# Patient Record
Sex: Male | Born: 1935 | Race: Black or African American | Hispanic: No | Marital: Married | Smoking: Current every day smoker
Health system: Southern US, Community
[De-identification: ages and names within clinical notes are randomized; demographics above are authoritative.]

## PROBLEM LIST (undated history)

## (undated) DIAGNOSIS — N183 Chronic kidney disease, stage 3 unspecified: Secondary | ICD-10-CM

## (undated) DIAGNOSIS — I714 Abdominal aortic aneurysm, without rupture, unspecified: Secondary | ICD-10-CM

## (undated) DIAGNOSIS — I10 Essential (primary) hypertension: Secondary | ICD-10-CM

## (undated) DIAGNOSIS — T148XXA Other injury of unspecified body region, initial encounter: Secondary | ICD-10-CM

## (undated) DIAGNOSIS — D649 Anemia, unspecified: Secondary | ICD-10-CM

## (undated) DIAGNOSIS — K219 Gastro-esophageal reflux disease without esophagitis: Secondary | ICD-10-CM

## (undated) DIAGNOSIS — R0602 Shortness of breath: Secondary | ICD-10-CM

## (undated) DIAGNOSIS — K639 Disease of intestine, unspecified: Secondary | ICD-10-CM

## (undated) DIAGNOSIS — D509 Iron deficiency anemia, unspecified: Principal | ICD-10-CM

## (undated) DIAGNOSIS — L0390 Cellulitis, unspecified: Secondary | ICD-10-CM

## (undated) DIAGNOSIS — K769 Liver disease, unspecified: Secondary | ICD-10-CM

## (undated) DIAGNOSIS — R55 Syncope and collapse: Secondary | ICD-10-CM

## (undated) DIAGNOSIS — Z9289 Personal history of other medical treatment: Secondary | ICD-10-CM

## (undated) DIAGNOSIS — J449 Chronic obstructive pulmonary disease, unspecified: Secondary | ICD-10-CM

## (undated) DIAGNOSIS — J189 Pneumonia, unspecified organism: Secondary | ICD-10-CM

## (undated) DIAGNOSIS — I739 Peripheral vascular disease, unspecified: Secondary | ICD-10-CM

## (undated) DIAGNOSIS — D696 Thrombocytopenia, unspecified: Secondary | ICD-10-CM

## (undated) DIAGNOSIS — E785 Hyperlipidemia, unspecified: Secondary | ICD-10-CM

## (undated) HISTORY — DX: Abdominal aortic aneurysm, without rupture, unspecified: I71.40

## (undated) HISTORY — DX: Hyperlipidemia, unspecified: E78.5

## (undated) HISTORY — DX: Thrombocytopenia, unspecified: D69.6

## (undated) HISTORY — DX: Abdominal aortic aneurysm, without rupture: I71.4

## (undated) HISTORY — DX: Anemia, unspecified: D64.9

## (undated) HISTORY — PX: PR VEIN BYPASS GRAFT,AORTO-FEM-POP: 35551

## (undated) HISTORY — DX: Iron deficiency anemia, unspecified: D50.9

---

## 1959-02-27 DIAGNOSIS — K769 Liver disease, unspecified: Secondary | ICD-10-CM

## 1959-02-27 HISTORY — DX: Liver disease, unspecified: K76.9

## 2011-03-08 ENCOUNTER — Inpatient Hospital Stay (HOSPITAL_COMMUNITY)
Admission: EM | Admit: 2011-03-08 | Discharge: 2011-03-18 | DRG: 560 | Disposition: A | Discharge status: Home / Self Care | Payer: BC Managed Care – PPO | Attending: Family Medicine | Admitting: Family Medicine

## 2011-03-08 ENCOUNTER — Emergency Department (HOSPITAL_COMMUNITY): Payer: BC Managed Care – PPO

## 2011-03-08 ENCOUNTER — Other Ambulatory Visit (HOSPITAL_COMMUNITY): Payer: Self-pay

## 2011-03-08 ENCOUNTER — Encounter (HOSPITAL_COMMUNITY): Payer: Self-pay | Admitting: Radiology

## 2011-03-08 DIAGNOSIS — I70219 Atherosclerosis of native arteries of extremities with intermittent claudication, unspecified extremity: Secondary | ICD-10-CM | POA: Diagnosis present

## 2011-03-08 DIAGNOSIS — I723 Aneurysm of iliac artery: Secondary | ICD-10-CM | POA: Diagnosis present

## 2011-03-08 DIAGNOSIS — D61818 Other pancytopenia: Secondary | ICD-10-CM | POA: Diagnosis present

## 2011-03-08 DIAGNOSIS — N179 Acute kidney failure, unspecified: Secondary | ICD-10-CM | POA: Diagnosis not present

## 2011-03-08 DIAGNOSIS — F17200 Nicotine dependence, unspecified, uncomplicated: Secondary | ICD-10-CM | POA: Diagnosis present

## 2011-03-08 DIAGNOSIS — N182 Chronic kidney disease, stage 2 (mild): Secondary | ICD-10-CM | POA: Diagnosis present

## 2011-03-08 DIAGNOSIS — I129 Hypertensive chronic kidney disease with stage 1 through stage 4 chronic kidney disease, or unspecified chronic kidney disease: Secondary | ICD-10-CM | POA: Diagnosis present

## 2011-03-08 DIAGNOSIS — M7981 Nontraumatic hematoma of soft tissue: Principal | ICD-10-CM | POA: Diagnosis present

## 2011-03-08 DIAGNOSIS — I714 Abdominal aortic aneurysm, without rupture, unspecified: Secondary | ICD-10-CM | POA: Diagnosis present

## 2011-03-08 DIAGNOSIS — IMO0002 Reserved for concepts with insufficient information to code with codable children: Secondary | ICD-10-CM | POA: Diagnosis present

## 2011-03-08 LAB — POCT I-STAT, CHEM 8
BUN: 20 mg/dL (ref 6–23)
Calcium, Ion: 1.18 mmol/L (ref 1.12–1.32)
Chloride: 108 mEq/L (ref 96–112)
Creatinine, Ser: 1.4 mg/dL — ABNORMAL HIGH (ref 0.50–1.35)
Glucose, Bld: 85 mg/dL (ref 70–99)
HCT: 37 % — ABNORMAL LOW (ref 39.0–52.0)
Hemoglobin: 12.6 g/dL — ABNORMAL LOW (ref 13.0–17.0)
Potassium: 3.8 meq/L (ref 3.5–5.1)
Sodium: 142 meq/L (ref 135–145)
TCO2: 22 mmol/L (ref 0–100)

## 2011-03-08 LAB — BASIC METABOLIC PANEL WITH GFR
CO2: 23 meq/L (ref 19–32)
Chloride: 105 meq/L (ref 96–112)
Glucose, Bld: 81 mg/dL (ref 70–99)
Potassium: 3.9 meq/L (ref 3.5–5.1)
Sodium: 141 meq/L (ref 135–145)

## 2011-03-08 LAB — BASIC METABOLIC PANEL
BUN: 18 mg/dL (ref 6–23)
Calcium: 9.7 mg/dL (ref 8.4–10.5)
Creatinine, Ser: 1.25 mg/dL (ref 0.50–1.35)
GFR calc Af Amer: >60 mL/min (ref >60)
GFR calc non Af Amer: 57 mL/min — ABNORMAL LOW (ref >60)

## 2011-03-08 LAB — CBC
HCT: 30.8 % — ABNORMAL LOW (ref 39.0–52.0)
Hemoglobin: 11 g/dL — ABNORMAL LOW (ref 13.0–17.0)
MCH: 28.7 pg (ref 26.0–34.0)
MCHC: 35.7 g/dL (ref 30.0–36.0)
MCV: 80.4 fL (ref 78.0–100.0)
Platelets: 100 10*3/uL — ABNORMAL LOW (ref 150–400)
RBC: 3.83 MIL/uL — ABNORMAL LOW (ref 4.22–5.81)
RDW: 13.2 % (ref 11.5–15.5)
WBC: 6.8 K/uL (ref 4.0–10.5)

## 2011-03-08 LAB — DIFFERENTIAL
Basophils Absolute: 0 K/uL (ref 0.0–0.1)
Basophils Relative: 0 % (ref 0–1)
Eosinophils Absolute: 0.1 10*3/uL (ref 0.0–0.7)
Eosinophils Relative: 1 % (ref 0–5)
Lymphocytes Relative: 31 % (ref 12–46)
Lymphs Abs: 2.1 10*3/uL (ref 0.7–4.0)
Monocytes Absolute: 0.6 K/uL (ref 0.1–1.0)
Monocytes Relative: 9 % (ref 3–12)
Neutro Abs: 3.9 K/uL (ref 1.7–7.7)
Neutrophils Relative %: 58 % (ref 43–77)

## 2011-03-08 LAB — SEDIMENTATION RATE: Sed Rate: 29 mm/hr — ABNORMAL HIGH (ref 0–16)

## 2011-03-08 MED ORDER — IOHEXOL 300 MG/ML  SOLN
100.0000 mL | Freq: Once | INTRAMUSCULAR | Status: AC | PRN
  Administered 2011-03-08: 100 mL via INTRAVENOUS

## 2011-03-09 ENCOUNTER — Inpatient Hospital Stay (HOSPITAL_COMMUNITY): Payer: BC Managed Care – PPO

## 2011-03-09 LAB — CBC
MCH: 28.2 pg (ref 26.0–34.0)
MCHC: 35 g/dL (ref 30.0–36.0)
Platelets: 88 10*3/uL — ABNORMAL LOW (ref 150–400)
RDW: 13.4 % (ref 11.5–15.5)

## 2011-03-09 LAB — BASIC METABOLIC PANEL
Calcium: 9 mg/dL (ref 8.4–10.5)
GFR calc Af Amer: 60 mL/min — ABNORMAL LOW (ref >60)
GFR calc non Af Amer: 49 mL/min — ABNORMAL LOW (ref >60)
Glucose, Bld: 82 mg/dL (ref 70–99)
Sodium: 142 mEq/L (ref 135–145)

## 2011-03-09 LAB — MRSA PCR SCREENING: MRSA by PCR: NEGATIVE

## 2011-03-09 MED ORDER — GADOBENATE DIMEGLUMINE 529 MG/ML IV SOLN
15.0000 mL | Freq: Once | INTRAVENOUS | Status: AC
  Administered 2011-03-09: 15 mL via INTRAVENOUS

## 2011-03-10 ENCOUNTER — Inpatient Hospital Stay (HOSPITAL_COMMUNITY): Payer: BC Managed Care – PPO

## 2011-03-10 DIAGNOSIS — D689 Coagulation defect, unspecified: Secondary | ICD-10-CM

## 2011-03-10 DIAGNOSIS — D509 Iron deficiency anemia, unspecified: Secondary | ICD-10-CM

## 2011-03-10 LAB — PROTIME-INR

## 2011-03-10 LAB — CBC
Hemoglobin: 9.8 g/dL — ABNORMAL LOW (ref 13.0–17.0)
MCH: 28.2 pg (ref 26.0–34.0)
MCHC: 34.8 g/dL (ref 30.0–36.0)
RDW: 13.3 % (ref 11.5–15.5)

## 2011-03-10 LAB — COMPREHENSIVE METABOLIC PANEL
Alkaline Phosphatase: 68 U/L (ref 39–117)
BUN: 15 mg/dL (ref 6–23)
Chloride: 107 mEq/L (ref 96–112)
GFR calc Af Amer: 54 mL/min — ABNORMAL LOW (ref >60)
Glucose, Bld: 94 mg/dL (ref 70–99)
Potassium: 4 mEq/L (ref 3.5–5.1)
Total Bilirubin: 0.5 mg/dL (ref 0.3–1.2)
Total Protein: 6.6 g/dL (ref 6.0–8.3)

## 2011-03-10 LAB — IRON AND TIBC
Saturation Ratios: 17 % — ABNORMAL LOW (ref 20–55)
TIBC: 172 ug/dL — ABNORMAL LOW (ref 215–435)

## 2011-03-10 LAB — CALCIUM, IONIZED: Calcium, Ion: 1.27 mmol/L (ref 1.12–1.32)

## 2011-03-10 LAB — SAVE SMEAR

## 2011-03-10 LAB — APTT: aPTT: 40 seconds — ABNORMAL HIGH (ref 24–37)

## 2011-03-10 LAB — TSH: TSH: 0.383 u[IU]/mL (ref 0.350–4.500)

## 2011-03-10 MED ORDER — IOHEXOL 300 MG/ML  SOLN
100.0000 mL | Freq: Once | INTRAMUSCULAR | Status: AC | PRN
  Administered 2011-03-10: 100 mL via INTRAVENOUS

## 2011-03-11 ENCOUNTER — Inpatient Hospital Stay (HOSPITAL_COMMUNITY): Payer: BC Managed Care – PPO

## 2011-03-11 LAB — CBC
Hemoglobin: 9.6 g/dL — ABNORMAL LOW (ref 13.0–17.0)
MCH: 28.2 pg (ref 26.0–34.0)
MCV: 80.6 fL (ref 78.0–100.0)
Platelets: 96 10*3/uL — ABNORMAL LOW (ref 150–400)
RBC: 3.4 MIL/uL — ABNORMAL LOW (ref 4.22–5.81)

## 2011-03-11 LAB — COMPREHENSIVE METABOLIC PANEL
BUN: 16 mg/dL (ref 6–23)
CO2: 26 mEq/L (ref 19–32)
Chloride: 104 mEq/L (ref 96–112)
Creatinine, Ser: 1.53 mg/dL — ABNORMAL HIGH (ref 0.50–1.35)
GFR calc Af Amer: 54 mL/min — ABNORMAL LOW (ref >60)
GFR calc non Af Amer: 45 mL/min — ABNORMAL LOW (ref >60)
Glucose, Bld: 89 mg/dL (ref 70–99)
Total Bilirubin: 0.6 mg/dL (ref 0.3–1.2)

## 2011-03-11 LAB — DIFFERENTIAL
Basophils Relative: 1 % (ref 0–1)
Eosinophils Absolute: 0.3 10*3/uL (ref 0.0–0.7)
Lymphocytes Relative: 21 % (ref 12–46)
Neutro Abs: 5 10*3/uL (ref 1.7–7.7)

## 2011-03-11 LAB — PATHOLOGIST SMEAR REVIEW: Tech Review: NORMAL

## 2011-03-12 ENCOUNTER — Inpatient Hospital Stay (HOSPITAL_COMMUNITY): Payer: BC Managed Care – PPO

## 2011-03-12 DIAGNOSIS — Z01810 Encounter for preprocedural cardiovascular examination: Secondary | ICD-10-CM

## 2011-03-12 DIAGNOSIS — I714 Abdominal aortic aneurysm, without rupture: Secondary | ICD-10-CM

## 2011-03-12 DIAGNOSIS — I739 Peripheral vascular disease, unspecified: Secondary | ICD-10-CM

## 2011-03-12 LAB — PROTEIN ELECTROPH W RFLX QUANT IMMUNOGLOBULINS
Alpha-1-Globulin: 6.3 % — ABNORMAL HIGH (ref 2.9–4.9)
Alpha-2-Globulin: 13.9 % — ABNORMAL HIGH (ref 7.1–11.8)
Beta Globulin: 4.2 % — ABNORMAL LOW (ref 4.7–7.2)
M-Spike, %: NOT DETECTED g/dL
Total Protein ELP: 6.2 g/dL (ref 6.0–8.3)

## 2011-03-12 LAB — BASIC METABOLIC PANEL
BUN: 14 mg/dL (ref 6–23)
Calcium: 9.1 mg/dL (ref 8.4–10.5)
Creatinine, Ser: 1.37 mg/dL — ABNORMAL HIGH (ref 0.50–1.35)
GFR calc Af Amer: >60 mL/min (ref >60)

## 2011-03-12 LAB — CBC
MCH: 28.1 pg (ref 26.0–34.0)
MCV: 81.3 fL (ref 78.0–100.0)
Platelets: 114 10*3/uL — ABNORMAL LOW (ref 150–400)
RDW: 13.5 % (ref 11.5–15.5)

## 2011-03-12 LAB — OCCULT BLOOD X 1 CARD TO LAB, STOOL: Fecal Occult Bld: NEGATIVE

## 2011-03-12 LAB — FACTOR 8 ASSAY: Coagulation Factor VIII: 179 % — ABNORMAL HIGH (ref 73–140)

## 2011-03-12 LAB — PROTIME-INR: Prothrombin Time: 14.1 seconds (ref 11.6–15.2)

## 2011-03-13 ENCOUNTER — Other Ambulatory Visit (HOSPITAL_COMMUNITY): Payer: BC Managed Care – PPO

## 2011-03-13 ENCOUNTER — Inpatient Hospital Stay (HOSPITAL_COMMUNITY): Payer: BC Managed Care – PPO

## 2011-03-13 DIAGNOSIS — I10 Essential (primary) hypertension: Secondary | ICD-10-CM

## 2011-03-13 DIAGNOSIS — R58 Hemorrhage, not elsewhere classified: Secondary | ICD-10-CM

## 2011-03-13 LAB — CBC
Hemoglobin: 10.1 g/dL — ABNORMAL LOW (ref 13.0–17.0)
MCH: 27.8 pg (ref 26.0–34.0)
MCHC: 34.1 g/dL (ref 30.0–36.0)
RDW: 13.3 % (ref 11.5–15.5)

## 2011-03-13 LAB — GLUCOSE, CAPILLARY: Glucose-Capillary: 201 mg/dL — ABNORMAL HIGH (ref 70–99)

## 2011-03-13 MED ORDER — TECHNETIUM TC 99M TETROFOSMIN IV KIT
30.0000 | PACK | Freq: Once | INTRAVENOUS | Status: AC | PRN
  Administered 2011-03-13: 30 via INTRAVENOUS

## 2011-03-13 MED ORDER — TECHNETIUM TC 99M TETROFOSMIN IV KIT
10.0000 | PACK | Freq: Once | INTRAVENOUS | Status: AC | PRN
  Administered 2011-03-13: 10 via INTRAVENOUS

## 2011-03-14 DIAGNOSIS — D649 Anemia, unspecified: Secondary | ICD-10-CM

## 2011-03-14 LAB — COMPREHENSIVE METABOLIC PANEL
ALT: 11 U/L (ref 0–53)
AST: 19 U/L (ref 0–37)
Albumin: 3.2 g/dL — ABNORMAL LOW (ref 3.5–5.2)
Calcium: 9.3 mg/dL (ref 8.4–10.5)
Chloride: 102 mEq/L (ref 96–112)
Creatinine, Ser: 1.34 mg/dL (ref 0.50–1.35)
Sodium: 139 mEq/L (ref 135–145)

## 2011-03-14 LAB — CBC
MCH: 28.6 pg (ref 26.0–34.0)
MCV: 82.1 fL (ref 78.0–100.0)
Platelets: 134 10*3/uL — ABNORMAL LOW (ref 150–400)
RBC: 3.74 MIL/uL — ABNORMAL LOW (ref 4.22–5.81)
RDW: 13.1 % (ref 11.5–15.5)
WBC: 6.9 10*3/uL (ref 4.0–10.5)

## 2011-03-14 LAB — PROTIME-INR: INR: 0.97 (ref 0.00–1.49)

## 2011-03-14 LAB — APTT: aPTT: 33 seconds (ref 24–37)

## 2011-03-14 NOTE — Progress Notes (Signed)
NAMECRISTOFER, YAFFE NO.:  [id number]  MEDICAL RECORD NO.:  [id number]  LOCATION:  3039                         FACILITY:  [facility]  PHYSICIAN:  Conley Canal, MD      DATE OF BIRTH:  September 25, 1934                                PROGRESS NOTE   PRIMARY CARE PHYSICIAN: None.  CONSULTING PHYSICIANS: 1. Reece Packer, MD 2. Madolyn Frieze Jens Som, MD, [facility] 3. Fransisco Hertz, MD 4. [name], PA  PROBLEM LIST: 1. Spontaneous bleed into right forearm. 2. Thrombocytopenia. 3. Anemia with question of bleeding tendency. 4. Infrarenal abdominal aortic aneurysm 7.5 cm largest diameter. 5. Malignant hypertension. 6. Iron-deficiency and vitamin B12 deficiency anemia.  PROCEDURES PERFORMED: 1. Myoview on March 13, 2011, result pending. 2. CT abdomen and pelvis on March 10, 2011, showing multiple     calcified gallstones in contracted gallbladder and large infrarenal     abdominal aortic aneurysm without evidence of acute bleeding,     marked small transverse diameter 7.5 cm and occlusion of the right     iliac artery. 3. MRI and CT of right forearm showing heterogeneous mass like signal     in the flexor carpi radialis muscle, proximally favoring hematoma     of muscle abscess, also possibility of cellulitis. 4. Chest x-ray on March 11, 2011, showed pulmonary hyperinflation     and probable chronic interstitial lung changes.  No acute     cardiopulmonary abnormality.  HOSPITAL COURSE: Mr. Vosler is a pleasant 76 year old male who has not seen doctors in more than 10 years who has remote history of alcoholism, who came in with complaints of right arm pain and swelling, not preceded by trauma. The patient mentions that he does bowling regularly.  He was found to have spontaneous hematoma of the same arm with suggestion of cellulitis. The patient was seen by ortho hand who felt that the spontaneous hematoma should eventually resolve on its on.  The patient  was also found to be thrombocytopenia and this raises concern for some bleeding tendency and Hematology has been diligently evaluating the patient. Because of concern for possible liver cirrhosis or portal hypertension, a CAT scan of the belly was performed and this incidentally showed an infrarenal abdominal aortic aneurysm of largest diameter 7.5 cm resulting in vascular surgery consult and Dr. [name], has been waking up. He recommends surgical intervention.  He also requested cardiology consult for risk stratification and the patient had a Myoview today, results pending.  He was also found to be hypertensive and he has been on beta blocker.  Otherwise, his right arm has made some progress less swollen, but still not completely resolved.  The platelet count has slowly improved from a lowest level of 84 on March 10, 2011, when the patient was taken off aspirin and Lovenox to 116 today.  He had an anemia panel, which suggested iron deficiency and vitamin B12 deficiency.  Stool Hemoccult has been negative.  He has some renal insufficiency with a BUN of 14, creatinine 1.37 on March 12, 2011. Today, he feels okay.  Denies any complaints.  PHYSICAL EXAMINATION: VITAL SIGNS:  Blood pressure 134/71,  heart rate is 55, temperature 98.3, respiration is 16, and oxygen saturation is 96% on room air. HEAD, EARS, NOSE, AND THROAT:  No jugular venous distention.  No carotid bruits. RESPIRATORY:  Lungs clear. CARDIOVASCULAR SYSTEM:  Second heart sounds heard.  No murmurs.  Pulse regular. ABDOMEN:  Soft and nontender.  No bruits.  Bowel sounds normal. CENTRAL NERVOUS SYSTEM:  Grossly intact. EXTREMITIES:  Right forearm still somewhat swollen and less inflamed.  LABORATORY DATA: White count 7.7, hemoglobin 10.1, hematocrit 29.6, and platelet count 116.  PLAN: 1. Right arm hematoma, stroke, and cellulitis seems to be resolving.     If concern for slow resolution, consider reconsulting  ortho hand.     The patient taken off anticoagulants because of the hematoma. 2. Infrarenal abdominal aortic aneurysm.  Appreciate vascular surgery     input.  The patient to have arthrogram on March 15, 2011.     Myoview results pending.  Appreciate Cardiology input. 3. Iron-deficiency anemia, vitamin B12 deficiency, thrombocytopenia     with question of bleeding tendency.  Appreciate Hematology/Oncology     input.  The patient on iron and vitamin B12 supplements. 4. Hypertension.  The patient on beta-blocker, especially in view of     abdominal aortic aneurysm. 5. Given peripheral vascular disease.  The patient should be ideally     on antiplatelet agents, which are on hold because of the right     upper arm hematoma. 6. Disposition will depend on Vascular Surgery. 7. The patient should be set up with primary care for healthcare     maintenance at discharge.     Conley Canal, MD     SR/MEDQ  D:  03/13/2011  T:  03/13/2011  Job:  [id number]  Electronically Signed by Conley Canal  on 03/14/2011 03:57:22 PM

## 2011-03-15 DIAGNOSIS — I714 Abdominal aortic aneurysm, without rupture: Secondary | ICD-10-CM

## 2011-03-15 LAB — UIFE/LIGHT CHAINS/TP QN, 24-HR UR
Albumin, U: DETECTED
Alpha 2, Urine: DETECTED — AB
Beta, Urine: DETECTED — AB
Free Lambda Lt Chains,Ur: 3.53 mg/dL — ABNORMAL HIGH (ref 0.02–0.67)
Gamma Globulin, Urine: DETECTED — AB
Time: 24 hours

## 2011-03-15 LAB — BASIC METABOLIC PANEL
BUN: 23 mg/dL (ref 6–23)
CO2: 29 mEq/L (ref 19–32)
Calcium: 9.6 mg/dL (ref 8.4–10.5)
Chloride: 104 mEq/L (ref 96–112)
Creatinine, Ser: 1.51 mg/dL — ABNORMAL HIGH (ref 0.50–1.35)
GFR calc Af Amer: 55 mL/min — ABNORMAL LOW (ref >60)
GFR calc non Af Amer: 46 mL/min — ABNORMAL LOW (ref >60)
Glucose, Bld: 97 mg/dL (ref 70–99)
Potassium: 4.5 mEq/L (ref 3.5–5.1)
Sodium: 140 mEq/L (ref 135–145)

## 2011-03-15 LAB — CBC
HCT: 29.9 % — ABNORMAL LOW (ref 39.0–52.0)
Hemoglobin: 10.4 g/dL — ABNORMAL LOW (ref 13.0–17.0)
RBC: 3.67 MIL/uL — ABNORMAL LOW (ref 4.22–5.81)

## 2011-03-15 LAB — KAPPA/LAMBDA LIGHT CHAINS: Kappa free light chain: 6.02 mg/dL — ABNORMAL HIGH (ref 0.33–1.94)

## 2011-03-15 NOTE — Consult Note (Signed)
[name], RETTER NO.:  [id number]  MEDICAL RECORD NO.:  [id number]  LOCATION:  3039                         FACILITY:  [facility]  PHYSICIAN:  Fransisco Hertz, MD       DATE OF BIRTH:  Oct 26, 1934  DATE OF CONSULTATION:  03/11/2011 DATE OF DISCHARGE:                                CONSULTATION   REASON FOR CONSULTATION:  Abdominal aortic aneurysm.  This is being requested by Dr. Venetia Constable.  HISTORY OF PRESENT ILLNESS:  This is a 76 year old gentleman who presented with chief complaint of right arm swelling and pain.  He apparently on his workup was found to have a right forearm hematoma, the etiology of which is unknown.  During his workup, he was found to have anemia and thrombocytopenia, which on the hematology workup then led to a question of prior history of alcoholism and question of possible cirrhosis.  This led to abdomen and pelvis CT, which incidentally found a large aneurysm on this scan.  The patient denies any history of back or abdominal pain.  There was no family history of aneurysmal disease or any type of connective tissue disorder. His risk factors were aneurysm include smoking history greater than 40- pack-year history, age greater than 21 and male sex.  Additionally, the patient notes no prior history of any type of claudication or rest pain symptoms in either leg.  PAST MEDICAL HISTORY:  The patient notes a possible history of gout previously, history of alcoholism.  PAST SURGICAL HISTORY:  History of liver biopsies.  He previously had lymph node or excisions in the right neck.  SOCIAL HISTORY:  Greater than 40-pack-year history of smoking, previous heavy alcohol use, but has not had a drink in 15 years.  Denies any illicit drug use.  FAMILY HISTORY:  Neither mother nor father had any aneurysmal disease. Mom and dad did not have any significant family history, one of his brothers did have gout.  REVIEW OF SYSTEMS:  Was as noted above,  otherwise was noted to be negative.  PHYSICAL EXAMINATION:  VITAL SIGNS:  He had a temperature of 98.8, a blood pressure 127/75, a heart rate of 61, respirations are 16, 93% on room air. GENERAL:  Well developed, well nourished.  No apparent distress, appears his stated age. HEENT:  Head:  Normocephalic, atraumatic.  ENT:  Hearing is grossly intact.  Oropharynx without any erythema or exudate.  The nares are without any drainage or any erythema.  Eye:  Pupils were equal, round, and reactive to light.  Extraocular movements were intact. NECK:  Supple neck without any nuchal rigidity.  No obvious lymphadenopathy. PULMONARY:  Symmetric expansion.  Good air movement.  There were no rales, rhonchi, or wheezing. CARDIAC:  Regular rate and rhythm.  Normal S1-S2.  No murmurs, rubs, thrills, or gallops auscultated. VASCULAR:  Easily palpable radial pulses.  Both brachials were palpable. Carotids were easily palpable without any bruits.  The abdominal aorta could be palpated in the midline more prominently distally.  His left femoral pulse had a strong pulse, right one faintly had a pulse neither extremities had palpable popliteal or pedal pulses. ABDOMEN:  He has a  good musculature in the anterior abdomen.  Soft abdomen, otherwise nontender, nondistended, no guarding, no rebound. There is a palpable pulsatile masses more prominent in the lower portion of the abdomen.  No obvious masses, no guarding, no rebound, no hepatosplenomegaly. MUSCULOSKELETAL:  He had 5/5 strength in all extremities.  He is got obvious clubbing in his fingertips.  In his right forearm, there is obvious dense hematoma here with ecchymoses draining into the dependent portions of the left upper arm; however, has full motion of this right upper arm and did not note any ischemic changes in either feet. NEUROLOGIC:  His cranial nerves II through XII are intact.  Motor exam was as above.  Sensation is grossly intact in all  extremities including the right upper arm. PSYCHIATRY:  Judgment was intact.  Mood and affect were appropriate for his clinical situation. SKIN:  No obvious rashes anywhere noted.  Extremities were as listed above. LYMPHATIC:  No cervical, axillary, inguinal lymphadenopathy were noted.  LABORATORY STUDIES:  His most recent CBC; white count 8.0 with H and H of 9.6 and 27.4, platelet count of 96.  He had a chemistries; sodium 134, potassium 4.2, chloride 104, bicarb of 26, glucose 89, BUN 16, creatinine is 1.53.  His total bilirubin was 0.6, alkaline phosphatase 63, AST was 14, ALT 5, total protein 6.5, albumin 2.7, calcium is 8.8. He had a number of other hematologic studies, which the final interpretation is pending.  Hem/Onc evaluation also reviewed independently.    RADIOLOGY: His CT of abdomen and pelvis with p.o. and IV contrast demonstrates no obvious neoplasms in it.  There is an abdominal aortic aneurysm with 5.6-6 cm estimated of size.  There are no 3D reconstruction, so true orthogonal measurement cannot be obtained. 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  The right external iliac artery appears to reconstitute distally.  There was flow signature in the right common femoral artery, appears to be a patent possible left internal iliac artery.  I do not see obvious one on the right side; however, this evaluation is complicated by the presence of p.o. contrast.  He also has an incarcerated hernia in his right scrotum based on the CT scan.  Based on the geometry, I see he has an acceptable neck for endostent; however, it may be at the higher end of the traditionally available endostent for abdominal aneurysm.  Then, use of a thoracic endostent may be necessary in this patient.  The two renal arteries are roughly about the same level with the low right one being slightly lower.  MEDICAL DECISION MAKING:  This is a  76 year old gentleman with anemia and thrombocytopenia of unknown etiology.  The anemia may be mainly due to iron deficiency.  The workup is incomplete at this point, and the thrombo- cytopenia workup is also in progress.  Hem/Onc has already been consulted  by the primary team for this evaluation.  This patient has a large abdominal  aortic aneurysm and meets criteria for repair, size greater than 5.5 cn,  Based on the size of his AAA, he has a 10-20% mortality rate per year due to the aneurysm and subsequently, justification for proceeding with repair via traditional open repair versus endovascular repair is being considered. Part of the complication, I cannot see some of the anatomy with the p.o. contrast is present.  Subsequently, I recommend prior to making final decision.  We should allow the contrast in his bowel to  completely clear and then perform an aortogram to fully image the vasculature including internal iliac artery.  This is an issue due to the patent left common iliac artery aneurysm that will need to be addressed as part of any repair whether open or endovascular.  Additionally, it appears to be a 90 degrees turn on the CT reconstruction in the right common iliac artery.  This may or may not make it impossible to the pass the endograft.  Overall, he has limited comorbidities, but traditionally these large aneurysms have been associated with substantial cardiac risk.  Subsequently, preoperative optimization and risk stratification should be obtained prior to proceeding with any type for repair on this gentleman.  His repair does not need to be done on an emergent basis and I recommended; however, the repair would be completed within the next 2-4 weeks.  The patient is aware of this suggestion. Additionally, I would be obtain screening ABIs for lower extremities to determine what is baseline bilateral lower extremity and peripheral vascular disease is.  Especially as the  right common iliac artery is occluded, though he remains asymptomatic from that.  We will continue to follow along with this patient and once his p.o. contrast in his bowels have past, I will arrange for the aortogram to fully evaluate his anatomy and then proceed with repair as indicated.     Fransisco Hertz, MD     BLC/MEDQ  D:  03/11/2011  T:  03/11/2011  Job:  [id number]  Electronically Signed by [name] MD on 03/15/2011 12:44:29 PM

## 2011-03-16 DIAGNOSIS — E538 Deficiency of other specified B group vitamins: Secondary | ICD-10-CM

## 2011-03-16 DIAGNOSIS — D689 Coagulation defect, unspecified: Secondary | ICD-10-CM

## 2011-03-16 DIAGNOSIS — I714 Abdominal aortic aneurysm, without rupture: Secondary | ICD-10-CM

## 2011-03-16 DIAGNOSIS — D509 Iron deficiency anemia, unspecified: Secondary | ICD-10-CM

## 2011-03-16 LAB — C-REACTIVE PROTEIN: CRP: 1.49 mg/dL — ABNORMAL HIGH (ref <0.60)

## 2011-03-16 LAB — BASIC METABOLIC PANEL
CO2: 27 mEq/L (ref 19–32)
Calcium: 8.9 mg/dL (ref 8.4–10.5)
Chloride: 107 mEq/L (ref 96–112)
Creatinine, Ser: 1.3 mg/dL (ref 0.50–1.35)
GFR calc Af Amer: >60 mL/min (ref >60)
Sodium: 141 mEq/L (ref 135–145)

## 2011-03-16 LAB — VON WILLEBRAND FACTOR MULTIMER
Factor-VIII Activity: 170 % (ref 50–180)
Ristocetin Co-Factor: 187 % (ref 42–200)

## 2011-03-16 LAB — IGG: IgG (Immunoglobin G), Serum: 1450 mg/dL (ref 650–1600)

## 2011-03-16 LAB — IMMUNOFIXATION ELECTROPHORESIS: IgM, Serum: 68 mg/dL (ref 41–251)

## 2011-03-16 LAB — CBC
Hemoglobin: 9.7 g/dL — ABNORMAL LOW (ref 13.0–17.0)
Platelets: DECREASED 10*3/uL (ref 150–400)
RBC: 3.39 MIL/uL — ABNORMAL LOW (ref 4.22–5.81)

## 2011-03-16 LAB — ALBUMIN: Albumin: 2.8 g/dL — ABNORMAL LOW (ref 3.5–5.2)

## 2011-03-16 LAB — PREALBUMIN: Prealbumin: 20.6 mg/dL (ref 17.0–34.0)

## 2011-03-17 LAB — CBC
HCT: 27.5 % — ABNORMAL LOW (ref 39.0–52.0)
Hemoglobin: 9.7 g/dL — ABNORMAL LOW (ref 13.0–17.0)
MCH: 28.8 pg (ref 26.0–34.0)
MCV: 81.6 fL (ref 78.0–100.0)
Platelets: 117 10*3/uL — ABNORMAL LOW (ref 150–400)
RBC: 3.37 MIL/uL — ABNORMAL LOW (ref 4.22–5.81)
WBC: 6.5 10*3/uL (ref 4.0–10.5)

## 2011-03-17 LAB — BASIC METABOLIC PANEL
BUN: 21 mg/dL (ref 6–23)
CO2: 26 mEq/L (ref 19–32)
Calcium: 9.1 mg/dL (ref 8.4–10.5)
Chloride: 104 mEq/L (ref 96–112)
Creatinine, Ser: 1.41 mg/dL — ABNORMAL HIGH (ref 0.50–1.35)
Glucose, Bld: 89 mg/dL (ref 70–99)

## 2011-03-17 LAB — DIFFERENTIAL
Basophils Relative: 1 % (ref 0–1)
Eosinophils Absolute: 0.3 10*3/uL (ref 0.0–0.7)
Eosinophils Relative: 4 % (ref 0–5)
Lymphocytes Relative: 37 % (ref 12–46)
Neutro Abs: 3 10*3/uL (ref 1.7–7.7)
Neutrophils Relative %: 47 % (ref 43–77)

## 2011-03-17 LAB — RETICULOCYTES: Retic Ct Pct: 1.8 % (ref 0.4–3.1)

## 2011-03-18 ENCOUNTER — Encounter: Payer: BC Managed Care – PPO | Admitting: Oncology

## 2011-03-18 LAB — CBC
HCT: 29 % — ABNORMAL LOW (ref 39.0–52.0)
Hemoglobin: 10 g/dL — ABNORMAL LOW (ref 13.0–17.0)
MCH: 28.1 pg (ref 26.0–34.0)
MCV: 81.5 fL (ref 78.0–100.0)
Platelets: 124 10*3/uL — ABNORMAL LOW (ref 150–400)
RBC: 3.56 MIL/uL — ABNORMAL LOW (ref 4.22–5.81)
WBC: 8.2 10*3/uL (ref 4.0–10.5)

## 2011-03-18 NOTE — Op Note (Signed)
[name], [name] NO.:  [id number]  MEDICAL RECORD NO.:  [id number]  LOCATION:  3039                         FACILITY:  [facility]  PHYSICIAN:  Fransisco Hertz, MD       DATE OF BIRTH:  07-08-1934  DATE OF PROCEDURE: DATE OF DISCHARGE:                              OPERATIVE REPORT   This patient underwent: 1. A left common femoral artery cannulation under ultrasound guidance. 2. Aortogram.  PREOPERATIVE DIAGNOSIS:  Abdominal aortic aneurysm.  POSTOPERATIVE DIAGNOSIS:  Abdominal aortic aneurysm.  SURGEON:  Fransisco Hertz, MD.  ANESTHESIA:  Local.  ESTIMATED BLOOD LOSS:  Minimal.  CONTRAST:  45 mL.  SPECIMENS:  None.  Findings included: 1. A left common iliac artery aneurysm. 2. Patent left internal iliac artery, external iliac artery.  Note,     the left external iliac artery is as small as 3.8 mm at its most     narrow point. 3. Patent aorta with sluggish flow consistent with abdominal aortic     aneurysm. 4. There is a patent celiac axis and superior mesenteric artery. 5. There is a celiac collateral which traversed his abdomen and     reconstitutes the distal right external iliac artery. 6. No inferior mesenteric arteries visualized.  INDICATIONS:  This is a 76 year old gentleman who was admitted to the hospital for right arm hematoma with a previous history of alcoholism. He had undergone a CT scan to evaluate his liver for possible cirrhosis to account for his current thrombocytopenia.  In this process, incidentally, an asymptomatic aortic aneurysm was visualized. Unfortunately, the CT scan was done with p.o. contrast and it obscured some of the anatomic details necessary to determine whether or not this  patient's abdominal aortic aneurysm could be safely repaired through an  endograft technique.  Subsequently, I felt that he would need an aortogram  to help visualize presence of the IMA and also to evaluate the left iliac system.  Also, there  was a 90-degree turn in the left iliac system which  I had concerns whether or not the endograft would be able to navigate easily.   Subsequently, we discussed proceeding with an aortogram to determine these  issues.  The patient is aware the risks of this procedure include bleeding, infection, possible access site complications, possible renal failure, and possible need for additional interventions.  He was aware of these risks and agreed to proceed forward.  DESCRIPTION OF OPERATIVE:  After full informed written consent was obtained from the patient, he was brought back to the operating room, placed supine upon the angio table, was connected to monitoring equipment, and then prepped and draped in standard fashion for aortogram.  I turned my attention to the patient's left groin.  Under ultrasound guidance, I identified the common femoral artery.  This was cannulated under ultrasound guidance with an 18-gauge needle, passed the wire up into the common iliac artery, but it would not make the turn into the aorta.  Also, the dilator would not load over this wire. Subsequently. I had to place an end-hole catheter over this wire and then exchanged the wire out for a Rosen wire which was stiffer.  I then was able to load a 5-French sheath into this artery over that wire and then using a KMP catheter with Bentson wire, was able to get up into the aorta with some difficulty.  Eventually, I was able to traverse the abdominal aortic aneurysm and lodged the wire more proximally.  Over this wire, then a pigtail catheter was loaded and advanced into the supraceliac aorta, then removed the wire.  The catheter was connected to power injector circuit after performing declotting and de-airing maneuver.  I then performed a power injector aortogram, the findings of which are listed above.  I then also did a retrograde injection via the left femoral sheath to image the iliac arterial system, the findings  of which are listed above.  I also decided to do a lateral aortogram to evaluate the presence of the mesenteric arteries, findings of which are listed above.  Based on these findings, I do not think this patient is a good candidate for an endovascular aortic repair as his left external iliac is only 3.7 cm and I do not think this is big enough for delivery of a thoracic endograft that would be necessary to repair this gentleman. Additionally, as he had significant disease here, he likely in the future would still need some type of bypass from the common iliac artery to the femoral artery.  Subsequently, he will need an aortobifemoral repair for his aneurysm somewhat complicated by the persistent flow in the left internal iliac system and the presence of a left common iliac artery aneurysm.  At this point, I replaced the wire and the pigtail catheter.  I removed the wire and pigtail catheter.  The left femoral sheath was aspirated.  There was no clot and it was instilled heparinized saline.  The plan is to pull it in the holding area.  COMPLICATIONS:  None.  CONDITION:  Stable.     Fransisco Hertz, MD     BLC/MEDQ  D:  03/15/2011  T:  03/15/2011  Job:  [id number]  Electronically Signed by [name] MD on 03/18/2011 10:26:30 AM

## 2011-03-22 NOTE — Consult Note (Signed)
NAMEARBER, WIEMERS NO.:  [id number]  MEDICAL RECORD NO.:  [id number]  LOCATION:  3039                         FACILITY:  [facility]  PHYSICIAN:  Madolyn Frieze. Jens Som, MD, FACCDATE OF BIRTH:  02/12/1935  DATE OF CONSULTATION:  03/12/2011 DATE OF DISCHARGE:                                CONSULTATION   The patient is a 76 year old male with past medical history of remote alcohol abuse but none since 1990 and hypertension who I am asked to evaluate preoperatively prior to repair of an abdominal aortic aneurysm. The patient typically has dyspnea with more extreme activities, but not with routine activities.  There is no orthopnea, PND, pedal edema, or exertional chest pain.  He does have claudication in the right lower extremity after walking extended amounts of time.  The patient was admitted on March 08, 2011, with what turns out to be a spontaneous hematoma in his right upper extremity.  He was seen by Hematology because of anemia and thrombocytopenia.  His platelet function assay apparently has been abnormal and he is being evaluated for von Willebrand's and factor VIII deficiency.  He had an abdominal CT as part of his evaluation.  This was performed on March 10, 2011.  There was found to be a large abdominal aortic aneurysm measuring 5.6 x 7.5 cm. It extended over 9 cm.  The iliac arteries also showed mild aneurysmal dilatation on the left with a maximal diameter of 2.1 cm.  The right iliac artery was occluded.  He is scheduled for an arteriogram and potential repair and Cardiology is asked to further evaluate.  His medications at present include: 1. Augmentin 875 mg p.o. b.i.d. 2. Folate 1 mg p.o. daily. 3. Thiamine 100 mg p.o. daily. 4. Lopressor 12.5 mg p.o. b.i.d. 5. Protonix 40 mg p.o. b.i.d. 6. Ferrous gluconate 324 mg p.o. daily. 7. Vitamin B12 1000 mcg p.o. daily. 8. P.r.n. medications.  FAMILY HISTORY:  Negative coronary artery  disease.  SOCIAL HISTORY:  He has a long history of tobacco use.  He has remote history of alcohol abuse, but has not had any since 1990.  PAST MEDICAL HISTORY:  Significant for hypertension, but he denies any diabetes or hyperlipidemia.  He has had previous lymph nodes removed from his neck.  REVIEW OF SYSTEMS:  He denies any headaches, fevers, or chills.  There is no productive cough or hemoptysis.  There is no dysphagia, odynophagia, melena, or hematochezia.  There is no dysuria or hematuria. No rashes or seizure activity.  There is no orthopnea, PND, or pedal edema.  He has had claudication of the right lower extremity.  The remaining systems are negative.  PHYSICAL EXAMINATION:  VITAL SIGNS:  Blood pressure of 100/58 and his temperature is 98.8.  His pulse is 54. GENERAL:  He is well developed, well nourished, in no acute distress. Ge does not appear to be depressed. SKIN:  Warm and dry.  There is peripheral clubbing. HEENT: :  normal with normal eyelids. NECK:  Supple with a normal upstroke bilaterally and no bruits heard. There is no jugular venous distention and I cannot appreciate thyromegaly. CHEST:  Clear to auscultation with normal expansion.  CARDIOVASCULAR:  Irregular rate and rhythm.  There are no murmurs, rubs, or gallops noted. ABDOMEN:  Nontender and nondistended.  Positive bowel sounds.  No hepatosplenomegaly.  There is a questionable pulsatile mass on exam and there is no abdominal bruit.  He has 2+ femoral pulse on the left and the right femoral pulses not palpable.  I cannot appreciate bruits. EXTREMITIES:  No edema and I can palpate no cords.  His distal pulses are diminished. NEUROLOGIC:  Grossly intact.  LABORATORY DATA:  Sodium of 139 with a potassium of 4.0.  BUN and creatinine are 14 and 1.37.  White blood cell count is 7.9 with a hemoglobin of 10.8 and a hematocrit of 31.3.  His MCV is 81.3.  His platelet count is 114.  TSH is normal at 0.383.  Chest  x-ray from March 11, 2011, shows chronic interstitial lung changes. Electrocardiogram is not available to review.  DIAGNOSES: 1. Preoperative evaluation prior to abdominal aortic aneurysm repair -     the patient does have risk factors including hypertension and a     long history of tobacco use.  He also has clear evidence of     vascular disease with his abdominal aortic aneurysm and occluded     right femoral artery.  He does need risk stratification prior to     his surgery.  We will plan [facility] tomorrow morning.  This     will be complicated if abnormal by his bleeding disorder.  He     presented with a spontaneous hematoma of his right upper extremity.     He also has thrombocytopenia and apparently an abnormal platelet     assay which is being evaluated by Hematology/Oncology.  This will     need to be better defined and his risk of bleeding will be outlined     by Hematology before any other intervention could be considered.     Hopefully, his Myoview will be normal and a cardiac catheterization     would not be required.  Note any PCI would require aspirin and     Plavix.  This would be difficult with his recent spontaneous     hematoma and thrombocytopenia.  We would recommend continuing     Lopressor 12.5 mg p.o. b.i.d. 2. Hypertension - his blood pressure appears to be controlled at     present. 3. Abdominal aortic aneurysm - he is being followed by Vascular     Surgery.  Arteriogram is planned for Monday. 4. Tobacco abuse - we discussed importance of discontinuing this.     Madolyn Frieze Jens Som, MD, [facility]     BSC/MEDQ  D:  03/12/2011  T:  03/12/2011  Job:  [id number]  Electronically Signed by Olga Millers MD [facility] on 03/22/2011 04:06:29 PM

## 2011-03-25 ENCOUNTER — Encounter (HOSPITAL_COMMUNITY)
Admission: RE | Admit: 2011-03-25 | Discharge: 2011-03-25 | Disposition: A | Discharge status: Home / Self Care | Payer: BC Managed Care – PPO | Source: Ambulatory Visit | Attending: Vascular Surgery | Admitting: Vascular Surgery

## 2011-03-25 LAB — BLOOD GAS, ARTERIAL
Acid-base deficit: 1.5 mmol/L (ref 0.0–2.0)
Bicarbonate: 22.9 mEq/L (ref 20.0–24.0)
Drawn by: 206361
O2 Saturation: 96.8 %
pCO2 arterial: 39.7 mmHg (ref 35.0–45.0)
pO2, Arterial: 84 mmHg (ref 80.0–100.0)

## 2011-03-25 LAB — URINALYSIS, ROUTINE W REFLEX MICROSCOPIC
Bilirubin Urine: NEGATIVE
Ketones, ur: NEGATIVE mg/dL
Leukocytes, UA: NEGATIVE
Nitrite: NEGATIVE
Protein, ur: 30 mg/dL — AB

## 2011-03-25 LAB — URINE MICROSCOPIC-ADD ON

## 2011-03-25 LAB — CBC
Platelets: 110 10*3/uL — ABNORMAL LOW (ref 150–400)
RBC: 3.96 MIL/uL — ABNORMAL LOW (ref 4.22–5.81)
RDW: 13.8 % (ref 11.5–15.5)
WBC: 6.7 10*3/uL (ref 4.0–10.5)

## 2011-03-25 LAB — COMPREHENSIVE METABOLIC PANEL
ALT: 10 U/L (ref 0–53)
AST: 17 U/L (ref 0–37)
CO2: 22 mEq/L (ref 19–32)
Calcium: 9.7 mg/dL (ref 8.4–10.5)
Chloride: 106 mEq/L (ref 96–112)
Creatinine, Ser: 1.61 mg/dL — ABNORMAL HIGH (ref 0.50–1.35)
GFR calc Af Amer: 51 mL/min — ABNORMAL LOW (ref >60)
GFR calc non Af Amer: 42 mL/min — ABNORMAL LOW (ref >60)
Glucose, Bld: 92 mg/dL (ref 70–99)
Sodium: 138 mEq/L (ref 135–145)
Total Bilirubin: 0.4 mg/dL (ref 0.3–1.2)

## 2011-03-25 LAB — ABO/RH: ABO/RH(D): A POS

## 2011-03-25 LAB — PROTIME-INR: Prothrombin Time: 14.5 seconds (ref 11.6–15.2)

## 2011-03-25 LAB — SURGICAL PCR SCREEN
MRSA, PCR: NEGATIVE
Staphylococcus aureus: NEGATIVE

## 2011-03-25 LAB — APTT: aPTT: 34 seconds (ref 24–37)

## 2011-03-29 ENCOUNTER — Other Ambulatory Visit: Payer: Self-pay | Admitting: Oncology

## 2011-03-29 ENCOUNTER — Encounter (HOSPITAL_BASED_OUTPATIENT_CLINIC_OR_DEPARTMENT_OTHER): Payer: BC Managed Care – PPO | Admitting: Oncology

## 2011-03-29 DIAGNOSIS — D696 Thrombocytopenia, unspecified: Secondary | ICD-10-CM

## 2011-03-29 LAB — CBC WITH DIFFERENTIAL/PLATELET
Eosinophils Absolute: 0.1 10*3/uL (ref 0.0–0.5)
HGB: 11.5 g/dL — ABNORMAL LOW (ref 13.0–17.1)
MCV: 82.9 fL (ref 79.3–98.0)
MONO%: 10.2 % (ref 0.0–14.0)
NEUT#: 3.8 10*3/uL (ref 1.5–6.5)
RBC: 3.97 10*6/uL — ABNORMAL LOW (ref 4.20–5.82)
RDW: 14.2 % (ref 11.0–14.6)
WBC: 6.3 10*3/uL (ref 4.0–10.3)
lymph#: 1.7 10*3/uL (ref 0.9–3.3)
nRBC: 0 % (ref 0–0)

## 2011-03-29 LAB — MORPHOLOGY: PLT EST: DECREASED

## 2011-03-29 NOTE — Discharge Summary (Signed)
[name], [name] NO.:  [id number]  MEDICAL RECORD NO.:  [id number]  LOCATION:  3039                         FACILITY:  [facility]  PHYSICIAN:  Pleas Koch, MD        DATE OF BIRTH:  Dec 09, 1934  DATE OF ADMISSION:  03/08/2011 DATE OF DISCHARGE:                              DISCHARGE SUMMARY   Date of discharge likely March 18, 2011.  DISCHARGE DIAGNOSES: 1. Spontaneous right forearm bleed - etiology unknown. 2. Anemia is multifactorial - currently on iron, thiamine, B12, and     folic acid supplementation 3. Hypertension, new diagnosis and currently well controlled on     metoprolol 12.5 b.i.d. 4. Coincidental infrarenal aortic aneurysm size 7.5 cm - tentatively     scheduled with Dr. [name] for surgery March 30, 2011.  Risks and     benefits have been explained by Dr. [name]. 5. Pancytopenia, steadily improving, last platelet count 117. 6. Acute kidney injury with probable chronic kidney disease stage I to    II, stable. 7. Prior history of tobacco, ethanol use.  The patient states he has     stopped. 8. Peripheral vascular disease with ABIs on the right 0.47 and ABIs on     the left 0.83.  This discharge summary encompasses the patient's entire stay.  PERTINENT PROCEDURES DONE: 1. ABIs as noted above. 2. CT forearm on March 08, 2011 showed:     a.     Increased density with proximal forearm musculature      laterally suggestive of acute hemorrhage or ill-defined mass,      could be atypical appearance of focal myositis given the increased      density     b.     Diffuse forearm subcutaneous edema which is nonspecific may      probably indicate cellulitis.     c.     No acute osseous findings.  Wrist greater than elbow      arthropathic changes suggestive of CPPD arthropathy. 3. MRI done March 09, 2011 showed:     a.     Heterogenous mass-like signal in flexor carpi radialis      muscle proximally favoring hematoma or mass.  Also prominent  edema      in flexor carpi radialis muscle     b.     Edema anterior forearm subcutaneous tissue.     c.     Edema and enhancement through hamate bone which cannot      exclude AVN involving hamate.     d.     Scapholunate advanced collapse (SLAC wrist). 4. CT abdomen, pelvis done March 10, 2011 showed:     a.     No gross evidence of cirrhosis, multiple calcified      gallstones, contracted gallbladder.  LARGE infrarenal abdominal      aortic aneurysm, acute bleeding, maximal diameter as large as 7.5      cm.  Occlusion of the right iliac artery.     b.     Right inguinal hernia containing small intestine, no      obstruction. 5. Chest x-ray 2-view March 11, 2011 showed  pulmonary     hyperinflation, probably chronic interstitial lung changes and     acute cardiopulmonary abnormalities. 6. Abdominal one-view showed no retained oral contrast. 7. Myocardial perfusion test done March 14, 2011 showed left     ventricular EF of 49%, negative for pharmacologic and stress-     induced ischemia. 8. Aortogram done March 15, 2011 showed:     a.     Left common iliac artery aneurysm     b.     Left internal iliac artery patent.     c.     Patent aorta with sluggish flow consistent with aortic      abdominal aneurysm.     d.     Patent celiac axis and superior mesenteric artery.     e.     Celiac collateral which traversed his abdomen constitutes      the distal right external iliac artery.     f.     No inferior mesenteric artery visualized.  PERTINENT CONSULTS ON THE CASE:  Dr. Darrold Span of Oncology, Dr. Jens Som of Cardiology, Dr. [name] of Vascular Surgery, Montez Morita of Orthopedics.  Briefly, this is a pleasant 76 year old male with sparse medical contact, remote history of alcoholism, who came to the emergency room with complaints right arm pain and swelling.  He was found to have spontaneous hematoma of the right arm with the question of  cellulitis and was seen by Ortho  Hand who felt the spontaneous hematoma would resolve on its own.  The patient was found to have thrombocytopenia which raised the concern of bleeding and Hematology evaluated the patient during the hospitalization.  Because of concerns of possible liver cirrhosis and portal hypertension, CT scan of the abdomen was performed and this showed large infrarenal aortic aneurysm size 7.5 cm   Vascular Surgery, Dr. [name] was consulted and he recommended Cardiology risk stratification which was done and the Myoview tests are as above which show he should be cleared for surgery.  Tentatively, his surgery has been scheduled for March 30, 2011. Platelet count has improved steadily and the patient was taken off of aspirin and Lovenox.  His platelet reached a low of 84 on September 12 and today's results are still pending.  However, yesterday's are 117.  Dr. Darrold Span of Hematology recommended various studies inclusive of anemia panel and various other studies inclusive of von Willebrand factor which were negative, immunofixation which were negative, IgG which was within normal range.  Bence Jones proteins that were done as well as SPEP that were done were not really suggestive of multiple myeloma, however, the elevated free light chains raises the possibility of light chain disease and this should be followed up closely in the outpatient setting with Dr. Darrold Span.  The patient has had occult blood testing which is negative.  The patient had an LDH also which was not suggestive of hemolysis.  Technology reviewed smear, just showed large platelets which appeared to be decreased and the pathology smear review showed platelets decreasing, no clumping, satellite, and no schistocystes.  Per Dr. Precious Reel last note, it was noted that the patient has no plans chemo, RT, and has no cancer diagnosis currently and there is no evidence for myeloma, no evidence for acquired von Willebrand.  The patient  definitively will need a colonoscopy at another point in time, but per Dr. Precious Reel last note, she is going to see the patient 1 day prior to surgery, March 29, 2011, and will repeat lab work at  that time.  The patient was seen on day of discharge, was doing well.  As noted above, his labs are still pending at present time.  However, reticulocyte count done from March 17, 2011 indicates he is producing his myeloid activity is good and he is producing blood well with retic count of 1.8.  Vital Signs on day of discharge, temperature 98.3, pulse 76, respirations 17, blood pressures 123/74, O2 sat 97-100%.  The patient is alert, oriented, has no pain.  He has already been up out of bed.  He has been walking around.  He had no chest pain, no shortness of breath, no abdominal pain, no bleeding from his stool, no nausea/vomiting.  Pending the CBC test, the patient will be discharged.  We will alert Dr. [name] to the possibility of the same.  It was a pleasure taking care of this patient.          ______________________________ Pleas Koch, MD     JS/MEDQ  D:  03/18/2011  T:  03/18/2011  Job:  [id number]  Electronically Signed by Pleas Koch MD on 03/29/2011 03:36:45 PM

## 2011-03-30 ENCOUNTER — Inpatient Hospital Stay (HOSPITAL_COMMUNITY): Payer: BC Managed Care – PPO

## 2011-03-30 ENCOUNTER — Inpatient Hospital Stay (HOSPITAL_COMMUNITY)
Admission: RE | Admit: 2011-03-30 | Discharge: 2011-04-15 | DRG: 549 | Disposition: A | Discharge status: Home Health | Payer: BC Managed Care – PPO | Source: Ambulatory Visit | Attending: Vascular Surgery | Admitting: Vascular Surgery

## 2011-03-30 ENCOUNTER — Other Ambulatory Visit: Payer: Self-pay | Admitting: Vascular Surgery

## 2011-03-30 DIAGNOSIS — I723 Aneurysm of iliac artery: Secondary | ICD-10-CM

## 2011-03-30 DIAGNOSIS — R64 Cachexia: Secondary | ICD-10-CM | POA: Diagnosis present

## 2011-03-30 DIAGNOSIS — I739 Peripheral vascular disease, unspecified: Secondary | ICD-10-CM | POA: Diagnosis present

## 2011-03-30 DIAGNOSIS — I4891 Unspecified atrial fibrillation: Secondary | ICD-10-CM | POA: Diagnosis present

## 2011-03-30 DIAGNOSIS — F17200 Nicotine dependence, unspecified, uncomplicated: Secondary | ICD-10-CM | POA: Diagnosis present

## 2011-03-30 DIAGNOSIS — I714 Abdominal aortic aneurysm, without rupture, unspecified: Principal | ICD-10-CM | POA: Diagnosis present

## 2011-03-30 DIAGNOSIS — I10 Essential (primary) hypertension: Secondary | ICD-10-CM | POA: Diagnosis present

## 2011-03-30 DIAGNOSIS — D509 Iron deficiency anemia, unspecified: Secondary | ICD-10-CM | POA: Diagnosis present

## 2011-03-30 DIAGNOSIS — Z681 Body mass index (BMI) 19 or less, adult: Secondary | ICD-10-CM

## 2011-03-30 DIAGNOSIS — E876 Hypokalemia: Secondary | ICD-10-CM | POA: Diagnosis present

## 2011-03-30 DIAGNOSIS — J9819 Other pulmonary collapse: Secondary | ICD-10-CM | POA: Diagnosis present

## 2011-03-30 DIAGNOSIS — J449 Chronic obstructive pulmonary disease, unspecified: Secondary | ICD-10-CM | POA: Diagnosis present

## 2011-03-30 DIAGNOSIS — S5010XA Contusion of unspecified forearm, initial encounter: Secondary | ICD-10-CM | POA: Diagnosis present

## 2011-03-30 DIAGNOSIS — I70219 Atherosclerosis of native arteries of extremities with intermittent claudication, unspecified extremity: Secondary | ICD-10-CM

## 2011-03-30 DIAGNOSIS — J189 Pneumonia, unspecified organism: Secondary | ICD-10-CM | POA: Diagnosis present

## 2011-03-30 DIAGNOSIS — N179 Acute kidney failure, unspecified: Secondary | ICD-10-CM | POA: Diagnosis present

## 2011-03-30 DIAGNOSIS — D696 Thrombocytopenia, unspecified: Secondary | ICD-10-CM | POA: Diagnosis present

## 2011-03-30 DIAGNOSIS — B370 Candidal stomatitis: Secondary | ICD-10-CM | POA: Diagnosis present

## 2011-03-30 DIAGNOSIS — E46 Unspecified protein-calorie malnutrition: Secondary | ICD-10-CM | POA: Diagnosis present

## 2011-03-30 DIAGNOSIS — J4489 Other specified chronic obstructive pulmonary disease: Secondary | ICD-10-CM | POA: Diagnosis present

## 2011-03-30 LAB — APTT: aPTT: 35 seconds (ref 24–37)

## 2011-03-30 LAB — CBC
HCT: 24.3 % — ABNORMAL LOW (ref 39.0–52.0)
Hemoglobin: 8.5 g/dL — ABNORMAL LOW (ref 13.0–17.0)
MCH: 28.9 pg (ref 26.0–34.0)
MCHC: 35 g/dL (ref 30.0–36.0)
RDW: 14 % (ref 11.5–15.5)

## 2011-03-30 LAB — BASIC METABOLIC PANEL
BUN: 22 mg/dL (ref 6–23)
CO2: 21 mEq/L (ref 19–32)
Chloride: 108 mEq/L (ref 96–112)
Creatinine, Ser: 1.27 mg/dL (ref 0.50–1.35)

## 2011-03-30 LAB — PROTIME-INR: Prothrombin Time: 16.6 seconds — ABNORMAL HIGH (ref 11.6–15.2)

## 2011-03-30 NOTE — Consult Note (Signed)
NAME:  [name], [name] NO.:  [id number]  MEDICAL RECORD NO.:  [id number]  LOCATION:  3039                         FACILITY:  [facility]  PHYSICIAN:  Reece Packer, M.D. DATE OF BIRTH:  06/24/1935  DATE OF CONSULTATION:  03/10/2011 DATE OF DISCHARGE:                                CONSULTATION   The patient is a 76 year old gentleman without significant known medical problems, though he has not seen a physician in over 10 years, seen in consultation at the request of the Hospitalist Service due to anemia and thrombocytopenia in the setting of possible spontaneous bleed/cellulitis of the right upper extremity.  Workup including CT abdomen and pelvis and additional labs is still in process.  The patient had presented with a 3-4-day history of progressive soreness and swelling in the right upper extremity, which began without known trauma.  He had awakened on the morning of March 08, 2011, with swelling and discomfort in the mid forearm which subsequently extended up above his elbow.  The patient feels that the swelling and discomfort has improved somewhat with interventions in the hospital, and to him is clearly not any worse.  He has no prior history of unusual bleeding, was not on anticoagulants, had used only occasional aspirin and otherwise had been feeling entirely well prior to admission including going bowling with his left arm only on March 08, 2011, in the evening after the problem had already begun.  He works as a Arboriculturist at [employer], but denies any heavy exertion or trauma with his work.  He had had no fever prior to admission, good energy and good appetite, no noted change in his weight, no epistaxis or gum bleeding, no change in his bowels, no bruising, no hematuria.  REVIEW OF SYSTEMS:  Otherwise, no headaches, no dental problems, no sinus symptoms, no shortness of breath or other respiratory symptoms. No cardiac symptoms.  No  epigastric symptoms.  He did have some arthritis symptoms, right wrist and shoulders.  No known drug allergies.  He had "several" liver biopsies between 1961, and 1964, when he was in the [employer], but does not recall if anyone found a diagnosis or felt that he had hepatitis.  He has had no known low blood counts, though not clear when or if he has had CBC checked in greater than 10 years, with evaluations prior to 10 years ago having been done by a physician at his place of employment.  FAMILY HISTORY:  Sister and brother with gout.  SOCIAL HISTORY:  Married, originally from [place], then lived in [place], then was in service in the 1960s, and now lives in [place].  Tobacco for 40 years, presently a half pack daily, though he did stop smoking for 5 years previously.  Works as a custody as above.  Alcohol 30-40 years ago, none now.  PHYSICAL EXAMINATION:  GENERAL:  Very pleasant African American gentleman, looks his stated age, comfortable in bed with his arm propped on pillows. VITAL SIGNS:  Temperature 98.7, heart rate 57 and regular, respirations 20, blood pressure 140/74, room air saturation 97%. HEENT:  Pupils are equal and reactive.  No adenopathy, cervical, supraclavicular, bilateral  axillary.  No JVD or thyroid masses. LUNGS:  No wheezes or rales. HEART:  Regular rate and rhythm. EXTREMITIES:  Left upper extremity is not remarkable.  The right upper extremity has swelling in the midforearm which is firm, not too tender, slightly warm.  Radial pulse is strong and he has extensive hematoma extending from the forearm up the inner arm to near the axilla.  Lower extremities, no edema or cords.  He moves easily except he cannot straighten his right upper extremity fully at the elbow. ABDOMEN:  Soft and nontender.  No appreciable hepatosplenomegaly.  Some bowel sounds present, nontender.  Labs from March 08, 2011; white count 6.8, ANC 3.9, hemoglobin 11, MCV  80.4, platelets 100,000.  Differential not remarkable.  Sed rate 29. From March 10, 2011, white count 7.5, hemoglobin down to 9.8, MCV of 84, INR of 1.1, PTT 40.  Fecal occult blood negative x1.  LDH 188. Sodium 142, potassium 4, chloride 107, CO2 of 27, glucose 94, BUN 15, creatinine 1.5.  GFR 54.  Total bili 0.5, alk phos 68, GOT 15, GPT 5, total protein 6.6, albumin 2.9 and calcium 9.  Smear reviewed pending, platelet function assay pending, anemia panel pending.  CT of right forearm showed advanced arthropathic changes, radiocarpal joint and mild degenerative changes at the elbow, heterogeneously increased density muscles of the proximal forearm.  No evidence of tendon rupture.  No evidence of active hemorrhage.  Diffuse forearms subcutaneous edema. MRI of the right forearm, 9.4 x 3.3 x 3.5-cm heterogeneous mass involving flexor carpi radialis muscles favoring intramuscular hemorrhage, diffuse subcutaneous edema, edema and enhancement of the __________ and scapholunate advanced collapse.  IMPRESSION AND RECOMMENDATION: 1. Hemorrhage into the right upper extremity, question spontaneous     bleed, symptoms seem to be improving per the patient. 2. Anemia present on admission and hemoglobin down further since     admission with workup in process.  No prior CBCs for comparison,     not clearly symptomatic now.  We will follow up labs pending. 3. Thrombocytopenia which was mild on admission, has decreased since     then.  No priors again for comparison.  A CT is pending to look     particularly for liver disease and splenomegaly.  I would transfuse     platelets if he appears to be having active bleeding, otherwise     need to follow up the evaluation in process and SPEP and UPEP with     a low albumin, suggest checking DIC panel which I have not ordered     at his chart has now left the floor to go for the CT scan. 4. Long and ongoing tobacco.  No pulmonary symptoms, has not had a      chest x-ray. 5. Remote alcohol.  I will follow up.  Thank you for the consult.     Reece Packer, M.D.     LPL/MEDQ  D:  03/10/2011  T:  03/10/2011  Job:  [id number]  Electronically Signed by Jama Flavors M.D. on 03/30/2011 08:51:30 AM

## 2011-03-31 ENCOUNTER — Inpatient Hospital Stay (HOSPITAL_COMMUNITY): Payer: BC Managed Care – PPO

## 2011-03-31 LAB — POCT I-STAT 7, (LYTES, BLD GAS, ICA,H+H)
Acid-base deficit: 3 mmol/L — ABNORMAL HIGH (ref 0.0–2.0)
Acid-base deficit: 2 mmol/L (ref 0.0–2.0)
Bicarbonate: 23.6 mEq/L (ref 20.0–24.0)
Bicarbonate: 24.2 mEq/L — ABNORMAL HIGH (ref 20.0–24.0)
Calcium, Ion: 1.27 mmol/L (ref 1.12–1.32)
HCT: 30 % — ABNORMAL LOW (ref 39.0–52.0)
Hemoglobin: 10.2 g/dL — ABNORMAL LOW (ref 13.0–17.0)
Potassium: 4.3 mEq/L (ref 3.5–5.1)
Sodium: 139 mEq/L (ref 135–145)
Sodium: 138 mEq/L (ref 135–145)
TCO2: 26 mmol/L (ref 0–100)
pH, Arterial: 7.347 — ABNORMAL LOW (ref 7.350–7.450)
pH, Arterial: 7.343 — ABNORMAL LOW (ref 7.350–7.450)
pO2, Arterial: 293 mmHg — ABNORMAL HIGH (ref 80.0–100.0)

## 2011-03-31 LAB — PREPARE PLATELET PHERESIS

## 2011-03-31 LAB — GLUCOSE, CAPILLARY
Glucose-Capillary: 121 mg/dL — ABNORMAL HIGH (ref 70–99)
Glucose-Capillary: 135 mg/dL — ABNORMAL HIGH (ref 70–99)
Glucose-Capillary: 151 mg/dL — ABNORMAL HIGH (ref 70–99)

## 2011-03-31 LAB — CBC
MCH: 29.1 pg (ref 26.0–34.0)
MCHC: 35.1 g/dL (ref 30.0–36.0)
RBC: 3.51 MIL/uL — ABNORMAL LOW (ref 4.22–5.81)
RDW: 14.4 % (ref 11.5–15.5)
WBC: 12.4 10*3/uL — ABNORMAL HIGH (ref 4.0–10.5)

## 2011-03-31 LAB — COMPREHENSIVE METABOLIC PANEL
Albumin: 2.9 g/dL — ABNORMAL LOW (ref 3.5–5.2)
Alkaline Phosphatase: 53 U/L (ref 39–117)
BUN: 26 mg/dL — ABNORMAL HIGH (ref 6–23)
Creatinine, Ser: 1.3 mg/dL (ref 0.50–1.35)
GFR calc Af Amer: 61 mL/min — ABNORMAL LOW (ref >90)
Glucose, Bld: 134 mg/dL — ABNORMAL HIGH (ref 70–99)
Total Protein: 6.2 g/dL (ref 6.0–8.3)

## 2011-03-31 LAB — MAGNESIUM: Magnesium: 1.5 mg/dL (ref 1.5–2.5)

## 2011-03-31 NOTE — Op Note (Signed)
NAMEMAGUIRE, SIME NO.:  [id number]  MEDICAL RECORD NO.:  [id number]  LOCATION:  2304                         FACILITY:  [facility]  PHYSICIAN:  Fransisco Hertz, MD       DATE OF BIRTH:  1934/11/01  DATE OF PROCEDURE:  03/30/2011 DATE OF DISCHARGE:                              OPERATIVE REPORT   PROCEDURES: 1. Aortobifemoral bypass graft placement. 2. Ligation of left common iliac artery aneurysm.  PREOPERATIVE DIAGNOSES: 1. Abdominal aortic aneurysm. 2. Left common iliac artery aneurysm.  POSTOPERATIVE DIAGNOSES: 1. Abdominal aortic aneurysm. 2. Left common iliac artery aneurysm.  SURGEON:  Fransisco Hertz, MD.  ASSISTANTS:  Di Kindle. Edilia Bo, MD and Pecola Leisure, [place]  FINDINGS:  Included dopplerable pedal pulse signals at the end of the case with the right posterior tibial as the strongest signal and the left anterior tibial as the strongest on that side  SPECIMENS:  left femoral and paraaortic lymph nodes which were sent to Pathology.  ESTIMATED BLOOD LOSS:  800 mL.  URINE OUTPUT:  950 today.    FLUIDS: 5000 mL of crystalloid and 1 unit of Cell Saver.  INDICATIONS:  This is a 76 year old patient with a large aortic aneurysm.  Based on his previous evaluation including CT scan and also aortogram, I did not think he was a candidate for endovascular aortic repair.  Given the risk of possible rupture, this patient wanted to proceed forward with repair of his aortic aneurysm.  Also, he had a left common iliac artery aneurysm that required intervention.  Subsequently, I offered this patient an aortobifemoral bypass with ligation of the left common iliac artery aneurysm.  He is aware of the risks of this procedure include death, bleeding, infection, stroke, myocardial infarction, possible aortoenteric fistula, possible ventral hernia development at the laparotomy site, possible embolization to his leg, possible development of pseudoaneurysm in the  future, and possible need for additional procedures.  Also, we discussed the risks of renal failure, paraplegia, and sexual dysfunction.  He is aware of these risks and agreed to proceed forward.  DESCRIPTION OF THE OPERATION:  After full informed written consent was obtained from this patient, he was brought back to the operating room, and placed supine upon the operating table.  Note, prior to coming back to  the operating room, he already had a Cordis placed along with placement of a Swan-Ganz catheter.  He also had placement of an A-line.  Prior to induction, he received IV antibiotics.  After obtaining adequate anesthesia, he had a Foley placed and an OG tube was placed. He was then prepped and draped in the standard fashion for an abdominal aortic aneurysm repair.  We turned our attention first to both groins. Dr. Edilia Bo dissected out the left common femoral artery from the level of the external iliac artery all the way down to the femoral bifurcation.  The femoral side branches were controlled with vessel loops and the profunda and SFA were controlled with vessel loops.  In a similar fashion, on the right side, I dissected out the common femoral artery and  SFA and profunda from the external iliac artery all the way down  to the femoral bifurcation.  Once again, all branches and side branches were controlled with vessel loops.  At this point, we put wet Ray-Tec in each groin to keep the vessels moist well.  Turning our attention to the patient's midline, a midline incision was made and then dissecting through the subcutaneous tissue with electrocautery, I developed a plane down to the peritoneum.  Then, I opened up the fascia in the midline.  Under direct visualization, I opened into the peritoneal cavity, making certain no bowel was in proximity.  I then opened up the rest of this patient's fascia under direct visualization with electrocautery from his xiphoid down to his pubic  area.  At this point, I placed a Balfour retractor to exposure the abdominal cavity. Then, I ran the bowels from the level of the ligament of Treitz all the  way down to the sigmoid colon.  There were no obvious masses.  There appeared to be some adhesions in the mid abdomen and consistent with some degree of previous adhesions.  At this point, we placed the Omni- Tract retractor and then elevated the transverse colon out of the abdomen and then I packed the small bowels out of the abdomen laterally and then turned my attention to the duodenum, this was dissected out until I had easy visualization of the aorta and its neck.  Note that due to the large size of this aneurysm, the intraabdominal anatomy was quite distorted and in fact the aneurysm was actually tilted in a oblique position and the aortic bifurcation was splayed out in almost 180 degrees with each of the iliacs making nearly 90-degree turns.  We started toward the neck and dissected with electrocautery through the retroperitoneum.  I was able to dissect down to the left renal vein which was easily identified.  We were then able to easily identify the right renal artery which was on the scan at roughly the same level of the renal vein.  We then carried this dissection all the way down to the bifurcation, dissecting the retroperitoneum off this large aortic aneurysm.  I then carried the dissection down slightly to the right of the midline and carried this dissection down to where I could easily see the common iliac artery on the left side.  Note, this artery was made a 90- degree turn with the main aorta and was quite aneurysmal, I dissected this out down to the level of where I felt the bifurcation was and then was able to safely pass a right angle around this artery at this level. I then placed 2 0-Tevdek ties around this artery and then stayed these 2 ties with the plan to ligate this.  At this point, then I went back to the  neck, dissect this abdominal aorta down to the spine on the left and right side of this aneurysm and was able to eventually get around the aneurysm in the infrarenal position and was able to place an umbilical tape around the aorta.  Note that based on the location, I felt that this infrarenal portion of the aorta was about 25 mm to 28 mm in diameter in the infrarenal neck and then in fact the immediate infrarenal neck was not substantially smaller than about 2-3 cm distally. Subsequently, I felt that we had extra long neck to which could sew to. At this point, I turned my attention to the groin.  We ligated the crossing vein on both sides above the external iliac artery and developed bluntly  a tunnel from above.  Also, we developed these tunnels on top of the common iliac artery, taking care to avoid dissecting on top of the ureter on either side.  I also dissected from the groin incision on both sides and eventually was to complete the tunneling on both sides.   Taking care, I passed a clamp underneath the level of the ureter on both sides and umbilical tapes on both sides to help direct the eventual placement of  the limbs of this aortobifemoral graft.  At this point, we gave the patient 5000  units of heparin which is a full anticoagulation bolus for this patient.  I also gave 25 g of Mannitol at this time.  After waiting 5 minutes,  then I turned my attention to the left common iliac artery distally.   I tied down the 2 Tevdek ties to ligate the common iliac artery aneurysm on  this side.  At this point, I turned my attention to the aortic neck.   Using a Debakey clamp, I clamped the infrarenal abdominal aorta stabilized  the clamp in place with an umbilical tape.  There was no further abdominal  pulsation within the distal aorta.  I then moved about 2 cm distal to the  aortic clamp and then teed off the aorta at this level in the midline with  electrocautery and then using a Mayo  scissor, I transected all the way down  to the aortic bifurcation and then there was 1 actively bleeding lumbar artery  which I oversewed with a figure-of-eight stitch of 3-0 silk.  There was no more  active bleeding at this point.  I then evacuated the rest of this aorta of  the laminar thrombus.  There was no obvious lumbar bleeding anywhere at this point. At this point, I turned my attention back to the aortic neck.  I then transversely incised the aorta to finish teeing off this aorta and then using the abdominal sizers, I decided on a 20-mm graft with 10-mm limbs, this graft was brought to the field.  I transected it to shorten it and then the aortobifemoral graft was sewn to the aorta with a running stitch of 3-0 Prolene.  Prior to completing this anastomosis, I pulled up on the entirety of the suture line with a nerve hook.  We then  completed the anastomosis in the usual fashion.  I clamped off the two limbs  and then I released the clamp.  There was no significant drop in systemic pressure.   The anastomosis appeared to be intact.  I had then at this point released the clamp on the 2 limbs and allowed the graft to blow out any clot within the aortic graft, and then I replaced the Debakey aortic clamp on the aorta and then flushed out this graft by injecting heparinized saline through the right limb, so I flushed out the entire graft.  There was no more blood or clot in this graft at this point.  At this point, then we tied each limb to the umbilical tape on each side and delivered each through its respective tunnel,  I took care to maintain the orientation of this graft.  Note the right common iliac artery did not need to be addressed as it was already thrombosed.  At this point, we pulled the graft to appropriate tension bilaterally.  Due to the lay of the graft with this aneurysm slightly oblique, I allowed the right limb to be slightly longer.  On both sides, we proceeded with  construction of the distal anastomosis.  Dr. Edilia Bo on the left side clamped the external iliac artery and then placed the profunda artery and SFA under tension with vessel loops.  He then made arteriotomy with an 11 blade, extended it with Potts scissor.  He then rechecked the tension on the left aortobifemoral limb and then spatulated this graft, cutting it to appropriate length of the process and then this limb was sewn to the artery with a running stitch of 5-0 Prolene.  Prior to completing this anastomosis, the profunda artery was allowed to back bleed.  There was  excellent backbleeding.  The SFA was allowed to back bleed.  There was no bleeding.  Briefly, a four Fogarty was passed down the SFA to verify that there was in fact no thrombus there and there was no thrombus that was retrieved.  This SFA likely is chronically occluded based on exam. At this point, both the femoral arteries were placed under tension again.  We placed 2 Fogarty graft clamps on the two limbs of the aortobifemoral limb and then the clamp on the aorta was removed.  This pressurized the aortobifemoral graft.  We then removed briefly the clamp on the left limb and allowed it to flush out in the groin.  There was no clot noted.  We then reclamped the graft, irrigated it out and the anastomosis was completed in the usual fashion by Dr. Edilia Bo.  In exactly the same fashion on the right side, this process was repeated by myself on the right side. In a similar fashion, there was no SFA backbleeding but excellent profunda bleeding.  We allowed the graft to bleed in an antegrade fashion in similar fashion on this side.  The suture line on both sides were examined.  There was a little bit of bleeding and then some loose suture was pulled with a nerve hook and then retied with additional 5-0 Prolene.  At this point, we placed thrombin and Gelfoam on both groins and then turned our attention back to the abdomen.  There  appeared to be no more active bleeding from the proximal aortic anastomosis.  There was a small side branch that was found that was bleeding, was controlled with a titanium clip.  We then irrigated the rest of this aorta.  There was no residual bleeding lumbar arteries.  It was felt that this point that  the patient was ready for closure.  We gave the patient 50 mg of protamine to reverse anticoagulation.  After waiting a few minutes, we irrigated out the abdominal cavity with clean normal saline and no more active bleeding was noted.  I then briefly dissected out the portion of the distal aneurysm sac to use it to help cover portions of the graft.  I then reapproximated the abdominal aorta sac around the graft, running from the proximal extent all the way down to the bifurcation.  I had to teed off part of the abdominal aortic sac to cover up the left portion of limb and a portion of the limb could not be covered by the aortic sac, but it was covered by the mesentery of the sigmoid colon.  The majority of the right aortobifemoral limb was successfully covered, however, with the aortic sac.  At this point, then I reapproximated the retroperitoneum with a running stitch of 3-0 Vicryl, taking care to reposition all the bowels back to their proper anatomic position.  This was completed for most  of this length of this patient's retroperitoneum. There is a small area toward the bladder that could not be closed due to the fact that previously this area had in fact been closed by adhesion which I did not feel, it made sense to reapproximate the adhesions.   At this point, I irrigated out the abdominal cavity again to remove as much of the blood as possible.  There was no more active bleeding noted.  I then repositioned the bowels back in anatomic possition, removed all the retractors, and checked carefully the abdomen for any residual lap pad, there was none noted.  No foreign bodies were  noted. I then removed the remaining the retractor system.  At this point, we then reapproximated the midline fascia with a running stitch of double stranded 1 PDS which I ran from the superior portion of the incision down  to past theumbilicus and then ran a second double stranded PDS from inferior  portion until I met the other suture line.  I then I tied the 2 sets of double-stranded PDS together.  We then transected the suture and then I tacked down this suture with 3-0 Vicryl to the fascia.  The skin was then cleaned, dried. Some bleeding points in the skin were control with electrocautery.  The skin edges were reapproximated with staples along the entirety of this incision.  We then turned our attention to each groin.  Both groins were irrigated out with normal saline.  The tissue immediately above the aortobifemoral graft on both sides was covered with a layer of subcutaneous tissue reapproximated with 2-0 Vicryl.   The subcutaneous tissue was then reapproximated with a double layer of  3-0 Vicryl.  The skin was then cleaned, dried, and reapproximated with  a running subcuticular 4-0 Monocryl stitch. The skin was then cleaned,  dried in both groins and Dermabond was applied.  The midline incision  was also covered with a Cover-all sterile dressing.  The patient tolerated  this procedure well.  Please note that at the end the case, I broke scrub and checked the perfusion in both feet.  There were dopplerable signals in both feet, the strongest on  the right side is posterior tibial and the left is the anterior tibial.  COMPLICATIONS:  No known obvious.  CONDITION: stable     Fransisco Hertz, MD     BLC/MEDQ  D:  03/30/2011  T:  03/31/2011  Job:  [id number]  Electronically Signed by [name] MD on 03/31/2011 12:19:03 PM

## 2011-04-01 ENCOUNTER — Inpatient Hospital Stay (HOSPITAL_COMMUNITY): Payer: BC Managed Care – PPO

## 2011-04-01 DIAGNOSIS — D696 Thrombocytopenia, unspecified: Secondary | ICD-10-CM

## 2011-04-01 LAB — GLUCOSE, CAPILLARY
Glucose-Capillary: 113 mg/dL — ABNORMAL HIGH (ref 70–99)
Glucose-Capillary: 111 mg/dL — ABNORMAL HIGH (ref 70–99)

## 2011-04-01 LAB — CBC
Platelets: 62 10*3/uL — ABNORMAL LOW (ref 150–400)
RDW: 14.8 % (ref 11.5–15.5)
WBC: 14.7 10*3/uL — ABNORMAL HIGH (ref 4.0–10.5)

## 2011-04-01 LAB — BASIC METABOLIC PANEL
Chloride: 110 mEq/L (ref 96–112)
GFR calc Af Amer: 56 mL/min — ABNORMAL LOW (ref >90)
GFR calc non Af Amer: 49 mL/min — ABNORMAL LOW (ref >90)
Potassium: 4.2 mEq/L (ref 3.5–5.1)

## 2011-04-01 LAB — HEPATIC FUNCTION PANEL
ALT: 7 U/L (ref 0–53)
Alkaline Phosphatase: 52 U/L (ref 39–117)
Bilirubin, Direct: 0.2 mg/dL (ref 0.0–0.3)
Indirect Bilirubin: 0.4 mg/dL (ref 0.3–0.9)
Total Protein: 5.9 g/dL — ABNORMAL LOW (ref 6.0–8.3)

## 2011-04-01 LAB — PROTIME-INR: Prothrombin Time: 16.4 seconds — ABNORMAL HIGH (ref 11.6–15.2)

## 2011-04-01 LAB — MAGNESIUM: Magnesium: 2 mg/dL (ref 1.5–2.5)

## 2011-04-02 ENCOUNTER — Inpatient Hospital Stay (HOSPITAL_COMMUNITY): Payer: BC Managed Care – PPO

## 2011-04-02 DIAGNOSIS — J9819 Other pulmonary collapse: Secondary | ICD-10-CM

## 2011-04-02 LAB — GLUCOSE, CAPILLARY
Glucose-Capillary: 108 mg/dL — ABNORMAL HIGH (ref 70–99)
Glucose-Capillary: 126 mg/dL — ABNORMAL HIGH (ref 70–99)
Glucose-Capillary: 135 mg/dL — ABNORMAL HIGH (ref 70–99)

## 2011-04-02 LAB — CBC
HCT: 25.2 % — ABNORMAL LOW (ref 39.0–52.0)
Hemoglobin: 8.6 g/dL — ABNORMAL LOW (ref 13.0–17.0)
MCH: 28.9 pg (ref 26.0–34.0)
MCV: 84.6 fL (ref 78.0–100.0)
RBC: 2.98 MIL/uL — ABNORMAL LOW (ref 4.22–5.81)

## 2011-04-02 LAB — DIFFERENTIAL
Basophils Relative: 0 % (ref 0–1)
Eosinophils Relative: 0 % (ref 0–5)
Lymphs Abs: 0.8 10*3/uL (ref 0.7–4.0)
Monocytes Absolute: 1 10*3/uL (ref 0.1–1.0)
Monocytes Relative: 8 % (ref 3–12)
Neutro Abs: 10.7 10*3/uL — ABNORMAL HIGH (ref 1.7–7.7)

## 2011-04-02 LAB — BASIC METABOLIC PANEL
BUN: 28 mg/dL — ABNORMAL HIGH (ref 6–23)
CO2: 29 mEq/L (ref 19–32)
Calcium: 9 mg/dL (ref 8.4–10.5)
Glucose, Bld: 117 mg/dL — ABNORMAL HIGH (ref 70–99)
Sodium: 144 mEq/L (ref 135–145)

## 2011-04-02 LAB — PREPARE PLATELET PHERESIS

## 2011-04-03 ENCOUNTER — Inpatient Hospital Stay (HOSPITAL_COMMUNITY): Payer: BC Managed Care – PPO

## 2011-04-03 ENCOUNTER — Other Ambulatory Visit: Payer: Self-pay | Admitting: Oncology

## 2011-04-03 DIAGNOSIS — J9819 Other pulmonary collapse: Secondary | ICD-10-CM

## 2011-04-03 DIAGNOSIS — R0902 Hypoxemia: Secondary | ICD-10-CM

## 2011-04-03 LAB — BASIC METABOLIC PANEL
BUN: 27 mg/dL — ABNORMAL HIGH (ref 6–23)
CO2: 30 mEq/L (ref 19–32)
Chloride: 109 mEq/L (ref 96–112)
GFR calc Af Amer: 54 mL/min — ABNORMAL LOW (ref >90)
GFR calc non Af Amer: 44 mL/min — ABNORMAL LOW (ref >90)
Glucose, Bld: 131 mg/dL — ABNORMAL HIGH (ref 70–99)
Potassium: 4.9 mEq/L (ref 3.5–5.1)
Potassium: 3.2 mEq/L — ABNORMAL LOW (ref 3.5–5.1)
Sodium: 146 mEq/L — ABNORMAL HIGH (ref 135–145)
Sodium: 144 mEq/L (ref 135–145)

## 2011-04-03 LAB — DIFFERENTIAL
Eosinophils Absolute: 0 10*3/uL (ref 0.0–0.7)
Eosinophils Relative: 0 % (ref 0–5)
Lymphs Abs: 1 10*3/uL (ref 0.7–4.0)
Monocytes Relative: 7 % (ref 3–12)

## 2011-04-03 LAB — TYPE AND SCREEN
Unit division: 0
Unit division: 0

## 2011-04-03 LAB — POCT I-STAT 3, ART BLOOD GAS (G3+)
Bicarbonate: 19.3 mEq/L — ABNORMAL LOW (ref 20.0–24.0)
Patient temperature: 99.9
pH, Arterial: 7.502 — ABNORMAL HIGH (ref 7.350–7.450)

## 2011-04-03 LAB — CBC
MCH: 29.9 pg (ref 26.0–34.0)
MCV: 84.7 fL (ref 78.0–100.0)
Platelets: 74 10*3/uL — ABNORMAL LOW (ref 150–400)
RDW: 14.9 % (ref 11.5–15.5)

## 2011-04-03 LAB — GLUCOSE, CAPILLARY
Glucose-Capillary: 119 mg/dL — ABNORMAL HIGH (ref 70–99)
Glucose-Capillary: 119 mg/dL — ABNORMAL HIGH (ref 70–99)
Glucose-Capillary: 157 mg/dL — ABNORMAL HIGH (ref 70–99)

## 2011-04-03 LAB — POTASSIUM: Potassium: 3.4 mEq/L — ABNORMAL LOW (ref 3.5–5.1)

## 2011-04-04 ENCOUNTER — Inpatient Hospital Stay (HOSPITAL_COMMUNITY): Payer: BC Managed Care – PPO

## 2011-04-04 DIAGNOSIS — R0902 Hypoxemia: Secondary | ICD-10-CM

## 2011-04-04 DIAGNOSIS — J9819 Other pulmonary collapse: Secondary | ICD-10-CM

## 2011-04-04 LAB — CBC
HCT: 24.5 % — ABNORMAL LOW (ref 39.0–52.0)
Hemoglobin: 8.5 g/dL — ABNORMAL LOW (ref 13.0–17.0)
MCH: 29.5 pg (ref 26.0–34.0)
MCHC: 34.7 g/dL (ref 30.0–36.0)
MCV: 85.1 fL (ref 78.0–100.0)
RDW: 15 % (ref 11.5–15.5)

## 2011-04-04 LAB — BASIC METABOLIC PANEL
BUN: 25 mg/dL — ABNORMAL HIGH (ref 6–23)
Creatinine, Ser: 1.35 mg/dL (ref 0.50–1.35)
GFR calc Af Amer: 59 mL/min — ABNORMAL LOW (ref >90)
GFR calc non Af Amer: 50 mL/min — ABNORMAL LOW (ref >90)
Glucose, Bld: 138 mg/dL — ABNORMAL HIGH (ref 70–99)

## 2011-04-04 LAB — GLUCOSE, CAPILLARY

## 2011-04-05 ENCOUNTER — Inpatient Hospital Stay (HOSPITAL_COMMUNITY): Payer: BC Managed Care – PPO

## 2011-04-05 DIAGNOSIS — D696 Thrombocytopenia, unspecified: Secondary | ICD-10-CM

## 2011-04-05 DIAGNOSIS — D649 Anemia, unspecified: Secondary | ICD-10-CM

## 2011-04-05 LAB — GLUCOSE, CAPILLARY
Glucose-Capillary: 150 mg/dL — ABNORMAL HIGH (ref 70–99)
Glucose-Capillary: 112 mg/dL — ABNORMAL HIGH (ref 70–99)
Glucose-Capillary: 104 mg/dL — ABNORMAL HIGH (ref 70–99)

## 2011-04-05 LAB — BASIC METABOLIC PANEL
BUN: 31 mg/dL — ABNORMAL HIGH (ref 6–23)
Chloride: 107 mEq/L (ref 96–112)
GFR calc Af Amer: 53 mL/min — ABNORMAL LOW (ref >90)
GFR calc non Af Amer: 46 mL/min — ABNORMAL LOW (ref >90)
Glucose, Bld: 137 mg/dL — ABNORMAL HIGH (ref 70–99)
Potassium: 3.9 mEq/L (ref 3.5–5.1)
Sodium: 144 mEq/L (ref 135–145)

## 2011-04-05 LAB — CBC
HCT: 24.6 % — ABNORMAL LOW (ref 39.0–52.0)
Hemoglobin: 8.5 g/dL — ABNORMAL LOW (ref 13.0–17.0)
MCHC: 34.6 g/dL (ref 30.0–36.0)
RDW: 14.9 % (ref 11.5–15.5)
WBC: 7.5 10*3/uL (ref 4.0–10.5)

## 2011-04-06 ENCOUNTER — Inpatient Hospital Stay (HOSPITAL_COMMUNITY): Payer: BC Managed Care – PPO

## 2011-04-06 DIAGNOSIS — J9819 Other pulmonary collapse: Secondary | ICD-10-CM

## 2011-04-06 DIAGNOSIS — R0902 Hypoxemia: Secondary | ICD-10-CM

## 2011-04-06 LAB — BASIC METABOLIC PANEL
CO2: 29 mEq/L (ref 19–32)
Calcium: 9.1 mg/dL (ref 8.4–10.5)
Chloride: 107 mEq/L (ref 96–112)
GFR calc Af Amer: 61 mL/min — ABNORMAL LOW (ref >90)
Sodium: 145 mEq/L (ref 135–145)

## 2011-04-06 LAB — MAGNESIUM: Magnesium: 2.2 mg/dL (ref 1.5–2.5)

## 2011-04-06 LAB — CBC
HCT: 25.4 % — ABNORMAL LOW (ref 39.0–52.0)
HCT: 30.9 % — ABNORMAL LOW (ref 39.0–52.0)
Hemoglobin: 8.8 g/dL — ABNORMAL LOW (ref 13.0–17.0)
Hemoglobin: 10.5 g/dL — ABNORMAL LOW (ref 13.0–17.0)
MCH: 29.3 pg (ref 26.0–34.0)
MCH: 28.8 pg (ref 26.0–34.0)
MCHC: 34.6 g/dL (ref 30.0–36.0)
MCHC: 34 g/dL (ref 30.0–36.0)
MCV: 84.7 fL (ref 78.0–100.0)
MCV: 84.7 fL (ref 78.0–100.0)
Platelets: 108 10*3/uL — ABNORMAL LOW (ref 150–400)
RBC: 3 MIL/uL — ABNORMAL LOW (ref 4.22–5.81)
RDW: 15.1 % (ref 11.5–15.5)
WBC: 8.4 10*3/uL (ref 4.0–10.5)

## 2011-04-06 LAB — GLUCOSE, CAPILLARY
Glucose-Capillary: 107 mg/dL — ABNORMAL HIGH (ref 70–99)
Glucose-Capillary: 123 mg/dL — ABNORMAL HIGH (ref 70–99)
Glucose-Capillary: 153 mg/dL — ABNORMAL HIGH (ref 70–99)
Glucose-Capillary: 155 mg/dL — ABNORMAL HIGH (ref 70–99)

## 2011-04-06 LAB — PHOSPHORUS: Phosphorus: 4 mg/dL (ref 2.3–4.6)

## 2011-04-06 NOTE — Consult Note (Signed)
[name], [name] NO.:  [id number]  MEDICAL RECORD NO.:  [id number]  LOCATION:  3039                         FACILITY:  [facility]  PHYSICIAN:  [name], M.D. DATE OF BIRTH:  [birth year]  DATE OF CONSULTATION:  03/09/2011 DATE OF DISCHARGE:                                CONSULTATION   REASON FOR CONSULTATION:  Right forearm swelling.  REQUESTING SERVICE:  [facility], Grant Blood, MD  PRIMARY CARE PHYSICIAN:  None.  BRIEF HISTORY OF PRESENT ILLNESS:  [name] is a very pleasant 76 year old left-hand dominant African American male who presents with several day swelling of his right forearm which he states started on Friday.  He denies any acute trauma, no falls.  He did not hit his arm on any objects.  He denies any type of bite that he can recall.  However, he began to notice more recently that his arm is swelling and is having some pain in his right arm as well.  The patient denies any recent illnesses.  He has not really followed up with any doctor in about a decade but per his report, he has been relatively healthy.  He denies any medications at home as well.  He denies any fevers or chills.  No unintended weight loss.  No cough, no headaches.  No nausea, vomiting, or diarrhea.  The patient states that his pain is maximum at 8/10 with movement and dropped down to 2-3/10 with rest.  He has not tried any ice, no compression to the extremity as well.  The patient really denies any additional issues.  PAST MEDICAL HISTORY:  The patient denies.  ALLERGIES:  No known drug allergies.  MEDICATIONS:  Occasional aspirin or Advil for pain, but nothing on a regular basis.  SOCIAL HISTORY:  The patient lives in [place].  He does not smoke anymore, however, he used to.  He does maintain a fairly active lifestyle.  FAMILY HISTORY:  He denies any coagulopathies in his family, does have gout history in his brother.  SURGICAL HISTORY:   Denies.  REVIEW OF SYSTEMS:  As noted above in the HPI.  PHYSICAL EXAMINATION:  VITAL SIGNS:  Temperature 98.4, heart rate 56, respirations 16, 97% on room air, BP is 148/48. GENERAL:  The patient is a very pleasant 76 year old African American male who is in no acute distress.  Appropriate for stated age. EXTREMITIES:  Focused examination of his right upper extremity. Shoulder and elbow are without any acute findings  His volar aspect of his forearm as well as the medial aspect of his elbow extending into the lower portion of his upper arm is notable for some ecchymosis.  There is some diffuse swelling of the volar aspect of the forearm noted as well. Mild redness is appreciated, but mostly ecchymosis is noted.  The patient does have some tenderness with direct palpation along the FCR as well.  The patient demonstrates excellent strength with manual muscle testing with respect to digit extension and flexion as well as wrist flexion and extension as well.  No significant strength deficits noted. Radial, ulnar, median, and axillary nerve sensory function are intact as well.  Extremities are warm.  Palpable dorsalis pedis pulse appreciated. No obvious breaks in the integument are noted, appears to be stable.  No open wounds or draining wounds are noted.  The patient does not have any pain with passive stretching of his digits or his wrist as well.  Today's labs; sodium 142, potassium 4.0, chloride 109, bicarb 24, BUN 19, creatinine 1.41, glucose 82, calcium is 9.0.  White Holmes cells 6.9, hemoglobin 10.0, hematocrit 28.6, platelets 88.  Sed rate was slightly elevated at 29.  IMAGING:  CT and MRI of forearm did not demonstrate any osseous findings, they do appear to demonstrate findings of hematoma collection within the proximal forearm, musculature notably along the FCR.  ASSESSMENT AND PLAN:  A 76 year old [place] American male with apparent negative past medical history with  spontaneous hematoma in the right forearm.  1. Spontaneous hematoma, right forearm.  There does not appear to be     any acute surgical role at this time.  Would recommend ice and     compression to help with any residual swelling, does not appear     that this is infectious in nature.  The patient's vitals have been     excellent as well as his labs have not demonstrated any elevation     in white count.  Therefore, we believe that this is strictly a     spontaneous hematoma within the forearm.  This will likely resolve     on its own over the next several days.  We do not feel that     aspiration is indicated at this current time as well.  We would     strongly recommend coagulopathy workup as well given the fact that     his platelets appeared to be low and possible evaluation of anemia     as well. 2. Thrombocytopenia.  Please see above.  Again, recommended workup for     coagulopathy which may suggest a reason for spontaneous hematoma     within the forearm. 3. Disposition.  I greatly appreciate the ability over the opportunity     to consult on this patient.  We feel that his condition is     spontaneous hematoma within the right forearm.  Please contact us     with any additional questions or concerns if we can be of further     assistance.  I would also recommend discontinuing antibiotics as     well again as it does not appear to be infectious in nature, will     leave this up to the Primary Team.     [name], PA   ______________________________ [name], M.D.    KWP/MEDQ  D:  03/09/2011  T:  03/09/2011  Job:  [id number]  Electronically Signed by Grant Morita PA on 03/31/2011 12:07:28 PM Electronically Signed by [name] M.D. on 04/06/2011 06:47:13 PM

## 2011-04-07 LAB — GLUCOSE, CAPILLARY
Glucose-Capillary: 126 mg/dL — ABNORMAL HIGH (ref 70–99)
Glucose-Capillary: 120 mg/dL — ABNORMAL HIGH (ref 70–99)
Glucose-Capillary: 126 mg/dL — ABNORMAL HIGH (ref 70–99)
Glucose-Capillary: 128 mg/dL — ABNORMAL HIGH (ref 70–99)

## 2011-04-07 LAB — CROSSMATCH
ABO/RH(D): A POS
Antibody Screen: NEGATIVE
Unit division: 0

## 2011-04-07 LAB — BASIC METABOLIC PANEL
CO2: 30 mEq/L (ref 19–32)
Chloride: 105 mEq/L (ref 96–112)
Glucose, Bld: 128 mg/dL — ABNORMAL HIGH (ref 70–99)
Potassium: 4 mEq/L (ref 3.5–5.1)
Sodium: 146 mEq/L — ABNORMAL HIGH (ref 135–145)

## 2011-04-07 LAB — PHOSPHORUS: Phosphorus: 3.2 mg/dL (ref 2.3–4.6)

## 2011-04-07 LAB — MAGNESIUM: Magnesium: 2.2 mg/dL (ref 1.5–2.5)

## 2011-04-08 ENCOUNTER — Inpatient Hospital Stay (HOSPITAL_COMMUNITY): Payer: BC Managed Care – PPO

## 2011-04-08 LAB — BASIC METABOLIC PANEL
CO2: 30 mEq/L (ref 19–32)
Calcium: 9.1 mg/dL (ref 8.4–10.5)
GFR calc non Af Amer: 42 mL/min — ABNORMAL LOW (ref >90)
Glucose, Bld: 112 mg/dL — ABNORMAL HIGH (ref 70–99)
Potassium: 3.8 mEq/L (ref 3.5–5.1)
Sodium: 145 mEq/L (ref 135–145)

## 2011-04-08 LAB — GLUCOSE, CAPILLARY
Glucose-Capillary: 165 mg/dL — ABNORMAL HIGH (ref 70–99)
Glucose-Capillary: 137 mg/dL — ABNORMAL HIGH (ref 70–99)
Glucose-Capillary: 119 mg/dL — ABNORMAL HIGH (ref 70–99)

## 2011-04-08 LAB — CBC
Hemoglobin: 9.9 g/dL — ABNORMAL LOW (ref 13.0–17.0)
Platelets: 145 10*3/uL — ABNORMAL LOW (ref 150–400)
RBC: 3.37 MIL/uL — ABNORMAL LOW (ref 4.22–5.81)

## 2011-04-09 LAB — GLUCOSE, CAPILLARY
Glucose-Capillary: 114 mg/dL — ABNORMAL HIGH (ref 70–99)
Glucose-Capillary: 124 mg/dL — ABNORMAL HIGH (ref 70–99)
Glucose-Capillary: 143 mg/dL — ABNORMAL HIGH (ref 70–99)

## 2011-04-09 LAB — BASIC METABOLIC PANEL
CO2: 26 mEq/L (ref 19–32)
Calcium: 8.9 mg/dL (ref 8.4–10.5)
GFR calc non Af Amer: 47 mL/min — ABNORMAL LOW (ref >90)
Glucose, Bld: 108 mg/dL — ABNORMAL HIGH (ref 70–99)
Potassium: 4 mEq/L (ref 3.5–5.1)
Sodium: 141 mEq/L (ref 135–145)

## 2011-04-09 LAB — CBC
Hemoglobin: 9.9 g/dL — ABNORMAL LOW (ref 13.0–17.0)
MCH: 29.4 pg (ref 26.0–34.0)
Platelets: 151 10*3/uL (ref 150–400)
RBC: 3.37 MIL/uL — ABNORMAL LOW (ref 4.22–5.81)

## 2011-04-10 ENCOUNTER — Inpatient Hospital Stay (HOSPITAL_COMMUNITY): Payer: BC Managed Care – PPO

## 2011-04-10 DIAGNOSIS — I714 Abdominal aortic aneurysm, without rupture: Secondary | ICD-10-CM

## 2011-04-10 DIAGNOSIS — I739 Peripheral vascular disease, unspecified: Secondary | ICD-10-CM

## 2011-04-10 LAB — CBC
MCH: 29.3 pg (ref 26.0–34.0)
MCV: 84.6 fL (ref 78.0–100.0)
Platelets: 198 10*3/uL (ref 150–400)
RBC: 3.69 MIL/uL — ABNORMAL LOW (ref 4.22–5.81)
RDW: 15.9 % — ABNORMAL HIGH (ref 11.5–15.5)
WBC: 15.9 10*3/uL — ABNORMAL HIGH (ref 4.0–10.5)

## 2011-04-10 LAB — GLUCOSE, CAPILLARY
Glucose-Capillary: 118 mg/dL — ABNORMAL HIGH (ref 70–99)
Glucose-Capillary: 159 mg/dL — ABNORMAL HIGH (ref 70–99)

## 2011-04-10 NOTE — H&P (Signed)
[name], [name] NO.:  [id number]  MEDICAL RECORD NO.:  [id number]  LOCATION:  3039                         FACILITY:  [facility]  PHYSICIAN:  Lonia Blood, M.D.      DATE OF BIRTH:  31-May-1935  DATE OF ADMISSION:  03/08/2011 DATE OF DISCHARGE:                             HISTORY & PHYSICAL   PRIMARY CARE PHYSICIAN:  The patient is unassigned  PRESENTING COMPLAINT:  Right arm pain and swelling.  HISTORY OF PRESENT ILLNESS:  The patient is a 76 year old gentleman who practically has no major medical problems.  He has not seen a doctor in years in at least 10 years, has been pretty much healthy.  He started experiencing right elbow pain and swelling about 3 days ago which has persisted.  This continued to be painful.  The patient therefore decided to come to the emergency room.  Denied any fever or chills.  Denied any nausea, vomiting or diarrhea but there is limitation of movement from his elbow.  The pain is up to 10/10 at some point but currently down to 3/10.  Mainly worse with motion movement and relieved with rest.  He has not had any major problems before.  Denied any history of gout or any arthritis.  PAST MEDICAL HISTORY:  None.  ALLERGIES:  No known drug allergies.  MEDICATIONS:  The patient is not taking any medicine at the moment.  SOCIAL HISTORY:  He lives in [place].  He used to smoke about half a pack per day but quit about 5 years ago although he has picked it up on and off recently.  No tobacco, no alcohol use.  He is pretty much active and lives alone with good family support.  FAMILY HISTORY:  Denied any significant family history except for gout in his brother.  REVIEW OF SYSTEMS:  All systems reviewed are negative except per HPI.  PHYSICAL EXAMINATION:  VITAL SIGNS:  His temperature is 97.5, blood pressure initially 158/99, currently 160/104, his pulse is 91, respiratory rate 16, sats 99% on room air. GENERAL:  He is awake,  alert, oriented very pleasant man who is in no acute distress. HEENT:  PERRL.  EOMI.  No significant pallor, no jaundice.  No rhinorrhea. NECK:  Supple.  No JVD, no lymphadenopathy. RESPIRATORY:  He has good air entry bilaterally.  No wheezes, no rales. CARDIOVASCULAR:  He has S1-S2.  No murmur. ABDOMEN:  Soft, nontender with positive bowel sounds. EXTREMITIES:  No edema, cyanosis or clubbing. SKIN:  No significant rashes or ulcers. MUSCULOSKELETAL:  His right elbow is swollen, tender.  No fluctuance. There is some limitation of flexion and extension of the elbow. Swelling has progressed about 8 cm above the elbow and about 10 cm below the elbow.  No significant color change observed.  LABORATORY DATA:  Sodium 141, potassium 3.9, chloride 105, CO2 23, glucose 81, BUN 18, creatinine 1.25, with calcium 9.7.  Sed rate is 29. White count is 6.8, hemoglobin 11.0, with platelet of 100, normal differentials.  He has an MCV of 80.  CT forearm showed increased density within the proximal forearm musculature laterally suggestive of some acute hemorrhage or an ill-defined  mass.  It is typical appearance For focal myositis.  There is diffuse forearm subcutaneous edema nonspecific but possibly cellulitis.  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  ASSESSMENT:  This is a 76 year old gentleman with right elbow swollen, tenderness, and pain.  More than likely there is underlying myositis or hemorrhage but also cellulitis.  The patient denied any trauma, denied any known insect bite.  He has no white count and no fever but based on the findings, we will keep the differentials to infectious and noninfectious causes.  PLAN:  Our plan therefore will be: 1. Right elbow cellulitis.  Admit the patient, start him empirically     on vancomycin and Zosyn until we figure what is going on, get blood     cultures.  I will get an MRI of the  elbow for better     characterization of this lesion.  Based on the results, we will see     if he needs to see Orthopedic Surgery or not.  More than likely, he     will require their  inputs.  We may need to get a biopsy.     Fortunately, there is no fluid observed from the CT, so he does not     require any I and D at this point. 2. Possible gout.  Based on the films, the patient denied ever having     wrist pain or any gouty attacks.  Again, we will wait for the arch     of the MRI.  If needed,  we may need to do a arthrocentesis, get     some fluid and check it. 3. Anemia, cause is not entirely clear but probably chronic disease. 4. Thrombocytopenia.  Again, the cause is not entirely clear either     acute disease or chronic disease.  He denied any alcohol intake.     No history of liver disease, no history of bone tumor but again the     patient has not seen a doctor in 10 years, so if this continues to     be an issue, we will do further workup. 5. Increased blood pressure.  No history of hypertension as far as we     know, but blood pressure so far is consistently elevated here.  If     it continues like this, we may have to treat him and probably     diagnose him with hypertension.     Lonia Blood, M.D.     [name]  D:  03/08/2011  T:  03/08/2011  Job:  [id number]  Electronically Signed by Lonia Blood M.D. on 04/10/2011 03:17:17 PM

## 2011-04-11 LAB — GLUCOSE, CAPILLARY
Glucose-Capillary: 155 mg/dL — ABNORMAL HIGH (ref 70–99)
Glucose-Capillary: 99 mg/dL (ref 70–99)

## 2011-04-12 ENCOUNTER — Inpatient Hospital Stay (HOSPITAL_COMMUNITY): Payer: BC Managed Care – PPO

## 2011-04-12 LAB — GLUCOSE, CAPILLARY: Glucose-Capillary: 114 mg/dL — ABNORMAL HIGH (ref 70–99)

## 2011-04-13 ENCOUNTER — Inpatient Hospital Stay (HOSPITAL_COMMUNITY): Payer: BC Managed Care – PPO

## 2011-04-13 LAB — GLUCOSE, CAPILLARY
Glucose-Capillary: 114 mg/dL — ABNORMAL HIGH (ref 70–99)
Glucose-Capillary: 120 mg/dL — ABNORMAL HIGH (ref 70–99)

## 2011-04-13 LAB — CBC
HCT: 30.2 % — ABNORMAL LOW (ref 39.0–52.0)
Hemoglobin: 10.5 g/dL — ABNORMAL LOW (ref 13.0–17.0)
MCV: 83.4 fL (ref 78.0–100.0)
RBC: 3.62 MIL/uL — ABNORMAL LOW (ref 4.22–5.81)
RDW: 16 % — ABNORMAL HIGH (ref 11.5–15.5)
WBC: 10.6 10*3/uL — ABNORMAL HIGH (ref 4.0–10.5)

## 2011-04-13 LAB — DIFFERENTIAL
Basophils Absolute: 0 10*3/uL (ref 0.0–0.1)
Eosinophils Relative: 1 % (ref 0–5)
Lymphocytes Relative: 19 % (ref 12–46)
Lymphs Abs: 2.1 10*3/uL (ref 0.7–4.0)
Neutro Abs: 7.5 10*3/uL (ref 1.7–7.7)
Neutrophils Relative %: 70 % (ref 43–77)

## 2011-04-14 ENCOUNTER — Inpatient Hospital Stay (HOSPITAL_COMMUNITY): Payer: BC Managed Care – PPO

## 2011-04-14 DIAGNOSIS — R0902 Hypoxemia: Secondary | ICD-10-CM

## 2011-04-14 DIAGNOSIS — J449 Chronic obstructive pulmonary disease, unspecified: Secondary | ICD-10-CM

## 2011-04-14 DIAGNOSIS — R05 Cough: Secondary | ICD-10-CM

## 2011-04-14 LAB — GLUCOSE, CAPILLARY
Glucose-Capillary: 109 mg/dL — ABNORMAL HIGH (ref 70–99)
Glucose-Capillary: 94 mg/dL (ref 70–99)
Glucose-Capillary: 144 mg/dL — ABNORMAL HIGH (ref 70–99)

## 2011-04-14 NOTE — Discharge Summary (Addendum)
[name], [name] NO.:  [id number]  MEDICAL RECORD NO.:  [id number]  LOCATION:  2029                         FACILITY:  [facility]  PHYSICIAN:  Fransisco Hertz, MD       DATE OF BIRTH:  1935/03/25  DATE OF ADMISSION:  03/30/2011 DATE OF DISCHARGE:  04/13/2011                              DISCHARGE SUMMARY   CHIEF COMPLAINT:  Abdominal aortic aneurysm.  HISTORY OF PRESENT ILLNESS:  [name] is a 76 year old gentleman who presented with a chief complaint of arm swelling and pain.  Apparently on his workup was found of right forearm hematoma, etiology of which was unknown.  During his workup, he had anemia and thrombocytopenia and was seen by Hematology.  They felt that this may have been secondary to history of alcoholism and possible cirrhosis.  A CT scan of the abdomen and pelvis was done to evaluate his liver.  On that scan, he was found to have a large abdominal aortic aneurysm as well as an iliac artery aneurysm.  He was asymptomatic from this and denied any history of back or abdominal pain.  There is no family history of aneurysm disease or any type of connective tissue disorder.  He has no prior history of any type of claudication or rest pain in either lower extremity.  He had a workup by Hematology and was cleared for surgery.  He was brought in for an aortobifemoral bypass.  PAST MEDICAL HISTORY: 1. Forty pack-year smoking history.  History of alcohol abuse. 2. History of gout 3. Anemia and thrombocytopenia.  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 [name].  Postoperatively, the patient was successfully extubated.  He was kept in the Intensive Care Unit for several days.  His platelets were 82 which was stable after platelet transfusion.  His BUN and creatinine were 26 and 0.3.  H and H remained stable at 10 and 29.  His stools  were guaiaced.  His NG tube had some dark coffee-ground material and he had a stool which was dark in nature and GI was consulted.  He had no occult blood in his stool.  His LFTs were normal.  Otherwise, he was not bleeding.  His H and H, however, drifted down to 8.6 and 25.2 with a platelet count of 70.  He was seen by both GI and Hem/Onc.  He also had increased congestion and requiring some of good pulmonary toiletry with inhalers and nebulizers and Mucomyst helping clear some mucus plugs.  He was begun on ambulation. GI did an endoscopy on April 07, 2011 which was normal without any active bleeding.  He continued to improve over the next several days. His platelet count came up to 128 and his H and H was stable at 10 and 29.  He continued to have some respiratory issues with elevation of his hemidiaphragm with suspected by bibasilar atelectasis and pneumonia could not be ruled out.  However, he was afebrile and he is anywhere between than 93-95% 2 L of nasal cannula.  He continued  on Levaquin and has 4 more days of antibiotic dosage.  He will be discharged with Pulmonary follow up in 2 weeks.  Also follow up with Dr. [name], in next Friday.  FINAL DIAGNOSES: 1. Large abdominal aortic aneurysm, status post repair. 2. Chronic anemia and thrombocytopenia which was stable without     evidence of GI bleed or other bleeding while inhouse. 3. Atelectasis with a chest x-ray showing areas of right hemidiaphragm     and atelectasis in the right lower lobe, improved with inhalers     respiratory treatments and pulmonary toiletry and antibiotics.  DISPOSITION:  The patient will be discharged to home.  He will follow up with Pulmonary Service in 2 weeks and also Dr. Darrold Span in Heme/Onc in 2 weeks and Dr. [name] on October 26.  DISCHARGE MEDICATIONS: 1. Albuterol inhaler 1-2 puffs every 4-6 hours as needed. 2. Digoxin 0.125 mg daily. 3. Ensure 3 times a day. 4. Levaquin 750 mg daily for 4  days. 5. Megace 800 mg daily. 6. Metoprolol 25 mg twice daily. 7. Multivitamins daily. 8. Protonix 40 mg twice daily. 9. Vitamin D daily. 10.Ferrous gluconate daily. 11.Folic acid 1 mg daily. 12.Thiamin 1 tablet daily.     Della Goo, PA-C   ______________________________ Fransisco Hertz, MD    RR/MEDQ  D:  04/13/2011  T:  04/13/2011  Job:  [id number]  Electronically Signed by [name] MD on 04/14/2011 05:14:10 PM Electronically Signed by Della Goo PA on 04/20/2011 10:53:49 AM

## 2011-04-14 NOTE — Consult Note (Signed)
NAME:  [name], [name] NO.:  [id number]  MEDICAL RECORD NO.:  [id number]  LOCATION:  2304                         FACILITY:  [facility]  PHYSICIAN:  Reece Packer, M.D. DATE OF BIRTH:  1934/11/24  DATE OF CONSULTATION:  04/01/2011 DATE OF DISCHARGE:                                CONSULTATION   Thank you for asking me to see this very nice gentleman because of continued low blood counts, as I had met him during a recent hospitalization in September 2012 when he presented with apparent spontaneous bleed into his right upper extremity.  He had not had any medical care in over 10 years when he presented with the acute right upper extremity problem.  CBC on March 08, 2011 without priors for comparison.  White count 6.8, hemoglobin 11, platelets 100,000.  His platelets dropped to 84,000 during that hospitalization then improved to 124,000 by discharge.  The evaluation included CT abdomen and pelvis March 10, 2011 with normal size spleen.  No obvious cirrhotic changes of the liver and no significant adenopathy.  HIT screen was negative.  This was not obviously ITP, no evidence of DIC, no Von Willebrand, and no myeloma that is apparent.  He was iron deficient with fecal occult blood negative x1 and he was begun on oral iron during that admission.  He also shows moderate blood in preoperative urinalysis on March 25, 2011.  The right upper extremity has progressively improved.  He had an CBC at [facility] on March 30, 2011 with white count 6.3, hemoglobin 11.5, platelets 98,000.  He has no history of other bleeding problems and has not had further bleeding during the period between the September admission and this admission. He denies any alcohol in over 30 years.  He has been a long smoker, perhaps as much as 40 pack years, this discontinued during his last admission.  Two-view chest x-ray done March 11, 2011 showed hyperinflation and  chronic interstitial changes.  The CT abdomen and pelvis also showed simple cyst in his kidneys bilaterally with the urinary tract otherwise not remarkable.  That same CT identified the abdominal aortic aneurysm and iliac aneurysm, which have now been addressed surgically by Dr. [name].  REVIEW OF SYSTEMS:  The patient denies pain except when he coughs, cough now productive per the nurse.  His arm is much improved and he is able to extend the elbow almost fully.  He has had some smears of stool.  He had been voiding without difficulty prior to admission.  PAST HISTORY:  No known drug allergies.  He had liver and cervical lymph node biopsies in the 1960s when he was in the [employer] which he understands showed no problems.  No prior diagnosis of anemia or low platelet known though again he did not have any regular care in over 10 years and previously was seen only by a physician at his place of employment.  He has had no other surgery.  He does not presently have a primary MD and his wife sees Dr. Andi Devon.  We will ask that office if they will take a new patient.  FAMILY HISTORY:  Positive  for gout.  SOCIAL HISTORY:  He is married, originally from [place] and lives in [place], and was in the service.  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  He has smoked for 40 years, did stopped for about 5 years before recently restarting.  Alcohol 30-40 years ago and none since.  PHYSICAL EXAMINATION:  GENERAL:  He is awake and alert, fully oriented, appropriate, looks generally, comfortable up in chair with non- rebreather oxygen on. VITAL SIGNS:  Respirations not labored, temperature 97.8, blood pressure 124/70, respirations 22, and 100% saturated, heart rate 76 sinus on the monitor.  I greater than O at least 1.6 liters since October 2 with present IV fluid at a 100.  He has no  bleeding at the right IJ.  Has a blocked right peripheral IV.  The surgical sites have staples in place. No bleeding.  No erythema. LUNGS:  Have diminished breath sounds in the lower fields and some scattered coarse crackles. HEART:  Has a regular rate and rhythm. ABDOMEN:  Otherwise is soft to gentle palpation, quiet. LOWER EXTREMITIES:  No edema, cords and feet are warm.  Foley is in place.  Nurse tells me that they are using SCD when he is in bed.  The right upper extremity ecchymoses have almost completely resolved.  There is still an area of firm swelling in the upper medial right forearm, though this is diminished from when I had seen him previously.  LABORATORY DATA:  Labs preop on September 27, white count 6.7, hemoglobin 11.4, platelets 110,000.  October 2nd, white count 11,000, hemoglobin 8.5, platelets 79,000 which was postop and he was given 2 units of packed red cells and one pack of platelets.  Then on October 3rd white count 12.4, hemoglobin up to 10.2, platelets 82,000.  On October 4, white count 14.7, hemoglobin 8.5, platelets 62,000.  Sodium 142, potassium 4.2, chloride 110, CO2 27, glucose 160, BUN 27, creatinine 1.39, INR 1.3, PTT 37.  Medications have been reviewed.  No heparin use.  He has been started on Protonix rather than the present Pepcid and has Lasix ordered.  Pathology from 3 lymph nodes was admitted with his surgery on March 30, 2011, negative for malignancy ( [facility] 2012).  IMPRESSION/RECOMMENDATIONS: 1. Status post abdominal aortic aneurysm repair and repair of iliac     aneurysm, these problems having been found incidentally during the     hospitalization in September.  I have discussed with Dr. [name] on     the unit now. 2. Thrombocytopenia which was present on admission in September     duration not clear.  No overt bleeding now.  Would be fine to     transfuse platelets if vascular surgeon prefers count higher than     present 62,000 for now, or  certainly if bleeding.  Etiology of the     mild thrombocytopenia has not been identified from work up to this     point and we may consider bone marrow exam in the future, however,     I would think supporting with transfusions as above most     appropriate for now. 3. Iron deficiency anemia source also not identified, plus likely also     dilutional with I greater than O.  Agree with follow-up fecal     occult blood.  We may also need to look further at his urinary     tract  if fecal occult bloods are negative. 4. Long tobacco with underlying chronic obstructive pulmonary disease.  I will follow up again tomorrow.  Please call if needed between my rounds.     Reece Packer, M.D.     LPL/MEDQ  D:  04/01/2011  T:  04/01/2011  Job:  [id number]  cc:   Fransisco Hertz, MD  Electronically Signed by Jama Flavors M.D. on 04/14/2011 01:28:51 PM

## 2011-04-15 ENCOUNTER — Encounter: Payer: Self-pay | Admitting: Vascular Surgery

## 2011-04-15 DIAGNOSIS — R0902 Hypoxemia: Secondary | ICD-10-CM

## 2011-04-15 DIAGNOSIS — R05 Cough: Secondary | ICD-10-CM

## 2011-04-15 DIAGNOSIS — J449 Chronic obstructive pulmonary disease, unspecified: Secondary | ICD-10-CM

## 2011-04-15 NOTE — Consult Note (Signed)
NAMEWILBER, FINI NO.:  [id number]  MEDICAL RECORD NO.:  [id number]  LOCATION:  3312                         FACILITY:  [facility]  PHYSICIAN:  Jaryn L. Malon Kindle., M.D.DATE OF BIRTH:  03/27/1935  DATE OF CONSULTATION:  04/06/2011 DATE OF DISCHARGE:                                CONSULTATION   REQUESTED BY:  Dr. [name].  PRIMARY CARE DOCTOR:  None.  HISTORY:  The patient is a 76 year old African American male, who was admitted with an aortic aneurysm and on October 2 underwent aortobifemoral bypass graft.  He has done reasonably well postoperatively, but has continued to drop his hemoglobin and has had some bright-red blood when his NG tube was removed.  He has not bleeding heavily, but is requiring transfusion.  The patient denies any preoperative nonsteroidal use.  He has had a history of anemia and was initially hospitalized in September and was seen at that time and evaluated by Hematology after having a spontaneous bleed into his right upper extremity.  He had an evaluation there and there were some concerns that he could have liver disease, but CT scan of the abdomen showed a normal spleen and no signs whatsoever of chronic liver disease. There were no varices etc.  He was worked up for DIC, ITP, von Willebrand's, etc and all these were negative.  His fecal occult blood was negative.  He denies any history of ulcer disease or any previous colonoscopy or endoscopy.  He has had no alcohol now for 30 years.  He does have a long history of smoking cigarettes.  FAMILY HISTORY:  Negative for colon cancer or stomach cancer.  PAST MEDICAL HISTORY:  Recent aortobifemoral bypass graft.  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  SOCIAL HISTORY:  He is married and works with the [employer].  PHYSICAL  EXAMINATION:  VITAL SIGNS:  Unremarkable. GENERAL:  This is a very pleasant, alert and oriented African American male. EYES:  Sclerae nonicteric. LUNGS:  Clear anteriorly with rales at the bases. HEART:  Regular rate and rhythm without murmurs, rubs or gallops. ABDOMEN:  Mild expected postsurgical tenderness.  No masses palpated.  ASSESSMENT: 1. Drop in hemoglobin of unclear cause.  He did have some bright-red     blood coming out of the NG tube when this was removed and it is     possibly that he could have an upper gastrointestinal bleed.  I     think it is certainly reasonable at this point to go ahead and     evaluate for an upper gastrointestinal source.  1. Status post aortobifemoral bypass graft and we will proceed with an     upper endoscopy in the morning.  If he continues to remain anemic     at some point, he may need a colonoscopy after he has sufficiently     recovered from his surgery.          ______________________________ Llana Aliment Malon Kindle., M.D.     Waldron Session  D:  04/06/2011  T:  04/07/2011  Job:  [id number]  cc:   Fransisco Hertz, MD Reece Packer, M.D.  Electronically Signed by Carman Ching M.D. on 04/15/2011 12:30:14 PM

## 2011-04-22 ENCOUNTER — Encounter: Payer: Self-pay | Admitting: Vascular Surgery

## 2011-04-23 ENCOUNTER — Encounter: Payer: Self-pay | Admitting: Vascular Surgery

## 2011-04-23 ENCOUNTER — Ambulatory Visit (INDEPENDENT_AMBULATORY_CARE_PROVIDER_SITE_OTHER): Payer: BC Managed Care – PPO | Admitting: Vascular Surgery

## 2011-04-23 VITALS — BP 134/84 | HR 100 | Temp 97.6°F | Ht 71.0 in | Wt 119.3 lb

## 2011-04-23 DIAGNOSIS — I714 Abdominal aortic aneurysm, without rupture, unspecified: Secondary | ICD-10-CM

## 2011-04-23 DIAGNOSIS — I739 Peripheral vascular disease, unspecified: Secondary | ICD-10-CM

## 2011-04-23 HISTORY — DX: Abdominal aortic aneurysm, without rupture: I71.4

## 2011-04-23 HISTORY — DX: Abdominal aortic aneurysm, without rupture, unspecified: I71.40

## 2011-04-23 HISTORY — DX: Peripheral vascular disease, unspecified: I73.9

## 2011-04-23 NOTE — Progress Notes (Signed)
[facility]   [facility]  History of Present Illness  [name] is a 76 y.o. male who presents post-op Aobifemoral bypass and ligation of L CIA aneurysm (Date: 03/30/11).   The patient notes no abdominal or back pain.  He does note some constipation, but has been able to have routine bowel movement.  He no longer is taking narcotics.  He does note his appetite is somewhat limited..  Past Medical History, Past Surgical History, Social History, Family History, Medications, Allergies, and Review of Systems are unchanged from previous evaluation on 13 SEP 12.  Physical Examination  Filed Vitals:   04/23/11 0934  BP: 134/84  Pulse: 100  Temp: 97.6 F (36.4 C)    Vascular: Vessel Right Left  Radial Palpable Palpable  Ulnary Palpable Palpable  Brachial Palpable Palpable  Carotid Palpable, without bruit Palpable, without bruit  Aorta Non-palpable N/A  Femoral Palpable Palpable  Popliteal Non-palpable Non-palpable  PT Non-Palpable Non-Palpable  DP Non-Palpable Non-Palpable   Gastrointestinal: soft, NTND, -G/R, - HSM, - masses, - CVAT B, inc c/d/i, staples in place  Vascular: R groin inc c/d/i, small hematoma ; L groin inc c/d/i, bilateral femoral pulses easily palpable  Medical Decision Making  [name] is a 76 y.o. male who presents s/p successful Aobifemoral bypass and ligation of L CIA aneurysm.  I emphasized to the patient the importance of increasing his dietary intake to help him heal from the his operation  I discussed with the patient the importance of surveillance of the aortic graft.  I will order BLE ABI and aortic duplex for 3 months  The patient will follow up with [place] in 3 months with these studies.  Thank you for allowing [place] to participate in this patient's care.  [name], MD [facility] [place] Office: [phone] Pager: [phone]

## 2011-04-26 NOTE — Discharge Summary (Signed)
  NAMEMarland Holmes  TAISHAUN, LEVELS NO.:  [id number]  MEDICAL RECORD NO.:  [id number]  LOCATION:  2029                         FACILITY:  [facility]  PHYSICIAN:  Fransisco Hertz, MD       DATE OF BIRTH:  10/11/1934  DATE OF ADMISSION:  03/30/2011 DATE OF DISCHARGE:  04/15/2011                              DISCHARGE SUMMARY   ADDENDUM:  Chest x-ray on April 13, 2011, revealed persistent increase of right hemidiaphragm and findings were most likely related to volume loss and atelectasis.  There was decreased atelectasis at the left lung. Given this information, it was decided to send the patient for a  chest CT scan which revealed right lower lobe airspace disease and right endobronchial free fluid/debris.  Consider infection or aspiration. There is also left-sided patchy reticulonodular opacities likely also infection or aspiration.  Atypical etiologies should be considered. There was pulmonary artery enlargement suggestive of pulmonary arterial hypertension.  There was probable scarring in the superior segment of the left lower lobe.  As the patient is at low risk for bronchogenic carcinoma, follow up chest CT in 12 months is recommended.  This recommendation consist statement for guidelines for management of small pulmonary nodules detected on CT scan.  There is also mild right peritracheal adenopathy likely reactive.  They recommended attention on follow up.  Indeterminate right-sided thyroid nodule.  Nonemergent thyroid ultrasound may be informative.  Due to this information, Pulmonary was reconsulted.  They recommend followup chest CT in 6-12 months, and will add Ultram for cough.  They will follow up with the patient outpatient in 2 weeks.  The patient is discharged home on April 15, 2011.  New medication added to the patient's regimen is Ultram 50 mg p.o. t.i.d.     Newton Pigg, PA   ______________________________ Fransisco Hertz, MD    SE/MEDQ  D:   04/15/2011  T:  04/15/2011  Job:  [id number]  Electronically Signed by Newton Pigg PA on 04/21/2011 08:45:51 AM Electronically Signed by [name] MD on 04/26/2011 09:58:41 AM

## 2011-04-27 NOTE — Consult Note (Signed)
NAMEROB, [name] NO.:  [id number]  MEDICAL RECORD NO.:  [id number]  LOCATION:  2029                         FACILITY:  [facility]  PHYSICIAN:  Grant Evener, MD  DATE OF BIRTH:  1935/06/07  DATE OF CONSULTATION:  04/14/2011 DATE OF DISCHARGE:                                CONSULTATION   CHIEF COMPLAINT:  Cough, question aspiration.  HISTORY OF PRESENT ILLNESS:  [name] is a 76 year old male, current smoker with extensive smoking history who was admitted on March 30, 2011, for AAA repair and fem-pop bypass.  Course was complicated by anemia and thrombocytopenia.  On April 14, 2011, at which time the patient was anticipated discharge home, the patient brought up a complaint of approximately 3-6 months of cough.  CT chest was done to evaluate and there was question right lower lobe consolidation and aspiration as well as cough and Pulmonary Critical Care was called to evaluate prior to discharge.  ALLERGIES:  No known drug allergies. Title.  PAST SURGERY HISTORY:  Significant only for AAA and now status post repair.  CURRENT MEDICATIONS: 1. Vitamin B12. 2. Digoxin. 3. Ensure. 4. Ferrous sulfate. 5. Folic acid. 6. Heparin for DVT prophylaxis. 7. NovoLog sliding scale insulin. 8. Levaquin. 9. Megace. 10.Lopressor. 11.Multivitamin. 12.Protonix. 13.Vitamin B1. 14.P.r.n.  Tylenol. 15.P.r.n. Chloraseptic. 16.P.r.n. hydralazine.  SOCIAL HISTORY:  The patient lives in [place] with his wife.  He does have a history of significant ETOH; however, he quit many years ago, but does have some questionable cirrhosis.  The patient has an extensive smoking history.  Smoked approximately 45-50 years with his heaviest being 2 packs per day and currently smokes one-half pack per day, approximately 75 pack-year history.  FAMILY HISTORY:  Noncontributory.  REVIEW OF SYSTEMS:  The patient does complain of cough, which is worse at night.  He has been  experiencing this on off for 3-6 months.  Cough is productive of small amount of white sputum.  The patient denies shortness of breath, dyspnea on exertion, chest pain, wheezing, or edema.  The patient denies significant GERD symptoms.  Denies abdominal pain.  Denies dysphagia or difficulty eating or drinking.  Denies fevers chills, nausea, or vomiting.  All other systems reviewed and were negative.  PHYSICAL EXAMINATION:  VITAL SIGNS:  Temperature 97.4, pulse 75, respirations 18, blood pressure 114/68, sats 95% on 2 L on nasal cannula. GENERAL:  This is a thin elderly male in no acute distress in bed, very pleasant. NEURO:  He is alert and oriented.  Cranial nerves are intact.  Strength 5/5 in all extremities. HEENT:  Mucous membranes are moist.  Dentition is good.  Sclerae are clear. NECK:  Supple without lymphadenopathy.  No thyromegaly.  No bruit, no JVD. CARDIAC:  S1 and S2.  Regular rate and rhythm.  No murmurs, rubs, or gallops. PULMONARY:  Respirations are even and nonlabored on nasal cannula.  Does have some scattered rhonchi, left greater than right bases posteriorly. ABDOMEN:  Soft, nontender, nondistended.  Positive bowel sounds. EXTREMITIES:  Warm and dry.  No edema.  RADIOLOGY DATA:  CT of the chest on April 14, 2011, shows right lower lobe airspace disease with  right endobronchial tree consistent with infection or aspiration, also left-sided patchy reticulonodular opacities, pulmonary artery enlargement suggesting pulmonary arterial hypertension.  Probable scarring in the superior segment of the left lower lobe.  If the patient is at high risk for bronchogenic carcinoma, followup CT in 6-12 months.  Mild right paratracheal adenopathy likely reactive and indeterminate right-sided thyroid nodule.  Chest x-ray on April 13, 2011, shows persistent elevation of the right hemidiaphragm and right greater than left atelectasis.  LABORATORY DATA:  Most recent laboratory  data April 13, 2011, CBC; white blood cells 10.6, hemoglobin 10.5, hematocrit 30.2, and platelets 230.  IMPRESSION AND PLAN:  Cough.  The patient has had this for approximately 3-6 months, worse at night, although it does happen intermittently throughout the day.  The patient has no overt signs of dysphagia or aspiration; however, this was queried on CT scan.  We would recommend max GERD Rx, which he currently has already.  We can try Ultram for cough, consider swallow evaluation.  We would follow up CT scan in 6-12 months.  We would be happy to see the patient as an outpatient for further workup of chronic cough.  I do not feel that this is necessarily keeping him here in the hospital would be okay with discharge and further outpatient work up.    Grant Dress, NP   ______________________________ Grant Evener, MD   KW/MEDQ  D:  04/14/2011  T:  04/15/2011  Job:  [id number]  cc:   Grant Hertz, MD  Electronically Signed by Danford Bad N.P. on 04/21/2011 04:25:08 PM Electronically Signed by Koren Bound MD on 04/27/2011 12:33:50 PM

## 2011-05-05 ENCOUNTER — Encounter: Payer: Self-pay | Admitting: Internal Medicine

## 2011-05-06 ENCOUNTER — Encounter: Payer: Self-pay | Admitting: Internal Medicine

## 2011-05-06 ENCOUNTER — Institutional Professional Consult (permissible substitution): Payer: BC Managed Care – PPO | Admitting: Pulmonary Disease

## 2011-05-06 ENCOUNTER — Ambulatory Visit (INDEPENDENT_AMBULATORY_CARE_PROVIDER_SITE_OTHER): Payer: BC Managed Care – PPO | Admitting: Internal Medicine

## 2011-05-06 ENCOUNTER — Ambulatory Visit (INDEPENDENT_AMBULATORY_CARE_PROVIDER_SITE_OTHER)
Admission: RE | Admit: 2011-05-06 | Discharge: 2011-05-06 | Disposition: A | Discharge status: Home / Self Care | Payer: BC Managed Care – PPO | Source: Ambulatory Visit | Attending: Internal Medicine | Admitting: Internal Medicine

## 2011-05-06 VITALS — BP 118/80 | HR 93 | Temp 97.4°F | Ht 71.0 in | Wt 122.2 lb

## 2011-05-06 DIAGNOSIS — J9819 Other pulmonary collapse: Secondary | ICD-10-CM

## 2011-05-06 DIAGNOSIS — J9811 Atelectasis: Secondary | ICD-10-CM

## 2011-05-06 DIAGNOSIS — J449 Chronic obstructive pulmonary disease, unspecified: Secondary | ICD-10-CM

## 2011-05-06 HISTORY — DX: Chronic obstructive pulmonary disease, unspecified: J44.9

## 2011-05-06 NOTE — Assessment & Plan Note (Signed)
I had an extended discussion with the patient today lasting 15 to 20 minutes of a 25 minute visit on the following issues:  I took an extended  opportunity with this patient to outline the consequences of continued cigarette use  in airway disorders based on all the data we have from the multiple national lung health studies (perfomed over decades at millions of dollars in cost)  indicating that smoking cessation, not choice of inhalers or physicians, is the most important aspect of care.    Should continue to improve with maint off cigs but def needs f/u with pft's   See instructions for specific recommendations which were reviewed directly with the patient who was given a copy with highlighter outlining the key components.

## 2011-05-06 NOTE — Patient Instructions (Addendum)
Please remember to go to the  x-ray department downstairs for your tests - we will call you with the results when then are available.   The most important aspect of your care is to eat better and avoid smoking at all costs   Please schedule a follow up office visit in 4 weeks, sooner if needed with pft's on return  Late add needs f/u cxr also placed in tickle

## 2011-05-06 NOTE — Progress Notes (Signed)
Quick Note:  Spoke with pt and notified of results per Dr. Wert. Pt verbalized understanding and denied any questions.  ______ 

## 2011-05-06 NOTE — Assessment & Plan Note (Signed)
cxr better but has somewhat localized rhonchi in R base and definitely needs f/u

## 2011-05-06 NOTE — Progress Notes (Signed)
  Subjective:    Patient ID: [name], male    DOB: Jan 15, 1935, 76 y.o.   MRN: [id number]  HPI  Vascular surgery [name] Family uses [name] for primary care Pancytopenia f/u [name]  75 yobm quit smoking at AAA surgery 03/30/11 with no problems prior to surgery and atx R base post op eval by [name] and referred to pulmonary clinic for f/u   05/06/2011 Initial pulmonary office eval in EMR era cc minimal cough, no limits with breathing though not fully back to nl activities,  Rarely using inhalers.  Sleeping ok without nocturnal  or early am exacerbation  of respiratory  c/o's or need for noct saba. Also denies any obvious fluctuation of symptoms with weather or environmental changes or other aggravating or alleviating factors except as outlined above.  Review of Systems  Constitutional: Positive for appetite change and unexpected weight change. Negative for fever, chills and activity change.  HENT: Negative for congestion, sore throat, rhinorrhea, sneezing, trouble swallowing, dental problem, voice change and postnasal drip.   Eyes: Negative for visual disturbance.  Respiratory: Positive for cough. Negative for choking and shortness of breath.   Cardiovascular: Negative for chest pain and leg swelling.  Gastrointestinal: Negative for nausea, vomiting and abdominal pain.  Genitourinary: Negative for difficulty urinating.  Musculoskeletal: Negative for arthralgias.  Skin: Negative for rash.  Psychiatric/Behavioral: Negative for behavioral problems and confusion.       Objective:   Physical Exam Thin amb bm nad with mod congested sounding cough Wt 122 05/06/2011 HEENT mild turbinate edema.  Oropharynx no thrush or excess pnd or cobblestoning.  No JVD or cervical adenopathy. Mild accessory muscle hypertrophy. Trachea midline, nl thryroid. Chest was hyperinflated by percussion with diminished breath sounds and moderate increased exp time with localized insp/exp rhonchi RLL and  Hoover  sign positive at mid inspiration. Regular rate and rhythm without murmur gallop or rub or increase P2 or edema.  Abd: no hsm, nl excursion. Ext warm without cyanosis ? Mild clubbing?   CXR  05/06/2011 :  Improvement in aeration right base with minimal residual atelectasis or scarring. No new infiltrate noted. No pulmonary edema.      Assessment & Plan:

## 2011-05-26 ENCOUNTER — Other Ambulatory Visit: Payer: Self-pay | Admitting: Oncology

## 2011-05-26 ENCOUNTER — Ambulatory Visit (HOSPITAL_BASED_OUTPATIENT_CLINIC_OR_DEPARTMENT_OTHER): Payer: BC Managed Care – PPO | Admitting: Oncology

## 2011-05-26 ENCOUNTER — Other Ambulatory Visit (HOSPITAL_BASED_OUTPATIENT_CLINIC_OR_DEPARTMENT_OTHER): Payer: BC Managed Care – PPO | Admitting: Lab

## 2011-05-26 ENCOUNTER — Encounter: Payer: Self-pay | Admitting: Oncology

## 2011-05-26 VITALS — BP 95/59 | HR 60 | Temp 97.5°F | Ht 71.0 in | Wt 120.9 lb

## 2011-05-26 DIAGNOSIS — J4489 Other specified chronic obstructive pulmonary disease: Secondary | ICD-10-CM

## 2011-05-26 DIAGNOSIS — D509 Iron deficiency anemia, unspecified: Secondary | ICD-10-CM

## 2011-05-26 DIAGNOSIS — D696 Thrombocytopenia, unspecified: Secondary | ICD-10-CM

## 2011-05-26 DIAGNOSIS — J449 Chronic obstructive pulmonary disease, unspecified: Secondary | ICD-10-CM

## 2011-05-26 DIAGNOSIS — B370 Candidal stomatitis: Secondary | ICD-10-CM

## 2011-05-26 HISTORY — DX: Iron deficiency anemia, unspecified: D50.9

## 2011-05-26 LAB — CBC WITH DIFFERENTIAL/PLATELET
Basophils Absolute: 0.1 10*3/uL (ref 0.0–0.1)
EOS%: 1.3 % (ref 0.0–7.0)
Eosinophils Absolute: 0.1 10*3/uL (ref 0.0–0.5)
HCT: 30.7 % — ABNORMAL LOW (ref 38.4–49.9)
HGB: 10.3 g/dL — ABNORMAL LOW (ref 13.0–17.1)
MCH: 27.5 pg (ref 27.2–33.4)
MCV: 81.9 fL (ref 79.3–98.0)
MONO%: 10.5 % (ref 0.0–14.0)
NEUT#: 4.9 10*3/uL (ref 1.5–6.5)
NEUT%: 51.2 % (ref 39.0–75.0)
lymph#: 3.5 10*3/uL — ABNORMAL HIGH (ref 0.9–3.3)

## 2011-05-26 MED ORDER — LEVOFLOXACIN 500 MG PO TABS: 500.0000 mg | ORAL_TABLET | Freq: Every day | ORAL | Status: AC

## 2011-05-26 MED ORDER — FLUCONAZOLE 100 MG PO TABS: 100.0000 mg | ORAL_TABLET | Freq: Every day | ORAL | Status: AC

## 2011-05-27 NOTE — Progress Notes (Signed)
OFFICE PROGRESS NOTE INTERVAL HISTORY Date of Visit 05-26-11        Physicians: M.Wert, B.Chen, J.Edwards, B.Crenshaw  Patient is seen, alone for visit today, in follow up of his iron deficiency anemia and previous thrombocytopenia, these diagnoses made Sept 2012 when he presented with an apparent spontaneous bleed into RUE; during all of that evaluation he was also found to have a significant  AAA and iliac aneurysm. He had no splenomegaly or cirrhotic liver findings, no adenopathy, fecal occult blood was negative, no evidence of HIT, ITP, coagulopathy, no myeloma, did have moderate blood in a urinalysis. He has not had colonoscopy or urology evaluation. He stopped cigarettes that admission, 40 year smoking history. Platelets ranged from ~ 85K to 120K and Hgb ~ 10 - 10.5. The LUE bleed resolved. 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 He saw Dr.Chen last late Oct, and Dr.Wert on Nov 8; he has not been able to establish with a primary care MD (had requested Dr.K.Renae Gloss, who sees his wife, however her next available new patient appointment is ~ 8 months out per my phone conversation with that office now). He is very agreeable to referral to primary care with [facility] and hopefully Dr.Wert can assist with this. Mr.Scarbro has been slowly improving since surgery, however in past 24 hours has begun with chest congestion and productive cough. Prior to respiratory symptoms, he has still had poor appetite altho he tries to drink ~ 3 Ensure daily. Usual weight was 160 lbs prior to last 6-12 months. He denies overt bleeding, fever, increased SOB, surgical pain, residual discomfort RUE. He has some taste changes and mild throat discomfort. Bowels have been moving and no bladder symptoms.He denies pain.  He has had flu shot. Note that prior to  hospitalization in Sept, he had no medical care for at least 10 years. He did have preop cardiology evaluation. Last CBC in EMR 04-13-11 WBC 10.6, Hgb 10.5, plt 230k.  He has not continued oral iron as previously started.  Review of systems otherwise, full 10 point negative. Objective:  Vital signs in last 24 hours:  BP 95/59  Pulse 60  Temp(Src) 97.5 F (36.4 C) (Oral)  Ht 5\' 11"  (1.803 m)  Wt 120 lb 14.4 oz (54.84 kg)  BMI 16.86 kg/m2 Alert, very pleasant, looks mildly ill with intermittent cough, no labored respirations, ambulatory without assistance   HEENT:mucous membranes moist, pharynx normal without lesions and thrush noted LymphaticsCervical, supraclavicular, and axillary nodes normal. Resp: normal percussion bilaterally, no use accessory muscles, coarse crackles lower posterior felds bilaterally and left lower anterior, no wheeze. Cardio: regular rate and rhythm GI: soft, non-tender; bowel sounds normal; no masses,  no organomegaly. Midline surgical incision well-healed and nontender. Extremities: no edema, cords or tenderness. Muscle wasting lower legs, diminished hair growth. RUE still some discoloration but no residual swelling/hematoma  Skin without rash, ecchymoses, petechiae.}    Lab Results:   Basename 05/26/11 0851  WBC 9.7  HGB 10.3*  HCT 30.7*  PLT 140  MCV 81.9, differential not remarkable  BMET No results found for this basename: NA:2,K:2,CL:2,CO2:2,GLUCOSE:2,BUN:2,CREATININE:2,CALCIUM:2 in the last 72 hours  Studies/Results:  No results found.  Medications: I have reviewed the patient's current medications.  I am concerned with new pulmonary symptoms in this gentleman with significant underlying COPD. I will begin levaquin 500 mg today and daily with  mucinex bid; I have spoken with Dr.Wert's office, his PA to see Mr.Henigan in follow up on Dec.3 @ 10am. The oral thrush may be causing some of the difficulty eating. I have also prescribed 10 days of  diflucan 100 mg (in order to continue the diflucan beyond present antibiotic course). Assessment/Plan:  1. Iron deficiency anemia: source not clear, tho complete work up delayed for the urgent vascular surgery. Samples of Integra given and his pharmacist will give additional OTC equivalent per my phone call there now. I will see him back at least in Jan. With CBC, iron studies and repeat urinalysis. He may need to be seen again by GI and/or by urology. 2. Thrombocytopenia: etiology not clear, tho this may have been related to the AAA. Follow counts as above. 3. New lower respiratory symptoms with underlying significant COPD/ long past tobacco: levaquin and f/u pulmonary as above 4. No primary MD:  Would like referral to [place] primary care if possible. 5.Oral thrush: diflucan as above  Patient was given oral and written instructions for all of above. He was comfortable with plan.        Orla Jolliff P, MD   05/27/2011, 1:38 PM

## 2011-05-28 ENCOUNTER — Telehealth: Payer: Self-pay

## 2011-05-28 NOTE — Telephone Encounter (Signed)
SPOKE WITH [name] AND ASKED HIM IF HE NEEDED A NOTE FOR WORK FROM DR. Darrold Span STATING THAT HE COULD RETURN LATER THAN 05-31-11. HE SAID THAT HE DOES NOT NEED THE NOTE.  HE HAS DECIDED TO RETIRE.  HE APPRECIATED THE CALL.

## 2011-05-31 ENCOUNTER — Ambulatory Visit (INDEPENDENT_AMBULATORY_CARE_PROVIDER_SITE_OTHER): Payer: BC Managed Care – PPO | Admitting: Adult Health

## 2011-05-31 ENCOUNTER — Encounter: Payer: Self-pay | Admitting: Adult Health

## 2011-05-31 VITALS — BP 108/68 | HR 106 | Temp 98.8°F | Ht 71.0 in | Wt 123.2 lb

## 2011-05-31 DIAGNOSIS — J449 Chronic obstructive pulmonary disease, unspecified: Secondary | ICD-10-CM

## 2011-05-31 NOTE — Progress Notes (Signed)
  Subjective:    Patient ID: [name], male    DOB: Mar 11, 1935, 76 y.o.   MRN: [id number]  HPI  Vascular surgery [name] Family uses [name] for primary care Pancytopenia f/u [name]  75 yobm quit smoking at AAA surgery 03/30/11 with no problems prior to surgery and atx R base post op eval by Molli Knock and referred to pulmonary clinic for f/u   05/06/2011 Initial pulmonary office eval in EMR era cc minimal cough, no limits with breathing though not fully back to nl activities,  Rarely using inhalers.  Sleeping ok without nocturnal  or early am exacerbation  of respiratory  c/o's or need for noct saba. Also denies any obvious fluctuation of symptoms with weather or environmental changes or other aggravating or alleviating factors except as outlined above.  05/31/2011 Follow up  Pt presents for follow up of bronchitis flare. Had prod cough with white mucus x5days. Seen by Dr. Darrold Span on  11.28.12 and was given levaquin 500mg   mucinex, diflucan for thrush He is feeling much better. Still has some cough but only clear to white mucus Has ov next week with Dr. Sherene Sires  With PFTs  No hemoptysis or fever.    Review of Systems    Constitutional:   No  weight loss, night sweats,  Fevers, chills,  +fatigue, or  lassitude.  HEENT:   No headaches,  Difficulty swallowing,  Tooth/dental problems, or  Sore throat,                No sneezing, itching, ear ache, nasal congestion, post nasal drip,   CV:  No chest pain,  Orthopnea, PND, swelling in lower extremities, anasarca, dizziness, palpitations, syncope.   GI  No heartburn, indigestion, abdominal pain, nausea, vomiting, diarrhea, change in bowel habits, loss of appetite, bloody stools.   Resp:    No coughing up of blood.   No chest wall deformity  Skin: no rash or lesions.  GU: no dysuria, change in color of urine, no urgency or frequency.  No flank pain, no hematuria   MS:  No joint pain or swelling.  No decreased range of motion.  No back  pain.  Psych:  No change in mood or affect. No depression or anxiety.  No memory loss.      Objective:   Physical Exam Thin amb bm nad with mod congested sounding cough Wt 122 05/06/2011 >>123 05/31/2011  HEENT mild turbinate edema.  Oropharynx no thrush or excess pnd or cobblestoning.  No JVD or cervical adenopathy. Mild accessory muscle hypertrophy. Trachea midline, nl thryroid. Chest was hyperinflated by percussion with diminished breath sounds  . Regular rate and rhythm without murmur gallop or rub or increase P2 or edema.  Abd: no hsm, nl excursion. Ext warm without cyanosis     CXR  05/06/2011 :  Improvement in aeration right base with minimal residual atelectasis or scarring. No new infiltrate noted. No pulmonary edema.      Assessment & Plan:

## 2011-05-31 NOTE — Assessment & Plan Note (Addendum)
Resolving flare   Plan Finish Levaquin  Mucinex DM Twice daily  As needed  Cough/congestion  follow up Dr. Sherene Sires  As planned next week for lung tests.  Please contact office for sooner follow up if symptoms do not improve or worsen or seek emergency care

## 2011-05-31 NOTE — Patient Instructions (Signed)
Finish Levaquin  Mucinex DM Twice daily  As needed  Cough/congestion  follow up Dr. Sherene Sires  As planned next week for lung tests.  Please contact office for sooner follow up if symptoms do not improve or worsen or seek emergency care

## 2011-06-01 ENCOUNTER — Telehealth: Payer: Self-pay | Admitting: *Deleted

## 2011-06-03 NOTE — Telephone Encounter (Signed)
Pt returned call, advised to keep PFT and appt with MW and will have cxr at visit.  Pt okay with this and verbalized his understanding.  Nothing further needed.

## 2011-06-03 NOTE — Telephone Encounter (Signed)
Per MW- the pt will need to have cxr done at next ov- he is scheduled for PFT and rov with MW 06/10/11, so called the pt to remind him to be sure and keep appts and that a cxr will be obtained same day. LMTCB

## 2011-06-04 ENCOUNTER — Ambulatory Visit: Payer: BC Managed Care – PPO

## 2011-06-08 ENCOUNTER — Encounter (HOSPITAL_COMMUNITY): Payer: Self-pay | Admitting: Emergency Medicine

## 2011-06-08 ENCOUNTER — Inpatient Hospital Stay (HOSPITAL_COMMUNITY)
Admission: EM | Admit: 2011-06-08 | Discharge: 2011-06-15 | DRG: 575 | Disposition: A | Discharge status: Home / Self Care | Payer: BC Managed Care – PPO | Attending: Internal Medicine | Admitting: Internal Medicine

## 2011-06-08 ENCOUNTER — Emergency Department (HOSPITAL_COMMUNITY): Payer: BC Managed Care – PPO

## 2011-06-08 ENCOUNTER — Other Ambulatory Visit: Payer: Self-pay | Admitting: Oncology

## 2011-06-08 ENCOUNTER — Other Ambulatory Visit: Payer: Self-pay

## 2011-06-08 DIAGNOSIS — D72829 Elevated white blood cell count, unspecified: Secondary | ICD-10-CM | POA: Diagnosis not present

## 2011-06-08 DIAGNOSIS — R079 Chest pain, unspecified: Secondary | ICD-10-CM | POA: Insufficient documentation

## 2011-06-08 DIAGNOSIS — E872 Acidosis, unspecified: Secondary | ICD-10-CM | POA: Diagnosis not present

## 2011-06-08 DIAGNOSIS — J449 Chronic obstructive pulmonary disease, unspecified: Secondary | ICD-10-CM

## 2011-06-08 DIAGNOSIS — I2699 Other pulmonary embolism without acute cor pulmonale: Secondary | ICD-10-CM

## 2011-06-08 DIAGNOSIS — D591 Autoimmune hemolytic anemia, unspecified: Principal | ICD-10-CM | POA: Diagnosis present

## 2011-06-08 DIAGNOSIS — R531 Weakness: Secondary | ICD-10-CM

## 2011-06-08 DIAGNOSIS — D649 Anemia, unspecified: Secondary | ICD-10-CM

## 2011-06-08 DIAGNOSIS — D589 Hereditary hemolytic anemia, unspecified: Secondary | ICD-10-CM

## 2011-06-08 DIAGNOSIS — D509 Iron deficiency anemia, unspecified: Secondary | ICD-10-CM | POA: Diagnosis present

## 2011-06-08 DIAGNOSIS — R001 Bradycardia, unspecified: Secondary | ICD-10-CM

## 2011-06-08 DIAGNOSIS — I714 Abdominal aortic aneurysm, without rupture: Secondary | ICD-10-CM

## 2011-06-08 DIAGNOSIS — J4489 Other specified chronic obstructive pulmonary disease: Secondary | ICD-10-CM | POA: Diagnosis present

## 2011-06-08 DIAGNOSIS — D696 Thrombocytopenia, unspecified: Secondary | ICD-10-CM | POA: Diagnosis not present

## 2011-06-08 DIAGNOSIS — I82409 Acute embolism and thrombosis of unspecified deep veins of unspecified lower extremity: Secondary | ICD-10-CM | POA: Diagnosis not present

## 2011-06-08 DIAGNOSIS — I498 Other specified cardiac arrhythmias: Secondary | ICD-10-CM | POA: Diagnosis not present

## 2011-06-08 DIAGNOSIS — E876 Hypokalemia: Secondary | ICD-10-CM | POA: Diagnosis not present

## 2011-06-08 DIAGNOSIS — I739 Peripheral vascular disease, unspecified: Secondary | ICD-10-CM | POA: Diagnosis present

## 2011-06-08 HISTORY — DX: Shortness of breath: R06.02

## 2011-06-08 HISTORY — DX: Peripheral vascular disease, unspecified: I73.9

## 2011-06-08 HISTORY — DX: Chronic obstructive pulmonary disease, unspecified: J44.9

## 2011-06-08 HISTORY — DX: Abdominal aortic aneurysm, without rupture: I71.4

## 2011-06-08 LAB — DIFFERENTIAL
Blasts: 0 %
Eosinophils Absolute: 0 10*3/uL (ref 0.0–0.7)
Eosinophils Relative: 0 % (ref 0–5)
Lymphocytes Relative: 18 % (ref 12–46)
Lymphs Abs: 1.9 10*3/uL (ref 0.7–4.0)
Monocytes Absolute: 0.8 10*3/uL (ref 0.1–1.0)
Monocytes Relative: 7 % (ref 3–12)
Neutro Abs: 8 10*3/uL — ABNORMAL HIGH (ref 1.7–7.7)
Neutrophils Relative %: 75 % (ref 43–77)
nRBC: 0 /100 WBC

## 2011-06-08 LAB — CBC
MCV: 108.9 fL — ABNORMAL HIGH (ref 78.0–100.0)
Platelets: 188 10*3/uL (ref 150–400)
RBC: 1.58 MIL/uL — ABNORMAL LOW (ref 4.22–5.81)
RDW: 32.2 % — ABNORMAL HIGH (ref 11.5–15.5)
WBC: 10.7 10*3/uL — ABNORMAL HIGH (ref 4.0–10.5)

## 2011-06-08 LAB — COMPREHENSIVE METABOLIC PANEL
ALT: 12 U/L (ref 0–53)
BUN: 21 mg/dL (ref 6–23)
CO2: 19 mEq/L (ref 19–32)
Calcium: 9.5 mg/dL (ref 8.4–10.5)
Creatinine, Ser: 1.38 mg/dL — ABNORMAL HIGH (ref 0.50–1.35)
GFR calc Af Amer: 57 mL/min — ABNORMAL LOW (ref >90)
GFR calc non Af Amer: 49 mL/min — ABNORMAL LOW (ref >90)
Glucose, Bld: 147 mg/dL — ABNORMAL HIGH (ref 70–99)
Sodium: 137 mEq/L (ref 135–145)
Total Protein: 7.9 g/dL (ref 6.0–8.3)

## 2011-06-08 LAB — FOLATE: Folate: >20 ng/mL

## 2011-06-08 LAB — PROTIME-INR
INR: 1.2 (ref 0.00–1.49)
Prothrombin Time: 15.8 seconds — ABNORMAL HIGH (ref 11.6–15.2)

## 2011-06-08 LAB — URINALYSIS, ROUTINE W REFLEX MICROSCOPIC
Bilirubin Urine: NEGATIVE
Leukocytes, UA: NEGATIVE
Nitrite: NEGATIVE
Specific Gravity, Urine: 1.01 (ref 1.005–1.030)
Urobilinogen, UA: 2 mg/dL — ABNORMAL HIGH (ref 0.0–1.0)

## 2011-06-08 LAB — IRON AND TIBC
Iron: 30 ug/dL — ABNORMAL LOW (ref 42–135)
UIBC: 133 ug/dL (ref 125–400)

## 2011-06-08 LAB — HEMOGLOBIN AND HEMATOCRIT, BLOOD
HCT: 14.5 % — ABNORMAL LOW (ref 39.0–52.0)
Hemoglobin: 4.6 g/dL — CL (ref 13.0–17.0)
Hemoglobin: 4.6 g/dL — CL (ref 13.0–17.0)

## 2011-06-08 LAB — SEDIMENTATION RATE: Sed Rate: >140 mm/hr — ABNORMAL HIGH (ref 0–16)

## 2011-06-08 LAB — PREPARE RBC (CROSSMATCH)

## 2011-06-08 LAB — TECHNOLOGIST SMEAR REVIEW

## 2011-06-08 LAB — PRO B NATRIURETIC PEPTIDE: Pro B Natriuretic peptide (BNP): 2717 pg/mL — ABNORMAL HIGH (ref 0–125)

## 2011-06-08 LAB — SAVE SMEAR

## 2011-06-08 LAB — RETICULOCYTES: Retic Count, Absolute: 566.6 10*3/uL — ABNORMAL HIGH (ref 19.0–186.0)

## 2011-06-08 MED ORDER — DIGOXIN 125 MCG PO TABS
125.0000 ug | ORAL_TABLET | Freq: Every day | ORAL | Status: DC
  Administered 2011-06-08 – 2011-06-12 (×4): 125 ug via ORAL
  Filled 2011-06-08 (×6): qty 1

## 2011-06-08 MED ORDER — POTASSIUM CHLORIDE CRYS ER 20 MEQ PO TBCR
40.0000 meq | EXTENDED_RELEASE_TABLET | Freq: Once | ORAL | Status: AC
  Administered 2011-06-08: 40 meq via ORAL
  Filled 2011-06-08: qty 2

## 2011-06-08 MED ORDER — ONDANSETRON HCL 4 MG PO TABS: 4.0000 mg | ORAL_TABLET | Freq: Four times a day (QID) | ORAL | Status: DC | PRN

## 2011-06-08 MED ORDER — DIPHENHYDRAMINE HCL 50 MG/ML IJ SOLN: 25.0000 mg | Freq: Four times a day (QID) | INTRAMUSCULAR | Status: DC | PRN

## 2011-06-08 MED ORDER — FUROSEMIDE 10 MG/ML IJ SOLN: 10.0000 mg | Freq: Once | INTRAMUSCULAR | Status: DC

## 2011-06-08 MED ORDER — SODIUM CHLORIDE 0.9 % IV SOLN
8.0000 mg/h | INTRAVENOUS | Status: DC
  Administered 2011-06-08 – 2011-06-12 (×10): 8 mg/h via INTRAVENOUS
  Filled 2011-06-08 (×24): qty 80

## 2011-06-08 MED ORDER — SODIUM CHLORIDE 0.9 % IV SOLN
INTRAVENOUS | Status: AC
  Administered 2011-06-08: via INTRAVENOUS

## 2011-06-08 MED ORDER — ALBUTEROL SULFATE (5 MG/ML) 0.5% IN NEBU: 2.5000 mg | INHALATION_SOLUTION | RESPIRATORY_TRACT | Status: DC | PRN

## 2011-06-08 MED ORDER — HYDROCODONE-ACETAMINOPHEN 5-325 MG PO TABS: 1.0000 | ORAL_TABLET | ORAL | Status: DC | PRN

## 2011-06-08 MED ORDER — GUAIFENESIN-DM 100-10 MG/5ML PO SYRP: 5.0000 mL | ORAL_SOLUTION | ORAL | Status: DC | PRN

## 2011-06-08 MED ORDER — PANTOPRAZOLE SODIUM 40 MG IV SOLR
80.0000 mg | Freq: Once | INTRAVENOUS | Status: AC
  Administered 2011-06-08: 80 mg via INTRAVENOUS
  Filled 2011-06-08: qty 80

## 2011-06-08 MED ORDER — IOHEXOL 300 MG/ML  SOLN
150.0000 mL | Freq: Once | INTRAMUSCULAR | Status: AC | PRN
  Administered 2011-06-08: 40 mL via INTRAVENOUS

## 2011-06-08 MED ORDER — IOHEXOL 350 MG/ML SOLN
100.0000 mL | Freq: Once | INTRAVENOUS | Status: AC | PRN
  Administered 2011-06-08: 100 mL via INTRAVENOUS

## 2011-06-08 MED ORDER — FERROUS SULFATE 325 (65 FE) MG PO TABS
325.0000 mg | ORAL_TABLET | Freq: Every day | ORAL | Status: DC
  Administered 2011-06-09: 325 mg via ORAL
  Filled 2011-06-08 (×2): qty 1

## 2011-06-08 MED ORDER — SODIUM CHLORIDE 0.9 % IV BOLUS (SEPSIS)
500.0000 mL | Freq: Once | INTRAVENOUS | Status: AC
  Administered 2011-06-08: 500 mL via INTRAVENOUS

## 2011-06-08 MED ORDER — DM-GUAIFENESIN ER 30-600 MG PO TB12
1.0000 | ORAL_TABLET | Freq: Two times a day (BID) | ORAL | Status: DC
  Administered 2011-06-08 – 2011-06-13 (×10): 1 via ORAL
  Administered 2011-06-14: 2 via ORAL
  Administered 2011-06-14: 1 via ORAL
  Administered 2011-06-15: 2 via ORAL
  Filled 2011-06-08 (×18): qty 2

## 2011-06-08 MED ORDER — ONDANSETRON HCL 4 MG/2ML IJ SOLN: 4.0000 mg | Freq: Four times a day (QID) | INTRAMUSCULAR | Status: DC | PRN

## 2011-06-08 NOTE — ED Notes (Signed)
Patient transported to CT 

## 2011-06-08 NOTE — ED Notes (Signed)
Spoke to blood bank.  Blood is not ready due to the fact that blood bank is attempting to contact MD first.

## 2011-06-08 NOTE — ED Notes (Signed)
Signed blood consent obtained and placed in patient chart

## 2011-06-08 NOTE — Consult Note (Signed)
Reason for Consult: Anemia Referring Physician: Critical care  [name] is an 76 y.o. male.  HPI: Patient is seen and examined and discussed with the patient and his wife as well as the critical care team . He has a history of anemia and has seen a hematologist but has never had a GI workup although CT scans have been okay and other than decreased appetite he has no GI complaints and has not specifically seen blood but does not see doctors very often but is not aware of having outpatient guaiacs checked. His family history is negative for any GI problems Past Medical History  Diagnosis Date  . AAA (abdominal aortic aneurysm)   . Anemia   . Thrombocytopenia   . Iron deficiency anemia 05/26/2011  . Abdominal aneurysm without mention of rupture 04/23/2011  . Peripheral arterial disease 04/23/2011  . COPD (chronic obstructive pulmonary disease) 05/06/2011    Past Surgical History  Procedure Date  . Abdominal aortic aneurysm repair   . Pr vein bypass graft,[id number]-pop     Family History  Problem Relation Age of Onset  . Cancer Mother     breast  . Hypertension Father   . Diabetes Sister   . COPD Brother     Social History:  reports that he quit smoking about 2 months ago. His smoking use included Cigarettes. He has a 25 pack-year smoking history. He has never used smokeless tobacco. He reports that he does not drink alcohol or use illicit drugs.  Allergies: No Known Allergies  Medications: I have reviewed the patient's current medications.  Results for orders placed during the hospital encounter of 06/08/11 (from the past 48 hour(s))  CBC     Status: Abnormal   Collection Time   06/08/11  3:21 AM      Component Value Range Comment   WBC 10.7 (*) 4.0 - 10.5 (K/uL)    RBC 1.58 (*) 4.22 - 5.81 (MIL/uL)    Hemoglobin 5.6 (*) 13.0 - 17.0 (g/dL)    HCT 63.8 (*) 75.6 - 52.0 (%)    MCV 108.9 (*) 78.0 - 100.0 (fL)    MCH 35.4 (*) 26.0 - 34.0 (pg)    MCHC 32.6  30.0 - 36.0 (g/dL)     RDW 43.3 (*) 29.5 - 15.5 (%)    Platelets 188  150 - 400 (K/uL)   DIFFERENTIAL     Status: Abnormal   Collection Time   06/08/11  3:21 AM      Component Value Range Comment   Neutrophils Relative 75  43 - 77 (%)    Lymphocytes Relative 18  12 - 46 (%)    Monocytes Relative 7  3 - 12 (%)    Eosinophils Relative 0  0 - 5 (%)    Basophils Relative 0  0 - 1 (%)    Band Neutrophils 0  0 - 10 (%)    Metamyelocytes Relative 0      Myelocytes 0      Promyelocytes Absolute 0      Blasts 0      nRBC 0  0 (/100 WBC)    Neutro Abs 8.0 (*) 1.7 - 7.7 (K/uL)    Lymphs Abs 1.9  0.7 - 4.0 (K/uL)    Monocytes Absolute 0.8  0.1 - 1.0 (K/uL)    Eosinophils Absolute 0.0  0.0 - 0.7 (K/uL)    Basophils Absolute 0.0  0.0 - 0.1 (K/uL)    RBC Morphology MARKED POLYCHROMASIA  HOWELL/JOLLY BODIES  COMPREHENSIVE METABOLIC PANEL     Status: Abnormal   Collection Time   06/08/11  3:21 AM      Component Value Range Comment   Sodium 137  135 - 145 (mEq/L)    Potassium 3.2 (*) 3.5 - 5.1 (mEq/L)    Chloride 100  96 - 112 (mEq/L)    CO2 19  19 - 32 (mEq/L)    Glucose, Bld 147 (*) 70 - 99 (mg/dL)    BUN 21  6 - 23 (mg/dL)    Creatinine, Ser 1.61 (*) 0.50 - 1.35 (mg/dL)    Calcium 9.5  8.4 - 10.5 (mg/dL)    Total Protein 7.9  6.0 - 8.3 (g/dL)    Albumin 3.2 (*) 3.5 - 5.2 (g/dL)    AST 30  0 - 37 (U/L)    ALT 12  0 - 53 (U/L)    Alkaline Phosphatase 98  39 - 117 (U/L)    Total Bilirubin 5.2 (*) 0.3 - 1.2 (mg/dL)    GFR calc non Af Amer 49 (*) >90 (mL/min)    GFR calc Af Amer 57 (*) >90 (mL/min)   TYPE AND SCREEN     Status: Normal (Preliminary result)   Collection Time   06/08/11  4:50 AM      Component Value Range Comment   ABO/RH(D) A POS      Antibody Screen POS      Sample Expiration 06/11/2011      DAT, IgG POS      Antibody Identification        Value: WARM AUTOANTIBODY WITH POSSBILE ANTI C UNDERNEATH WARM AUTOANTIBODY.  SAMPLES SENT TO [facility] IN [place] ON 12.11.12 FOR  ANTIBODY WORKUP   Antibody ID,T [place] WARM AUTOANTIBODY      DAT, complement NEG      Unit Number [id number]      Blood Component Type RED CELLS,LR      Unit division 00      Status of Unit ALLOCATED      Transfusion Status OK TO TRANSFUSE      Crossmatch Result LEAST INCOMPATIBLE      Unit tag comment VERBAL ORDERS PER DR     PREPARE RBC (CROSSMATCH)     Status: Normal   Collection Time   06/08/11  4:50 AM      Component Value Range Comment   Order Confirmation ORDER PROCESSED BY BLOOD BANK     PROTIME-INR     Status: Abnormal   Collection Time   06/08/11  4:55 AM      Component Value Range Comment   Prothrombin Time 15.5 (*) 11.6 - 15.2 (seconds)    INR 1.20  0.00 - 1.49    OCCULT BLOOD, POC DEVICE     Status: Normal   Collection Time   06/08/11  5:47 AM      Component Value Range Comment   Fecal Occult Bld NEGATIVE     VITAMIN B12     Status: Abnormal   Collection Time   06/08/11  8:13 AM      Component Value Range Comment   Vitamin B-12 958 (*) 211 - 911 (pg/mL)   FOLATE     Status: Normal   Collection Time   06/08/11  8:13 AM      Component Value Range Comment   Folate >20.0     IRON AND TIBC     Status: Abnormal   Collection Time  06/08/11  8:13 AM      Component Value Range Comment   Iron 30 (*) 42 - 135 (ug/dL)    TIBC 119 (*) 147 - 435 (ug/dL)    Saturation Ratios 18 (*) 20 - 55 (%)    UIBC 133  125 - 400 (ug/dL)   FERRITIN     Status: Abnormal   Collection Time   06/08/11  8:13 AM      Component Value Range Comment   Ferritin 2988 (*) 22 - 322 (ng/mL) Result confirmed by automatic dilution.  RETICULOCYTES     Status: Abnormal   Collection Time   06/08/11  8:13 AM      Component Value Range Comment   Retic Ct Pct 42.6 (*) 0.4 - 3.1 (%) RESULTS CONFIRMED BY MANUAL DILUTION   RBC. 1.33 (*) 4.22 - 5.81 (MIL/uL)    Retic Count, Manual 566.6 (*) 19.0 - 186.0 (K/uL)   LACTATE DEHYDROGENASE     Status: Abnormal   Collection Time   06/08/11  8:13 AM       Component Value Range Comment   LD 344 (*) 94 - 250 (U/L) HEMOLYSIS AT THIS LEVEL MAY AFFECT RESULT  HEMOGLOBIN AND HEMATOCRIT, BLOOD     Status: Abnormal   Collection Time   06/08/11  8:13 AM      Component Value Range Comment   Hemoglobin 4.6 (*) 13.0 - 17.0 (g/dL)    HCT 82.9 (*) 56.2 - 52.0 (%)   TECHNOLOGIST SMEAR REVIEW     Status: Normal   Collection Time   06/08/11  8:13 AM      Component Value Range Comment   Path Review MARKED POLYCHROMASIA     PROTIME-INR     Status: Abnormal   Collection Time   06/08/11  8:13 AM      Component Value Range Comment   Prothrombin Time 15.8 (*) 11.6 - 15.2 (seconds)    INR 1.23  0.00 - 1.49    URINALYSIS, ROUTINE W REFLEX MICROSCOPIC     Status: Abnormal   Collection Time   06/08/11 10:30 AM      Component Value Range Comment   Color, Urine YELLOW  YELLOW     APPearance CLEAR  CLEAR     Specific Gravity, Urine 1.010  1.005 - 1.030     pH 5.0  5.0 - 8.0     Glucose, UA NEGATIVE  NEGATIVE (mg/dL)    Hgb urine dipstick NEGATIVE  NEGATIVE     Bilirubin Urine NEGATIVE  NEGATIVE     Ketones, ur NEGATIVE  NEGATIVE (mg/dL)    Protein, ur NEGATIVE  NEGATIVE (mg/dL)    Urobilinogen, UA 2.0 (*) 0.0 - 1.0 (mg/dL)    Nitrite NEGATIVE  NEGATIVE     Leukocytes, UA NEGATIVE  NEGATIVE  MICROSCOPIC NOT DONE ON URINES WITH NEGATIVE PROTEIN, BLOOD, LEUKOCYTES, NITRITE, OR GLUCOSE <1000 mg/dL.  HEMOGLOBIN AND HEMATOCRIT, BLOOD     Status: Abnormal   Collection Time   06/08/11 11:57 AM      Component Value Range Comment   Hemoglobin 4.8 (*) 13.0 - 17.0 (g/dL)    HCT 13.0 (*) 86.5 - 52.0 (%)   PREPARE RBC (CROSSMATCH)     Status: Normal   Collection Time   06/08/11 12:01 PM      Component Value Range Comment   Order Confirmation ORDER PROCESSED BY BLOOD BANK     PRO B NATRIURETIC PEPTIDE     Status: Abnormal  Collection Time   06/08/11 12:02 PM      Component Value Range Comment   Pro B Natriuretic peptide (BNP) 2717.0 (*) 0 - 125 (pg/mL)   SAVE  SMEAR     Status: Normal   Collection Time   06/08/11 12:03 PM      Component Value Range Comment   Smear Review SMEAR STAINED AND AVAILABLE FOR REVIEW     SEDIMENTATION RATE     Status: Abnormal   Collection Time   06/08/11  1:00 PM      Component Value Range Comment   Sed Rate >140 (*) 0 - 16 (mm/hr)   POCT I-STAT TROPONIN I     Status: Normal   Collection Time   06/08/11  1:20 PM      Component Value Range Comment   Troponin i, poc 0.02  0.00 - 0.08 (ng/mL)    Comment 3              Ct Abdomen Pelvis Wo Contrast  06/08/2011  *RADIOLOGY REPORT*  Clinical Data: Anemia.  History of abdominal aortic aneurysm. COPD.  Right lower quadrant pain.  CT ABDOMEN AND PELVIS WITHOUT CONTRAST  Technique:  Multidetector CT imaging of the abdomen and pelvis was performed following the standard protocol without intravenous contrast.  Comparison: Earlier today CTA of the abdomen at 0500 hours.Abdominal pelvic CT of 03/10/2011.  Findings: Scarring at the right lung base anteriorly.  Developing airspace disease at the right lung base.  Mild scarring at the left lung base.  Cardiomegaly without pericardial or pleural effusion.  Exam degraded by lack of IV contrast.  There is contrast within the renal collecting systems and bladder from the CTA of earlier in the day.  Normal liver, spleen, stomach, pancreas.  Gallstones without acute cholecystitis or biliary ductal dilatation.  Normal adrenal glands. Right greater than left bilateral renal cysts.  Repair of abdominal aortic aneurysm.  Better evaluated on earlier contrast enhanced study.  No surrounding hemorrhage or retroperitoneal adenopathy.  Sigmoid diverticulosis with probable muscular hypertrophy.  Normal terminal ileum. Appendix not visualized.  Normal caliber of small bowel loops.  No pneumatosis, free intraperitoneal air, or ascites.  No pelvic adenopathy.  Normal appearance of the anastomotic sites within the groins bilaterally.  No evidence of complicating  fluid collection or surrounding hemorrhage.  Urinary bladder is mildly distended.  Moderate prostatomegaly. No significant free fluid.  Mild osteopenia.  Degenerative partial fusion of the bilateral sacroiliac joints.  Lower lumbar spondylosis.  IMPRESSION:  1.  No acute findings within the pelvis. 2.  No interval change within the abdomen since the CTA of the abdomen of 4 hours ago. 3.  Cholelithiasis.  4.  Degraded exam secondary lack of IV contrast. 5.  Suspect developing right base infection or aspiration.  Original Report Authenticated By: Consuello Bossier, M.D.   Dg Ribs Unilateral W/chest Right  06/08/2011  *RADIOLOGY REPORT*  Clinical Data: Cough, congestion, near syncope, right-sided chest and abdomen pain  RIGHT RIBS AND CHEST - 3+ VIEW  Comparison: 05/06/2011  Findings: Shallow inspiration.  Emphysematous changes in the lungs. Scattered fibrosis.  No focal airspace consolidation.  Normal heart size and pulmonary vascularity.  Tortuous and ectatic aorta.  No pneumothorax.  Right ribs appear intact without displaced fracture.  IMPRESSION: Emphysematous changes and fibrosis in the lungs.  No evidence of active pulmonary disease.  No displaced right rib fractures.  Original Report Authenticated By: Marlon Pel, M.D.   Ct Angio Chest W/cm &/  or Wo Cm  06/08/2011  *RADIOLOGY REPORT*  Clinical Data:  New onset abdominal pain, back pain, and rib cage pain.  CT ANGIOGRAPHY CHEST, ABDOMEN AND PELVIS  Technique:  Multidetector CT imaging through the chest and abdomen was performed using the standard protocol during bolus administration of intravenous contrast.  Multiplanar reconstructed images including MIPs were obtained and reviewed to evaluate the vascular anatomy.  Contrast: OMNIPAQUE IOHEXOL 350 MG/ML IV SOLN  Comparison:  CT chest 04/14/2011  CTA CHEST  Findings:  Unenhanced CT images of the chest and abdomen demonstrate thoracic aortic ectasia with calcification.  Coronary artery  calcification.  No evidence of intramural thrombus.  Tortuous and ectatic thoracic aorta.  Maximal AP diameter is about 3.5 cm in the ascending aorta.  No evidence of dissection.  No abnormal mediastinal or pleural fluid collections.  No significant lymphadenopathy in the chest.  No pleural effusion.  A filling defect is demonstrated and anterior right upper lung pulmonary artery suggesting subsegmental pulmonary embolus.  No other pulmonary arterial filling defects are demonstrated.  Emphysematous changes and interstitial fibrosis in the lungs.  Atelectasis in the lung bases.  No pneumothorax.  Nodules seen previously in the superior segment of the left lower lung is not visualized today and may have been inflammatory. Enlarged nodular thyroid gland as previously demonstrated.   Review of the MIP images confirms the above findings.  IMPRESSION: Ectatic thoracic aorta without aneurysm or dissection.  Filling defect in a right upper lung pulmonary arteries suggesting subsegmental pulmonary embolus.  Emphysema and fibrosis in the lungs.  CTA ABDOMEN  Findings:  The study is abdominal and extends to the upper aspect of the iliac crest.  This does not include the entire aneurysm repair.  Distal aneurysm sac and repair remains indeterminate as it is not visualized.  There is an infrarenal abdominal aortic aneurysm post repair. Calcification of the native sac with thrombosis of the native sac and patency of the graft.  No retroperitoneal fluid collection or contrast extravasation.  Atherosclerotic changes in the upper abdominal aorta.  Vascular calcifications in the branch vessels. Superior mesenteric artery, celiac axis, and renal artery origins appear patent.  The inferior mesenteric artery is not demonstrated.Calcified granulomas in the spleen.  The liver parenchyma is homogeneous.  Cholelithiasis.  No bile duct dilatation.  The pancreas and adrenal glands are unremarkable. Suggestion of small stones versus vascular  calcifications throughout the kidneys.  No evidence of obstruction.  Cyst in the right kidney measures about 3.3 cm diameter.  Visualized stomach, small bowel, and large bowel are decompressed.  No free air or free fluid in the abdomen.  Degenerative changes in the thoracic and lumbar spine.   Review of the MIP images confirms the above findings.  IMPRESSION: Postoperative change with repair of abdominal aortic aneurysm. This is not completely included on the study.  No retroperitoneal fluid collection or contrast extravasation demonstrated in the visualized abdomen.  No free air or free fluid in the abdomen. Cholelithiasis.  Results discussed with Dr. Norlene Campbell at the time of dictation, [phone] hours on 06/08/2011.  Original Report Authenticated By: Marlon Pel, M.D.   Ct Angio Abdomen W/cm &/or Wo Contrast  06/08/2011  *RADIOLOGY REPORT*  Clinical Data:  New onset abdominal pain, back pain, and rib cage pain.  CT ANGIOGRAPHY CHEST, ABDOMEN AND PELVIS  Technique:  Multidetector CT imaging through the chest and abdomen was performed using the standard protocol during bolus administration of intravenous contrast.  Multiplanar reconstructed images including  MIPs were obtained and reviewed to evaluate the vascular anatomy.  Contrast: OMNIPAQUE IOHEXOL 350 MG/ML IV SOLN  Comparison:  CT chest 04/14/2011  CTA CHEST  Findings:  Unenhanced CT images of the chest and abdomen demonstrate thoracic aortic ectasia with calcification.  Coronary artery calcification.  No evidence of intramural thrombus.  Tortuous and ectatic thoracic aorta.  Maximal AP diameter is about 3.5 cm in the ascending aorta.  No evidence of dissection.  No abnormal mediastinal or pleural fluid collections.  No significant lymphadenopathy in the chest.  No pleural effusion.  A filling defect is demonstrated and anterior right upper lung pulmonary artery suggesting subsegmental pulmonary embolus.  No other pulmonary arterial filling defects are  demonstrated.  Emphysematous changes and interstitial fibrosis in the lungs.  Atelectasis in the lung bases.  No pneumothorax.  Nodules seen previously in the superior segment of the left lower lung is not visualized today and may have been inflammatory. Enlarged nodular thyroid gland as previously demonstrated.   Review of the MIP images confirms the above findings.  IMPRESSION: Ectatic thoracic aorta without aneurysm or dissection.  Filling defect in a right upper lung pulmonary arteries suggesting subsegmental pulmonary embolus.  Emphysema and fibrosis in the lungs.  CTA ABDOMEN  Findings:  The study is abdominal and extends to the upper aspect of the iliac crest.  This does not include the entire aneurysm repair.  Distal aneurysm sac and repair remains indeterminate as it is not visualized.  There is an infrarenal abdominal aortic aneurysm post repair. Calcification of the native sac with thrombosis of the native sac and patency of the graft.  No retroperitoneal fluid collection or contrast extravasation.  Atherosclerotic changes in the upper abdominal aorta.  Vascular calcifications in the branch vessels. Superior mesenteric artery, celiac axis, and renal artery origins appear patent.  The inferior mesenteric artery is not demonstrated.Calcified granulomas in the spleen.  The liver parenchyma is homogeneous.  Cholelithiasis.  No bile duct dilatation.  The pancreas and adrenal glands are unremarkable. Suggestion of small stones versus vascular calcifications throughout the kidneys.  No evidence of obstruction.  Cyst in the right kidney measures about 3.3 cm diameter.  Visualized stomach, small bowel, and large bowel are decompressed.  No free air or free fluid in the abdomen.  Degenerative changes in the thoracic and lumbar spine.   Review of the MIP images confirms the above findings.  IMPRESSION: Postoperative change with repair of abdominal aortic aneurysm. This is not completely included on the study.  No  retroperitoneal fluid collection or contrast extravasation demonstrated in the visualized abdomen.  No free air or free fluid in the abdomen. Cholelithiasis.  Results discussed with Dr. Norlene Campbell at the time of dictation, [phone] hours on 06/08/2011.  Original Report Authenticated By: Marlon Pel, M.D.    ROS negative except above Blood pressure 122/70, pulse 87, temperature 98.1 F (36.7 C), temperature source Oral, resp. rate 26, SpO2 99.00%. Physical Exam no acute distress vital signs stable afebrile sclera nonicteric lungs are clear decreased heart sounds abdomen is thin nontender good bowel sounds no pedal edema guaiac-negative. High ferritin low iron TIBC and % sat more compatible with anemia of chronic disease  Assessment/Plan: Multiple medical problems including symptomatic anemia and current PE requiring probable anticoagulation Plan the risks benefits and methods of endoscopic studies was discussed and will proceed tomorrow with an endoscopy to rule out any upper tract at risk lesions and when stable and possibly as an outpatient and when okay  with surgical team based on recent happy to proceed with a screening colonoscopy just to be sure and I discussed the above with the patient and his wife  [facility] E 06/08/2011, 2:24 PM

## 2011-06-08 NOTE — ED Notes (Signed)
Called blood bank blood currently not available at this time.

## 2011-06-08 NOTE — ED Provider Notes (Signed)
History     CSN: [id number] Arrival date & time: 06/08/2011  3:11 AM   First MD Initiated Contact with Patient 06/08/11 0430      Chief Complaint  Patient presents with  . Abdominal Pain    (Consider location/radiation/quality/duration/timing/severity/associated sxs/prior treatment) HPI 76 year old male presents to emergency room with his family with complaint of right-sided chest pain and right upper abdominal pain starting 48 hours ago. Patient reports no pain at this time. Family reports he's been increasingly short of breath, dyspnea on exertion and generalized weakness over the last 3-4 days area patient is followed by pulmonology for COPD and hematology for anemia. He does not have a primary care doctor. Patient denies any hematuria, hematemesis, or blood in stool. Patient is status post AAA repair 2 months ago. He denies any fever chills or vomiting. He denies any trauma.  Past Medical History  Diagnosis Date  . AAA (abdominal aortic aneurysm)   . Anemia   . Thrombocytopenia   . Iron deficiency anemia 05/26/2011    Past Surgical History  Procedure Date  . Abdominal aortic aneurysm repair   . Pr vein bypass graft,[id number]     Family History  Problem Relation Age of Onset  . Cancer Mother     breast  . Hypertension Father   . Diabetes Sister   . COPD Brother     History  Substance Use Topics  . Smoking status: Former Smoker -- 0.5 packs/day for 50 years    Types: Cigarettes    Quit date: 03/30/2011  . Smokeless tobacco: Never Used  . Alcohol Use: No      Review of Systems  All other systems reviewed and are negative.    Allergies  Review of patient's allergies indicates no known allergies.  Home Medications   Current Outpatient Rx  Name Route Sig Dispense Refill  . ALBUTEROL SULFATE HFA 108 (90 BASE) MCG/ACT IN AERS Inhalation Inhale 1-2 puffs into the lungs every 4 (four) hours as needed.      Marland Kitchen DM-GUAIFENESIN ER 30-600 MG PO TB12 Oral Take  1-2 tablets by mouth every 12 (twelve) hours.      Marland Kitchen DIGOXIN 0.125 MG PO TABS Oral Take 125 mcg by mouth daily.      Arnette Schaumann PLUS PO CAPS Oral Take 1 capsule by mouth daily.      Marland Kitchen FERROUS SULFATE 325 (65 FE) MG PO TABS Oral Take 325 mg by mouth daily with breakfast.      . LOPRESSOR PO Oral Take 25 mg by mouth daily.     . MULTIVITAMINS PO CAPS Oral Take 1 capsule by mouth daily.      Marland Kitchen ENSURE PO Oral Take by mouth daily.      Marland Kitchen PROTONIX PO Oral Take 40 mg by mouth 2 (two) times daily.       BP 90/43  Pulse 70  Temp(Src) 98.7 F (37.1 C) (Oral)  Resp 27  SpO2 100%  Physical Exam  Nursing note and vitals reviewed. Constitutional: He is oriented to person, place, and time. He appears well-developed.       Frail and thin appearing  HENT:  Head: Normocephalic and atraumatic.  Right Ear: External ear normal.  Left Ear: External ear normal.  Nose: Nose normal.  Mouth/Throat: Oropharynx is clear and moist.  Eyes: Conjunctivae and EOM are normal. Pupils are equal, round, and reactive to light.  Neck: Normal range of motion. Neck supple. No JVD present. No tracheal deviation present. No  thyromegaly present.  Cardiovascular: Normal rate, regular rhythm, normal heart sounds and intact distal pulses.  Exam reveals no gallop and no friction rub.   No murmur heard. Pulmonary/Chest: Effort normal and breath sounds normal. No stridor. No respiratory distress. He has no wheezes. He has no rales. He exhibits no tenderness.  Abdominal: Soft. Bowel sounds are normal. He exhibits no distension and no mass. There is tenderness. There is no rebound and no guarding.       Midline surgical scar noted. Pain with palpation right upper quadrant, moderate in nature. No rebound or guarding  Genitourinary: Rectum normal. Guaiac negative stool.  Musculoskeletal: Normal range of motion. He exhibits no edema and no tenderness.  Lymphadenopathy:    He has no cervical adenopathy.  Neurological: He is oriented  to person, place, and time. He has normal reflexes. He exhibits normal muscle tone. Coordination normal.  Skin: Skin is dry. No rash noted. No erythema. No pallor.  Psychiatric: He has a normal mood and affect. His behavior is normal. Judgment and thought content normal.    ED Course  Procedures (including critical care time)  Labs Reviewed  CBC - Abnormal; Notable for the following:    WBC 10.7 (*)    RBC 1.58 (*)    Hemoglobin 5.6 (*)    HCT 17.2 (*)    MCV 108.9 (*)    MCH 35.4 (*)    RDW 32.2 (*)    All other components within normal limits  DIFFERENTIAL - Abnormal; Notable for the following:    Neutro Abs 8.0 (*)    All other components within normal limits  COMPREHENSIVE METABOLIC PANEL - Abnormal; Notable for the following:    Potassium 3.2 (*)    Glucose, Bld 147 (*)    Creatinine, Ser 1.38 (*)    Albumin 3.2 (*)    Total Bilirubin 5.2 (*)    GFR calc non Af Amer 49 (*)    GFR calc Af Amer 57 (*)    All other components within normal limits  PROTIME-INR - Abnormal; Notable for the following:    Prothrombin Time 15.5 (*)    All other components within normal limits  TYPE AND SCREEN  PREPARE RBC (CROSSMATCH)  OCCULT BLOOD, POC DEVICE  URINALYSIS, ROUTINE W REFLEX MICROSCOPIC  POCT OCCULT BLOOD STOOL, DEVICE   Dg Ribs Unilateral W/chest Right  06/08/2011  *RADIOLOGY REPORT*  Clinical Data: Cough, congestion, near syncope, right-sided chest and abdomen pain  RIGHT RIBS AND CHEST - 3+ VIEW  Comparison: 05/06/2011  Findings: Shallow inspiration.  Emphysematous changes in the lungs. Scattered fibrosis.  No focal airspace consolidation.  Normal heart size and pulmonary vascularity.  Tortuous and ectatic aorta.  No pneumothorax.  Right ribs appear intact without displaced fracture.  IMPRESSION: Emphysematous changes and fibrosis in the lungs.  No evidence of active pulmonary disease.  No displaced right rib fractures.  Original Report Authenticated By: Marlon Pel, M.D.    Ct Angio Chest W/cm &/or Wo Cm  06/08/2011  *RADIOLOGY REPORT*  Clinical Data:  New onset abdominal pain, back pain, and rib cage pain.  CT ANGIOGRAPHY CHEST, ABDOMEN AND PELVIS  Technique:  Multidetector CT imaging through the chest and abdomen was performed using the standard protocol during bolus administration of intravenous contrast.  Multiplanar reconstructed images including MIPs were obtained and reviewed to evaluate the vascular anatomy.  Contrast: OMNIPAQUE IOHEXOL 350 MG/ML IV SOLN  Comparison:  CT chest 04/14/2011  CTA CHEST  Findings:  Unenhanced CT images of the chest and abdomen demonstrate thoracic aortic ectasia with calcification.  Coronary artery calcification.  No evidence of intramural thrombus.  Tortuous and ectatic thoracic aorta.  Maximal AP diameter is about 3.5 cm in the ascending aorta.  No evidence of dissection.  No abnormal mediastinal or pleural fluid collections.  No significant lymphadenopathy in the chest.  No pleural effusion.  A filling defect is demonstrated and anterior right upper lung pulmonary artery suggesting subsegmental pulmonary embolus.  No other pulmonary arterial filling defects are demonstrated.  Emphysematous changes and interstitial fibrosis in the lungs.  Atelectasis in the lung bases.  No pneumothorax.  Nodules seen previously in the superior segment of the left lower lung is not visualized today and may have been inflammatory. Enlarged nodular thyroid gland as previously demonstrated.   Review of the MIP images confirms the above findings.  IMPRESSION: Ectatic thoracic aorta without aneurysm or dissection.  Filling defect in a right upper lung pulmonary arteries suggesting subsegmental pulmonary embolus.  Emphysema and fibrosis in the lungs.  CTA ABDOMEN  Findings:  The study is abdominal and extends to the upper aspect of the iliac crest.  This does not include the entire aneurysm repair.  Distal aneurysm sac and repair remains indeterminate as it  is not visualized.  There is an infrarenal abdominal aortic aneurysm post repair. Calcification of the native sac with thrombosis of the native sac and patency of the graft.  No retroperitoneal fluid collection or contrast extravasation.  Atherosclerotic changes in the upper abdominal aorta.  Vascular calcifications in the branch vessels. Superior mesenteric artery, celiac axis, and renal artery origins appear patent.  The inferior mesenteric artery is not demonstrated.Calcified granulomas in the spleen.  The liver parenchyma is homogeneous.  Cholelithiasis.  No bile duct dilatation.  The pancreas and adrenal glands are unremarkable. Suggestion of small stones versus vascular calcifications throughout the kidneys.  No evidence of obstruction.  Cyst in the right kidney measures about 3.3 cm diameter.  Visualized stomach, small bowel, and large bowel are decompressed.  No free air or free fluid in the abdomen.  Degenerative changes in the thoracic and lumbar spine.   Review of the MIP images confirms the above findings.  IMPRESSION: Postoperative change with repair of abdominal aortic aneurysm. This is not completely included on the study.  No retroperitoneal fluid collection or contrast extravasation demonstrated in the visualized abdomen.  No free air or free fluid in the abdomen. Cholelithiasis.  Results discussed with Dr. Norlene Campbell at the time of dictation, [phone] on 06/08/2011.  Original Report Authenticated By: Marlon Pel, M.D.   Ct Angio Abdomen W/cm &/or Wo Contrast  06/08/2011  *RADIOLOGY REPORT*  Clinical Data:  New onset abdominal pain, back pain, and rib cage pain.  CT ANGIOGRAPHY CHEST, ABDOMEN AND PELVIS  Technique:  Multidetector CT imaging through the chest and abdomen was performed using the standard protocol during bolus administration of intravenous contrast.  Multiplanar reconstructed images including MIPs were obtained and reviewed to evaluate the vascular anatomy.  Contrast:  OMNIPAQUE IOHEXOL 350 MG/ML IV SOLN  Comparison:  CT chest 04/14/2011  CTA CHEST  Findings:  Unenhanced CT images of the chest and abdomen demonstrate thoracic aortic ectasia with calcification.  Coronary artery calcification.  No evidence of intramural thrombus.  Tortuous and ectatic thoracic aorta.  Maximal AP diameter is about 3.5 cm in the ascending aorta.  No evidence of dissection.  No abnormal mediastinal or pleural fluid collections.  No  significant lymphadenopathy in the chest.  No pleural effusion.  A filling defect is demonstrated and anterior right upper lung pulmonary artery suggesting subsegmental pulmonary embolus.  No other pulmonary arterial filling defects are demonstrated.  Emphysematous changes and interstitial fibrosis in the lungs.  Atelectasis in the lung bases.  No pneumothorax.  Nodules seen previously in the superior segment of the left lower lung is not visualized today and may have been inflammatory. Enlarged nodular thyroid gland as previously demonstrated.   Review of the MIP images confirms the above findings.  IMPRESSION: Ectatic thoracic aorta without aneurysm or dissection.  Filling defect in a right upper lung pulmonary arteries suggesting subsegmental pulmonary embolus.  Emphysema and fibrosis in the lungs.  CTA ABDOMEN  Findings:  The study is abdominal and extends to the upper aspect of the iliac crest.  This does not include the entire aneurysm repair.  Distal aneurysm sac and repair remains indeterminate as it is not visualized.  There is an infrarenal abdominal aortic aneurysm post repair. Calcification of the native sac with thrombosis of the native sac and patency of the graft.  No retroperitoneal fluid collection or contrast extravasation.  Atherosclerotic changes in the upper abdominal aorta.  Vascular calcifications in the branch vessels. Superior mesenteric artery, celiac axis, and renal artery origins appear patent.  The inferior mesenteric artery is not  demonstrated.Calcified granulomas in the spleen.  The liver parenchyma is homogeneous.  Cholelithiasis.  No bile duct dilatation.  The pancreas and adrenal glands are unremarkable. Suggestion of small stones versus vascular calcifications throughout the kidneys.  No evidence of obstruction.  Cyst in the right kidney measures about 3.3 cm diameter.  Visualized stomach, small bowel, and large bowel are decompressed.  No free air or free fluid in the abdomen.  Degenerative changes in the thoracic and lumbar spine.   Review of the MIP images confirms the above findings.  IMPRESSION: Postoperative change with repair of abdominal aortic aneurysm. This is not completely included on the study.  No retroperitoneal fluid collection or contrast extravasation demonstrated in the visualized abdomen.  No free air or free fluid in the abdomen. Cholelithiasis.  Results discussed with Dr. Norlene Campbell at the time of dictation, [phone] hours on 06/08/2011.  Original Report Authenticated By: Marlon Pel, M.D.     1. Anemia   2. Pulmonary embolus   3. Weakness       MDM  76 year old male who presents with symptomatic anemia with acute drop in H&H from most recent lab draw in the oncology office less than one month ago. Unable to find source of bleeding. CT angiogram of chest abdomen and pelvis ordered given concern over acute anemia and recent AAA repair, but this shows no blood in the abdomen, minor possible PE. I am not anticoagulating patient at this time given his acute drop in H&H as I do not feel it is safe to anticoagulate without first finding out the source of his loss of blood. Patient and family updated on findings and plan. Hospitalist to admit. blood transfusion has been ordered        Olivia Mackie, MD 06/08/11 [phone]

## 2011-06-08 NOTE — ED Notes (Signed)
MD at bedside. 

## 2011-06-08 NOTE — Progress Notes (Signed)
CC: [name]    HPI: 76 year-old [place] man followed by my partner Dr Jama Flavors for a history of iron deficiency anemia, AAA s/p repair; now admitted with a Hb of <5 and evidence of pulmonary embolism.  Past medical history:    Past Medical History  Diagnosis Date  . AAA (abdominal aortic aneurysm)   . Anemia   . Thrombocytopenia   . Iron deficiency anemia 05/26/2011  . Abdominal aneurysm without mention of rupture 04/23/2011  . Peripheral arterial disease 04/23/2011  . COPD (chronic obstructive pulmonary disease) 05/06/2011    Past surgical history:     Past Surgical History  Procedure Date  . Abdominal aortic aneurysm repair   . Pr vein bypass graft,aorto-fem-pop     Family history:    Family History  Problem Relation Age of Onset  . Cancer Mother     breast  . Hypertension Father   . Diabetes Sister   . COPD Brother       Allergies: No Known Allergies  No current facility-administered medications for this visit. Current outpatient prescriptions:albuterol (PROVENTIL HFA;VENTOLIN HFA) 108 (90 BASE) MCG/ACT inhaler, Inhale 1-2 puffs into the lungs every 4 (four) hours as needed.  , Disp: , Rfl: ;  dextromethorphan-guaiFENesin (MUCINEX DM) 30-600 MG per 12 hr tablet, Take 1-2 tablets by mouth every 12 (twelve) hours.  , Disp: , Rfl: ;  digoxin (LANOXIN) 0.125 MG tablet, Take 125 mcg by mouth daily.  , Disp: , Rfl:  FeFum-FePoly-FA-B Cmp-C-Biot (INTEGRA PLUS) CAPS, Take 1 capsule by mouth daily.  , Disp: , Rfl: ;  ferrous sulfate 325 (65 FE) MG tablet, Take 325 mg by mouth daily with breakfast.  , Disp: , Rfl: ;  Metoprolol Tartrate (LOPRESSOR PO), Take 25 mg by mouth daily. , Disp: , Rfl: ;  Multiple Vitamin (MULTIVITAMIN) capsule, Take 1 capsule by mouth daily.  , Disp: , Rfl: ;  Nutritional Supplements (ENSURE PO), Take by mouth daily.  , Disp: , Rfl:  Pantoprazole Sodium (PROTONIX PO), Take 40 mg by mouth 2 (two) times daily. , Disp: , Rfl:  Facility-Administered  Medications Ordered in Other Visits: diphenhydrAMINE (BENADRYL) injection 25 mg, 25 mg, Intravenous, Q6H PRN, Leroy Sea, MD;  furosemide (LASIX) injection 10 mg, 10 mg, Intravenous, Once, Leroy Sea, MD;  iohexol (OMNIPAQUE) 350 MG/ML injection 100 mL, 100 mL, Intravenous, Once PRN, Medication Radiologist, 100 mL at 06/08/11 0509 pantoprazole (PROTONIX) 80 mg in sodium chloride 0.9 % 100 mL IVPB, 80 mg, Intravenous, Once, Leroy Sea, MD, 80 mg at 06/08/11 1335;  pantoprazole (PROTONIX) 80 mg in sodium chloride 0.9 % 250 mL infusion, 8 mg/hr, Intravenous, Continuous, Leroy Sea, MD, Last Rate: 25 mL/hr at 06/08/11 1406, 8 mg/hr at 06/08/11 1406 potassium chloride SA (K-DUR,KLOR-CON) CR tablet 40 mEq, 40 mEq, Oral, Once, Leroy Sea, MD, 40 mEq at 06/08/11 0840;  sodium chloride 0.9 % bolus 500 mL, 500 mL, Intravenous, Once, Leroy Sea, MD, 500 mL at 06/08/11 0902  ROS patient undergoing procedures, unable to interview  Physical Exam: pt. undergoing procedures, unable to examine  There were no vitals taken for this visit.   LABS      Component Value Date/Time   WBC 10.7* 06/08/2011 0321   WBC 9.7 05/26/2011 0851   RBC 1.58* 06/08/2011 0321   RBC 3.75* 05/26/2011 0851   HGB 4.8* 06/08/2011 1157   HGB 10.3* 05/26/2011 0851   HCT 15.1* 06/08/2011 1157   HCT 30.7* 05/26/2011  0851   PLT 188 06/08/2011 0321   PLT 140 05/26/2011 0851   MCV 108.9* 06/08/2011 0321   MCV 81.9 05/26/2011 0851   MCH 35.4* 06/08/2011 0321   MCH 27.5 05/26/2011 0851   MCHC 32.6 06/08/2011 0321   MCHC 33.6 05/26/2011 0851   RDW 32.2* 06/08/2011 0321   RDW 16.3* 05/26/2011 0851   LYMPHSABS 1.9 06/08/2011 0321   LYMPHSABS 3.5* 05/26/2011 0851   MONOABS 0.8 06/08/2011 0321   MONOABS 1.0* 05/26/2011 0851   EOSABS 0.0 06/08/2011 0321   EOSABS 0.1 05/26/2011 0851   BASOSABS 0.0 06/08/2011 0321   BASOSABS 0.1 05/26/2011 0851      Chemistry      Component Value Date/Time   NA  137 06/08/2011 0321   K 3.2* 06/08/2011 0321   CL 100 06/08/2011 0321   CO2 19 06/08/2011 0321   BUN 21 06/08/2011 0321   CREATININE 1.38* 06/08/2011 0321      Component Value Date/Time   CALCIUM 9.5 06/08/2011 0321   ALKPHOS 98 06/08/2011 0321   AST 30 06/08/2011 0321   ALT 12 06/08/2011 0321   BILITOT 5.2* 06/08/2011 0321        Studies:   Ct Abdomen Pelvis Wo Contrast  06/08/2011  *RADIOLOGY REPORT*  Clinical Data: Anemia.  History of abdominal aortic aneurysm. COPD.  Right lower quadrant pain.  CT ABDOMEN AND PELVIS WITHOUT CONTRAST  Technique:  Multidetector CT imaging of the abdomen and pelvis was performed following the standard protocol without intravenous contrast.  Comparison: Earlier today CTA of the abdomen at 0500 hours.Abdominal pelvic CT of 03/10/2011.  Findings: Scarring at the right lung base anteriorly.  Developing airspace disease at the right lung base.  Mild scarring at the left lung base.  Cardiomegaly without pericardial or pleural effusion.  Exam degraded by lack of IV contrast.  There is contrast within the renal collecting systems and bladder from the CTA of earlier in the day.  Normal liver, spleen, stomach, pancreas.  Gallstones without acute cholecystitis or biliary ductal dilatation.  Normal adrenal glands. Right greater than left bilateral renal cysts.  Repair of abdominal aortic aneurysm.  Better evaluated on earlier contrast enhanced study.  No surrounding hemorrhage or retroperitoneal adenopathy.  Sigmoid diverticulosis with probable muscular hypertrophy.  Normal terminal ileum. Appendix not visualized.  Normal caliber of small bowel loops.  No pneumatosis, free intraperitoneal air, or ascites.  No pelvic adenopathy.  Normal appearance of the anastomotic sites within the groins bilaterally.  No evidence of complicating fluid collection or surrounding hemorrhage.  Urinary bladder is mildly distended.  Moderate prostatomegaly. No significant free fluid.  Mild  osteopenia.  Degenerative partial fusion of the bilateral sacroiliac joints.  Lower lumbar spondylosis.  IMPRESSION:  1.  No acute findings within the pelvis. 2.  No interval change within the abdomen since the CTA of the abdomen of 4 hours ago. 3.  Cholelithiasis.  4.  Degraded exam secondary lack of IV contrast. 5.  Suspect developing right base infection or aspiration.  Original Report Authenticated By: Consuello Bossier, M.D.   Dg Ribs Unilateral W/chest Right  06/08/2011  *RADIOLOGY REPORT*  Clinical Data: Cough, congestion, near syncope, right-sided chest and abdomen pain  RIGHT RIBS AND CHEST - 3+ VIEW  Comparison: 05/06/2011  Findings: Shallow inspiration.  Emphysematous changes in the lungs. Scattered fibrosis.  No focal airspace consolidation.  Normal heart size and pulmonary vascularity.  Tortuous and ectatic aorta.  No pneumothorax.  Right ribs appear intact without  displaced fracture.  IMPRESSION: Emphysematous changes and fibrosis in the lungs.  No evidence of active pulmonary disease.  No displaced right rib fractures.  Original Report Authenticated By: Marlon Pel, M.D.   Ct Angio Chest W/cm &/or Wo Cm  06/08/2011  *RADIOLOGY REPORT*  Clinical Data:  New onset abdominal pain, back pain, and rib cage pain.  CT ANGIOGRAPHY CHEST, ABDOMEN AND PELVIS  Technique:  Multidetector CT imaging through the chest and abdomen was performed using the standard protocol during bolus administration of intravenous contrast.  Multiplanar reconstructed images including MIPs were obtained and reviewed to evaluate the vascular anatomy.  Contrast: OMNIPAQUE IOHEXOL 350 MG/ML IV SOLN  Comparison:  CT chest 04/14/2011  CTA CHEST  Findings:  Unenhanced CT images of the chest and abdomen demonstrate thoracic aortic ectasia with calcification.  Coronary artery calcification.  No evidence of intramural thrombus.  Tortuous and ectatic thoracic aorta.  Maximal AP diameter is about 3.5 cm in the ascending aorta.   No evidence of dissection.  No abnormal mediastinal or pleural fluid collections.  No significant lymphadenopathy in the chest.  No pleural effusion.  A filling defect is demonstrated and anterior right upper lung pulmonary artery suggesting subsegmental pulmonary embolus.  No other pulmonary arterial filling defects are demonstrated.  Emphysematous changes and interstitial fibrosis in the lungs.  Atelectasis in the lung bases.  No pneumothorax.  Nodules seen previously in the superior segment of the left lower lung is not visualized today and may have been inflammatory. Enlarged nodular thyroid gland as previously demonstrated.   Review of the MIP images confirms the above findings.  IMPRESSION: Ectatic thoracic aorta without aneurysm or dissection.  Filling defect in a right upper lung pulmonary arteries suggesting subsegmental pulmonary embolus.  Emphysema and fibrosis in the lungs.  CTA ABDOMEN  Findings:  The study is abdominal and extends to the upper aspect of the iliac crest.  This does not include the entire aneurysm repair.  Distal aneurysm sac and repair remains indeterminate as it is not visualized.  There is an infrarenal abdominal aortic aneurysm post repair. Calcification of the native sac with thrombosis of the native sac and patency of the graft.  No retroperitoneal fluid collection or contrast extravasation.  Atherosclerotic changes in the upper abdominal aorta.  Vascular calcifications in the branch vessels. Superior mesenteric artery, celiac axis, and renal artery origins appear patent.  The inferior mesenteric artery is not demonstrated.Calcified granulomas in the spleen.  The liver parenchyma is homogeneous.  Cholelithiasis.  No bile duct dilatation.  The pancreas and adrenal glands are unremarkable. Suggestion of small stones versus vascular calcifications throughout the kidneys.  No evidence of obstruction.  Cyst in the right kidney measures about 3.3 cm diameter.  Visualized stomach, small  bowel, and large bowel are decompressed.  No free air or free fluid in the abdomen.  Degenerative changes in the thoracic and lumbar spine.   Review of the MIP images confirms the above findings.  IMPRESSION: Postoperative change with repair of abdominal aortic aneurysm. This is not completely included on the study.  No retroperitoneal fluid collection or contrast extravasation demonstrated in the visualized abdomen.  No free air or free fluid in the abdomen. Cholelithiasis.  Results discussed with Dr. Norlene Campbell at the time of dictation, [phone] hours on 06/08/2011.  Original Report Authenticated By: Marlon Pel, M.D.   Ct Angio Abdomen W/cm &/or Wo Contrast  06/08/2011  *RADIOLOGY REPORT*  Clinical Data:  New onset abdominal pain, back pain,  and rib cage pain.  CT ANGIOGRAPHY CHEST, ABDOMEN AND PELVIS  Technique:  Multidetector CT imaging through the chest and abdomen was performed using the standard protocol during bolus administration of intravenous contrast.  Multiplanar reconstructed images including MIPs were obtained and reviewed to evaluate the vascular anatomy.  Contrast: OMNIPAQUE IOHEXOL 350 MG/ML IV SOLN  Comparison:  CT chest 04/14/2011  CTA CHEST  Findings:  Unenhanced CT images of the chest and abdomen demonstrate thoracic aortic ectasia with calcification.  Coronary artery calcification.  No evidence of intramural thrombus.  Tortuous and ectatic thoracic aorta.  Maximal AP diameter is about 3.5 cm in the ascending aorta.  No evidence of dissection.  No abnormal mediastinal or pleural fluid collections.  No significant lymphadenopathy in the chest.  No pleural effusion.  A filling defect is demonstrated and anterior right upper lung pulmonary artery suggesting subsegmental pulmonary embolus.  No other pulmonary arterial filling defects are demonstrated.  Emphysematous changes and interstitial fibrosis in the lungs.  Atelectasis in the lung bases.  No pneumothorax.  Nodules seen previously in  the superior segment of the left lower lung is not visualized today and may have been inflammatory. Enlarged nodular thyroid gland as previously demonstrated.   Review of the MIP images confirms the above findings.  IMPRESSION: Ectatic thoracic aorta without aneurysm or dissection.  Filling defect in a right upper lung pulmonary arteries suggesting subsegmental pulmonary embolus.  Emphysema and fibrosis in the lungs.  CTA ABDOMEN  Findings:  The study is abdominal and extends to the upper aspect of the iliac crest.  This does not include the entire aneurysm repair.  Distal aneurysm sac and repair remains indeterminate as it is not visualized.  There is an infrarenal abdominal aortic aneurysm post repair. Calcification of the native sac with thrombosis of the native sac and patency of the graft.  No retroperitoneal fluid collection or contrast extravasation.  Atherosclerotic changes in the upper abdominal aorta.  Vascular calcifications in the branch vessels. Superior mesenteric artery, celiac axis, and renal artery origins appear patent.  The inferior mesenteric artery is not demonstrated.Calcified granulomas in the spleen.  The liver parenchyma is homogeneous.  Cholelithiasis.  No bile duct dilatation.  The pancreas and adrenal glands are unremarkable. Suggestion of small stones versus vascular calcifications throughout the kidneys.  No evidence of obstruction.  Cyst in the right kidney measures about 3.3 cm diameter.  Visualized stomach, small bowel, and large bowel are decompressed.  No free air or free fluid in the abdomen.  Degenerative changes in the thoracic and lumbar spine.   Review of the MIP images confirms the above findings.  IMPRESSION: Postoperative change with repair of abdominal aortic aneurysm. This is not completely included on the study.  No retroperitoneal fluid collection or contrast extravasation demonstrated in the visualized abdomen.  No free air or free fluid in the abdomen. Cholelithiasis.   Results discussed with Dr. Norlene Campbell at the time of dictation, [phone] hours on 06/08/2011.  Original Report Authenticated By: Marlon Pel, M.D.     Assessment: I have reviewed the patient's blood film, which shows rare schistocytes (less than 1 per HPF). This argues against TTP. It also argues against hemolysis due to shearing (from problems with the AAA repair). The normal WBC and platelets does not suggest a primary bone marrow pathology.  Instead, the patient is Coombs positive, has an elevated bilirubin and a very elevated reticulocyte count (the blood film shows polychromasia and nucleated red blood cells, consistent with  a stress marrow). I have added an LDH and haptoglobin. I would suggest starting prednisone ay 1 mg/kg or equivalent IV steroids--have not written the order because I have not been able to examine the patient.  i have added Mr Cambre to my partner Dr Precious Reel list so she can follow-with you. Please let me know if I can be of further help today.      This plan is concordant with NCCN guidelines.   MAGRINAT,GUSTAV C 06/08/2011, 4:12 PM

## 2011-06-08 NOTE — Consult Note (Signed)
Name: [name] MRN: [id number] DOB: Nov 13, 1934    LOS: 0 PCCM ADMISSION NOTE  Consulting MD Thedore Mins  Reason for consultation: Pulmonary emboli in setting of profound anemia  History of Present Illness:   76 y.o. male, who had a AAA repair by Dr [name] on 10/2. Presents to ER on 12/11 with 3 day H/O Rt sided Pl Chest pain which is sharp constant, non radiaiting, + with deep breaths, better with rest and mild SOB. In ER was found to have a small R.sided PE, Hb 5.6 drop of 5 units from 1 mths ago, Pulm asked to evaluate re: placement of IVC filter vs anticoagulation in setting of profound anemia.    Lines / Drains:   Cultures:   Antibiotics:  Tests / Events:  HPI 76 y.o. male, who had a AAA repair by Dr [name] on 10/2. Presents to ER on 12/11 with 3 day H/O Rt sided Pl Chest pain which is sharp constant, non radiaiting, + with deep breaths, better with rest and mild SOB. In ER was found to have a small R.sided PE, Hb 5.6 drop of 5 units from 1 mths ago, Pulm asked to evaluate re: placement of IVC filter vs anticoagulation in setting of profound anemia. Recently seen at heme/onc office on 11/28 for follow up of anemia without clear cause, also treated for purulent bronchitis at that time with follow up appointment in our office on 12/3 stating congestion had improved. On CCM eval pt reports his shortness of breath has worsened specifically over the last 3 days and does indeed report the right sided pleuritic type chest pain. Has had no recent travel or lower extremity swelling. In regards to the anemia, he denies recent trauma, Dr [name] was at bedside and was satisfied that the AAA repair was not responsible for the anemia. On further questioning he does report that although his bowel regimen is perfectly regular he has noted his stool to be darker over the last 3-5 days as well as change in the odor. He has also had episodes of light headedness over the last few days prior to presentation.     Past Medical History  Diagnosis Date  . AAA (abdominal aortic aneurysm)   . Anemia: Sept 2012 when he presented with an apparent spontaneous bleed into RUE; during all of that evaluation he was also found to have a significant AAA and iliac aneurysm. He had no splenomegaly or cirrhotic liver findings, no adenopathy, fecal occult blood was negative, no evidence of HIT, ITP, coagulopathy, no myeloma, did have moderate blood in a urinalysis. He has not had colonoscopy or urology evaluation. (per [name]'s note on 11/29)    . Thrombocytopenia   . Iron deficiency anemia 05/26/2011  . Abdominal aneurysm without mention of rupture 04/23/2011  . Peripheral arterial disease 04/23/2011  . COPD (chronic obstructive pulmonary disease) 05/06/2011   Past Surgical History  Procedure Date  . Abdominal aortic aneurysm repair   . Pr vein bypass graft,aorto-fem-pop    Prior to Admission medications   Medication Sig Start Date End Date Taking? Authorizing Provider  albuterol (PROVENTIL HFA;VENTOLIN HFA) 108 (90 BASE) MCG/ACT inhaler Inhale 1-2 puffs into the lungs every 4 (four) hours as needed.   05/11/11  Yes Nilda Simmer, MD  dextromethorphan-guaiFENesin [facility] DM) 30-600 MG per 12 hr tablet Take 1-2 tablets by mouth every 12 (twelve) hours.     Yes Historical Provider, MD  digoxin (LANOXIN) 0.125 MG tablet Take 125 mcg by mouth daily.  Yes Historical Provider, MD  FeFum-FePoly-FA-B Cmp-C-Biot (INTEGRA PLUS) CAPS Take 1 capsule by mouth daily.     Yes Historical Provider, MD  ferrous sulfate 325 (65 FE) MG tablet Take 325 mg by mouth daily with breakfast.     Yes Historical Provider, MD  Metoprolol Tartrate (LOPRESSOR PO) Take 25 mg by mouth daily.    Yes Historical Provider, MD  Multiple Vitamin (MULTIVITAMIN) capsule Take 1 capsule by mouth daily.     Yes Historical Provider, MD  Nutritional Supplements (ENSURE PO) Take by mouth daily.     Yes Historical Provider, MD  Pantoprazole Sodium  (PROTONIX PO) Take 40 mg by mouth 2 (two) times daily.    Yes Historical Provider, MD   Allergies No Known Allergies  Family History Family History  Problem Relation Age of Onset  . Cancer Mother     breast  . Hypertension Father   . Diabetes Sister   . COPD Brother     Social History  reports that he quit smoking about 2 months ago. His smoking use included Cigarettes. He has a 25 pack-year smoking history. He has never used smokeless tobacco. He reports that he does not drink alcohol or use illicit drugs.  Review Of Systems   Review of Systems  Constitutional: No weight loss, gain, night sweats, no Fevers, chills, has had increased fatigue .  HEENT: No headaches, visual changes, Difficulty swallowing, Tooth/dental problems, or Sore throat,  No sneezing, itching, ear ache, nasal congestion, post nasal drip CV: No Orthopnea, PND, swelling in lower extremities, lower ext pain, has had dizziness, but denies palpitations, or syncope.  GI No heartburn, indigestion, has vague abdominal pain, no nausea, vomiting, diarrhea,  bowel regimen has been the same, but has noted stools have been darker, and more foul smelling, denies loss of appetite. Resp:cough cleared up since treated on 12/3, No coughing up of blood. Does report increased shortness of breath above baseline. Also has had right sided pleuritic chest pain.  Skin: no rash or itching or icterus GU: no dysuria, change in color of urine, no urgency or frequency. No flank pain, no hematuria  MS: No joint pain or swelling. No decreased range of motion  Psych: No change in mood or affect. No depression or anxiety.  Neuro: no difficulty with speech, weakness, numbness, ataxia    Vital Signs: Temp:  [98 F (36.7 C)-98.7 F (37.1 C)] 98.7 F (37.1 C) (12/11 0415) Pulse Rate:  [70-83] 75  (12/11 0715) Resp:  [18-27] 25  (12/11 0745) BP: (90-120)/(43-66) 118/65 mmHg (12/11 0745) SpO2:  [97 %-100 %] 100 % (12/11 0715)    Physical  Examination: General: frail 76 year old male in no acute distress.  Neuro:  Alert and oriented.  HEENT: sclera are pale, as are mucous membranes.  Cardiovascular:  RRR Lungs: scattered rhonchi Abdomen:  Soft, non-tender Musculoskeletal:  No swelling, brisk CR Skin:  intact     Labs and Imaging:    Lab 06/08/11 0321  NA 137  K 3.2*  CL 100  CO2 19  BUN 21  CREATININE 1.38*  GLUCOSE 147*    Lab 06/08/11 0813 06/08/11 0321  HGB 4.6* 5.6*  HCT 14.5* 17.2*  WBC -- 10.7*  PLT -- 188    Assessment and Plan: Pulmonary embolism with resultant pleuritic type right sided chest pain. Rec: -retractable IVC filter, in the setting of profound anemia with out clear etiology -Lower extremity dopplers -no anticoagulation at this time  History of Peripheral  arterial disease s/p repair of  Abdominal aneurysm on 10/2. Vasc has seen. Does not thing AAA repair is related to anemia.  Rec: -per vasc surgery   COPD (chronic obstructive pulmonary disease) Plan: -PRN bds  Anemia. Has history of Iron deficiency anemia, but drop has been substantial. Was 10.8 on 10/13. According to his interview it does seem as though slow GI bleed could be added to diff dx.   Lab 06/08/11 0813 06/08/11 0321  HGB 4.6* 5.6*  plan: -agree with transfusion -Anemia panel, will check LDH, Haptoglobin,Schistiocytes,INR, ,Retic count - ordered by primary team -heme follow up requested by primary team who has discussed case.  -will ask GI to eval.     BABCOCK,PETE 06/08/2011, 9:45 AM   Attending note:  I have seen and examined Mr. Strollo with Mr. Tanja Port and agree with his note above.  He is currently complaining of mild left sided chest pain which started just a few minutes ago.  He is hemodynamically stable.    Given the unclear source of his anemia (bleeding vs. Bone marrow issue) I am uncomfortable anti-coagulating him.  We are reassured that Dr. [name] sees no evidence of bleeding around the recent AAA  repair.    We will place an order for a retrievable IVC filter.  I have explained the reasoning behind this to Mr. Broyles and his wife.    His new mild left sided chest pain likely represents a PE rather than coronary ischaemia.   We will order cardiac enzymes, pro-BNP, and a 2d TTE to evaluate for evidence of RV strain.  He needs an anemia work up with a hematology consult and gi consult.  We will follow.

## 2011-06-08 NOTE — H&P (Addendum)
[name] ZOX:[id number]  Outpatient Primary MD for the patient is No primary provider on file.  With History of -  Past Medical History  Diagnosis Date  . AAA (abdominal aortic aneurysm)   . Anemia   . Thrombocytopenia   . Iron deficiency anemia 05/26/2011  . Abdominal aneurysm without mention of rupture 04/23/2011  . Peripheral arterial disease 04/23/2011  . COPD (chronic obstructive pulmonary disease) 05/06/2011      Past Surgical History  Procedure Date  . Abdominal aortic aneurysm repair   . Pr vein bypass graft,aorto-fem-pop     in for   Chief Complaint  Patient presents with  . Abdominal Pain     HPI  [name]  is a 77 y.o. male, who had a AAA repair by Dr [name] 2 mths ago, comes in with 3 day H/O Rt sided Pl Chest pain which is sharp constant, non radiaiting, + with deep breaths, better with rest, mild SOB, denies any previous blood clot history, no recent travels, no leg swelling, no fever chills, no ABD pain as such, no black stools or blood in stool, in ER was found to have a small R.sided PE, Hb 5.6 drop of  5 units from 1 mths ago, Haem Occ -ve, CT ABD although not complete view but no signs of bleed.  Review of Systems    In addition to the HPI above,  No Fever-chills, No Headache, No changes with Vision or hearing, No problems swallowing food or Liquids, +  Chest pain, No Cough or Shortness of Breath, No Abdominal pain, No Nausea or Vommitting, Bowel movements are regular, No Blood in stool or Urine, No dysuria, No new skin rashes or bruises, No new joints pains-aches,  No new weakness, tingling, numbness in any extremity, No recent weight gain or loss, No polyuria, polydypsia or polyphagia, No significant Mental Stressors.  A full 10 point Review of Systems was done, except as stated above, all other Review of Systems were negative.   Social History History  Substance Use Topics  . Smoking status: Former Smoker -- 0.5 packs/day  for 50 years    Types: Cigarettes    Quit date: 03/30/2011  . Smokeless tobacco: Never Used  . Alcohol Use: No      Family History Family History  Problem Relation Age of Onset  . Cancer Mother     breast  . Hypertension Father   . Diabetes Sister   . COPD Brother       Prior to Admission medications   Medication Sig Start Date End Date Taking? Authorizing Provider  albuterol (PROVENTIL HFA;VENTOLIN HFA) 108 (90 BASE) MCG/ACT inhaler Inhale 1-2 puffs into the lungs every 4 (four) hours as needed.   05/11/11  Yes Nilda Simmer, MD  dextromethorphan-guaiFENesin [facility] DM) 30-600 MG per 12 hr tablet Take 1-2 tablets by mouth every 12 (twelve) hours.     Yes Historical Provider, MD  digoxin (LANOXIN) 0.125 MG tablet Take 125 mcg by mouth daily.     Yes Historical Provider, MD  FeFum-FePoly-FA-B Cmp-C-Biot (INTEGRA PLUS) CAPS Take 1 capsule by mouth daily.     Yes Historical Provider, MD  ferrous sulfate 325 (65 FE) MG tablet Take 325 mg by mouth daily with breakfast.     Yes Historical Provider, MD  Metoprolol Tartrate (LOPRESSOR PO) Take 25 mg by mouth daily.    Yes Historical Provider, MD  Multiple Vitamin (MULTIVITAMIN) capsule Take 1 capsule by mouth daily.     Yes  Historical Provider, MD  Nutritional Supplements (ENSURE PO) Take by mouth daily.     Yes Historical Provider, MD  Pantoprazole Sodium (PROTONIX PO) Take 40 mg by mouth 2 (two) times daily.    Yes Historical Provider, MD    No Known Allergies  Physical Exam No intake or output data in the 24 hours ending 06/08/11 0805 Blood pressure 118/65, pulse 75, temperature 98.7 F (37.1 C), temperature source Oral, resp. rate 25, SpO2 100.00%.  1. General frail AA Male lying in bed in NAD,     2. Normal affect and insight, Not Suicidal or Homicidal, Awake Alert, Oriented *3.  3. No F.N deficits, ALL C.Nerves Intact, Strength 5/5 all 4 extremities, Sensation intact all 4 extremities, Plantars down going.  4. Ears  and Eyes appear Normal, Conjunctivae clear, PERRLA. Moist Oral Mucosa.  5. Supple Neck, No JVD, No cervical lymphadenopathy appriciated, No Carotid Bruits.  6. Symmetrical Chest wall movement, Good air movement bilaterally, CTAB.  7. RRR, No Gallops, Rubs or Murmurs, No Parasternal Heave.  8. Positive Bowel Sounds, Abdomen Soft, Non tender, No organomegaly appriciated,       No rebound -guarding or rigidity.  9.  No Cyanosis, Normal Skin Turgor, No Skin Rash or Bruise.  10. Good muscle tone,  joints appear normal , no effusions, Normal ROM.  11. No Palpable Lymph Nodes in Neck or Axillae     Data Review  CBC  Lab 06/08/11 0321  WBC 10.7*  HGB 5.6*  HCT 17.2*  PLT 188  MCV 108.9*  MCH 35.4*  MCHC 32.6  RDW 32.2*  LYMPHSABS 1.9  MONOABS 0.8  EOSABS 0.0  BASOSABS 0.0  BANDABS --   ------------------------------------------------------------------------------------------------------------------ Chemistries   Lab 06/08/11 0321  NA 137  K 3.2*  CL 100  CO2 19  GLUCOSE 147*  BUN 21  CREATININE 1.38*  CALCIUM 9.5  MG --  AST 30  ALT 12  ALKPHOS 98  BILITOT 5.2*   ------------------------------------------------------------------------------------------------------------------ CrCl is unknown because both a height and weight (above a minimum accepted value) are required for this calculation. ------------------------------------------------------------------------------------------------------------------ No results found for this basename: TSH,T4TOTAL,FREET3,T3FREE,THYROIDAB in the last 72 hours  Coagulation profile  Lab 06/08/11 0455  INR 1.20  PROTIME --   ------------------------------------------------------------------------------------------------------------------- No results found for this basename: DDIMER:2 in the last 72  hours ------------------------------------------------------------------------------------------------------------------- Cardiac Enzymes No results found for this basename: CK:3,CKMB:3,TROPONINI:3,MYOGLOBIN:3 in the last 168 hours ------------------------------------------------------------------------------------------------------------------ No components found with this basename: POCBNP:3 ------------------------------------------------------------------------------------------------------------------  Imaging results:   Dg Ribs Unilateral W/chest Right  06/08/2011  *RADIOLOGY REPORT*  Clinical Data: Cough, congestion, near syncope, right-sided chest and abdomen pain  RIGHT RIBS AND CHEST - 3+ VIEW  Comparison: 05/06/2011  Findings: Shallow inspiration.  Emphysematous changes in the lungs. Scattered fibrosis.  No focal airspace consolidation.  Normal heart size and pulmonary vascularity.  Tortuous and ectatic aorta.  No pneumothorax.  Right ribs appear intact without displaced fracture.  IMPRESSION: Emphysematous changes and fibrosis in the lungs.  No evidence of active pulmonary disease.  No displaced right rib fractures.  Original Report Authenticated By: Marlon Pel, M.D.   Ct Angio Chest W/cm &/or Wo Cm  06/08/2011  *RADIOLOGY REPORT*  Clinical Data:  New onset abdominal pain, back pain, and rib cage pain.  CT ANGIOGRAPHY CHEST, ABDOMEN AND PELVIS  Technique:  Multidetector CT imaging through the chest and abdomen was performed using the standard protocol during bolus administration of intravenous contrast.  Multiplanar reconstructed images including MIPs were obtained  and reviewed to evaluate the vascular anatomy.  Contrast: OMNIPAQUE IOHEXOL 350 MG/ML IV SOLN  Comparison:  CT chest 04/14/2011  CTA CHEST  Findings:  Unenhanced CT images of the chest and abdomen demonstrate thoracic aortic ectasia with calcification.  Coronary artery calcification.  No evidence of intramural  thrombus.  Tortuous and ectatic thoracic aorta.  Maximal AP diameter is about 3.5 cm in the ascending aorta.  No evidence of dissection.  No abnormal mediastinal or pleural fluid collections.  No significant lymphadenopathy in the chest.  No pleural effusion.  A filling defect is demonstrated and anterior right upper lung pulmonary artery suggesting subsegmental pulmonary embolus.  No other pulmonary arterial filling defects are demonstrated.  Emphysematous changes and interstitial fibrosis in the lungs.  Atelectasis in the lung bases.  No pneumothorax.  Nodules seen previously in the superior segment of the left lower lung is not visualized today and may have been inflammatory. Enlarged nodular thyroid gland as previously demonstrated.   Review of the MIP images confirms the above findings.  IMPRESSION: Ectatic thoracic aorta without aneurysm or dissection.  Filling defect in a right upper lung pulmonary arteries suggesting subsegmental pulmonary embolus.  Emphysema and fibrosis in the lungs.  CTA ABDOMEN  Findings:  The study is abdominal and extends to the upper aspect of the iliac crest.  This does not include the entire aneurysm repair.  Distal aneurysm sac and repair remains indeterminate as it is not visualized.  There is an infrarenal abdominal aortic aneurysm post repair. Calcification of the native sac with thrombosis of the native sac and patency of the graft.  No retroperitoneal fluid collection or contrast extravasation.  Atherosclerotic changes in the upper abdominal aorta.  Vascular calcifications in the branch vessels. Superior mesenteric artery, celiac axis, and renal artery origins appear patent.  The inferior mesenteric artery is not demonstrated.Calcified granulomas in the spleen.  The liver parenchyma is homogeneous.  Cholelithiasis.  No bile duct dilatation.  The pancreas and adrenal glands are unremarkable. Suggestion of small stones versus vascular calcifications throughout the kidneys.  No  evidence of obstruction.  Cyst in the right kidney measures about 3.3 cm diameter.  Visualized stomach, small bowel, and large bowel are decompressed.  No free air or free fluid in the abdomen.  Degenerative changes in the thoracic and lumbar spine.   Review of the MIP images confirms the above findings.  IMPRESSION: Postoperative change with repair of abdominal aortic aneurysm. This is not completely included on the study.  No retroperitoneal fluid collection or contrast extravasation demonstrated in the visualized abdomen.  No free air or free fluid in the abdomen. Cholelithiasis.  Results discussed with Dr. Norlene Campbell at the time of dictation, [phone] hours on 06/08/2011.  Original Report Authenticated By: Marlon Pel, M.D.   Ct Angio Abdomen W/cm &/or Wo Contrast  06/08/2011  *RADIOLOGY REPORT*  Clinical Data:  New onset abdominal pain, back pain, and rib cage pain.  CT ANGIOGRAPHY CHEST, ABDOMEN AND PELVIS  Technique:  Multidetector CT imaging through the chest and abdomen was performed using the standard protocol during bolus administration of intravenous contrast.  Multiplanar reconstructed images including MIPs were obtained and reviewed to evaluate the vascular anatomy.  Contrast: OMNIPAQUE IOHEXOL 350 MG/ML IV SOLN  Comparison:  CT chest 04/14/2011  CTA CHEST  Findings:  Unenhanced CT images of the chest and abdomen demonstrate thoracic aortic ectasia with calcification.  Coronary artery calcification.  No evidence of intramural thrombus.  Tortuous and ectatic thoracic  aorta.  Maximal AP diameter is about 3.5 cm in the ascending aorta.  No evidence of dissection.  No abnormal mediastinal or pleural fluid collections.  No significant lymphadenopathy in the chest.  No pleural effusion.  A filling defect is demonstrated and anterior right upper lung pulmonary artery suggesting subsegmental pulmonary embolus.  No other pulmonary arterial filling defects are demonstrated.  Emphysematous changes and  interstitial fibrosis in the lungs.  Atelectasis in the lung bases.  No pneumothorax.  Nodules seen previously in the superior segment of the left lower lung is not visualized today and may have been inflammatory. Enlarged nodular thyroid gland as previously demonstrated.   Review of the MIP images confirms the above findings.  IMPRESSION: Ectatic thoracic aorta without aneurysm or dissection.  Filling defect in a right upper lung pulmonary arteries suggesting subsegmental pulmonary embolus.  Emphysema and fibrosis in the lungs.  CTA ABDOMEN  Findings:  The study is abdominal and extends to the upper aspect of the iliac crest.  This does not include the entire aneurysm repair.  Distal aneurysm sac and repair remains indeterminate as it is not visualized.  There is an infrarenal abdominal aortic aneurysm post repair. Calcification of the native sac with thrombosis of the native sac and patency of the graft.  No retroperitoneal fluid collection or contrast extravasation.  Atherosclerotic changes in the upper abdominal aorta.  Vascular calcifications in the branch vessels. Superior mesenteric artery, celiac axis, and renal artery origins appear patent.  The inferior mesenteric artery is not demonstrated.Calcified granulomas in the spleen.  The liver parenchyma is homogeneous.  Cholelithiasis.  No bile duct dilatation.  The pancreas and adrenal glands are unremarkable. Suggestion of small stones versus vascular calcifications throughout the kidneys.  No evidence of obstruction.  Cyst in the right kidney measures about 3.3 cm diameter.  Visualized stomach, small bowel, and large bowel are decompressed.  No free air or free fluid in the abdomen.  Degenerative changes in the thoracic and lumbar spine.   Review of the MIP images confirms the above findings.  IMPRESSION: Postoperative change with repair of abdominal aortic aneurysm. This is not completely included on the study.  No retroperitoneal fluid collection or  contrast extravasation demonstrated in the visualized abdomen.  No free air or free fluid in the abdomen. Cholelithiasis.  Results discussed with Dr. Norlene Campbell at the time of dictation, [phone] hours on 06/08/2011.  Original Report Authenticated By: Marlon Pel, M.D.   My personal review of EKG: Rhythm NSR, Rate  71 /min, QTc 461 , no Acute ST changes   Assessment & Plan  Principal Problem:  *Chest pain Active Problems:  Abdominal aneurysm without mention of rupture  Peripheral arterial disease  COPD (chronic obstructive pulmonary disease)  Iron deficiency anemia  Pulmonary embolism  1. Rt Sided Pl,Chest Pain - New PE, hemodynamically stable, with Acute drop in H&H - admit to step down, for now no anticoagulation, D/E Vas Surgery on Call for Dr [name] Dr Edilia Bo will see the pt from vas AAA standpoint, D/W PCCM they will see for ? Anticoagulation VS IVC filter, o2-Nebs PRN.Check Leg [place].  2. Acute drop in H&H - H/I Iron Def Anemia-Haem Occ -ve, CT Abd unremarkable in slightly limited view, Abd exam stable, will check Anemia panel, will check LDH, Haptoglobin,Schistiocytes,INR, ,Retic count - give 3 units PRBCs now and hold 2, H&H closely monitor, PO PPI.  D/W haem Dr Darnelle Catalan agrees with the plan and will see the pt soon, Continue Oral iron  supplement.  Addendum - post admission Hb 4.6 likely dilutional as he got 2 lits IVF, BP good, asymptomatic also D/W Radiology on call reviwed CT images - No Bleed.D/W PCCM although haem Occ -ve will place on IV PPI, also [facility] called for input.  3. H/O COPD - stable, PRN Nebs-o2.  4.Recent AAA repair - Vas to see soon, doubt Any bleed.  5.HX smoking - counseled, patch if needed.  6.Hypokalemia- replace-recheck in am.   DVT Prophylaxis  SCDs   AM Labs Ordered, also please review Full Orders.  Admission, patients condition and plan of care including tests being ordered have been discussed with the patient and wife who indicate understanding and  agree with the plan and Code Status.  Code Status Full  Condition GUARDED  Leroy Sea M.D 06/08/2011, 8:05 AM  [facility] Office  [phone]

## 2011-06-08 NOTE — ED Notes (Signed)
Lab called with critical hgb 4.6

## 2011-06-08 NOTE — ED Notes (Signed)
PT. REPORT MID ABDOMINAL AND RIGHT ANTERIOR RIBCAGE PAIN ONSET LAST Sunday , SLIGHT SOB , DENIES NAUSEA OR VOMITTING , NO DIARRHEA ,  DENIES FEVER / SLIGHT CHILLS.

## 2011-06-08 NOTE — ED Notes (Signed)
Critical hemoglobin of 5.6 reported to Dr. Norlene Campbell.

## 2011-06-08 NOTE — Consult Note (Signed)
Subjective: 76 year old AAM initially seen by Dr. Darrold Span for  iron deficiency anemia and previous thrombocytopenia on 11/28,diagnoses made Sept 2012 when he presented with an apparent spontaneous bleed into RUE who, during all of that evaluation he was also found to have a significant AAA and iliac aneurysm. He had no splenomegaly or cirrhotic liver findings, no adenopathy, fecal occult blood was negative, no evidence of HIT, ITP, coagulopathy, no myeloma, did have moderate blood in a urinalysis. He has not had colonoscopy or urology evaluation. He stopped cigarettes that admission, 40 year smoking history. Platelets ranged from ~ 85K to 120K and Hgb ~ 10 - 10.5. The LUE bleed resolved. 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 Last CBC in EMR 11/28  WBC 9.7, Hgb 10.3, Hct of 30.7 plt 140k. He has not continued oral iron as previously started.During our OP visit, he denied overt bleeding, fever, increased SOB, surgical pain, residual discomfort RUE. He had mild chest congestion and productive cough. Pt was admitted to ED today after  He developed 4 d history of abdominal pain and SOB in the setting of recent AAA. Repair. CT chest 12/11 w contrast today reveals R PE.  No dissection CT A/P negative for hemorage.  His H/H 4.6/14/5. WBC 10.7 and plts nl 188. Of note, his T bili is elevated at 5.2 with nl direct and indirect bili. Retic ct 42.6%, absolute 566. LDH is 344. Cr. 1.38. Smear shows marked polychromasia per tech review. Will evaluate for schistocytes. DAT negative.Hemocult negative. Current pending labs include Hapto, Retics, ESR, Fe studies and a new LDH We were informed of the patient's admission in the setting of abnormal labs, new R PE for which a filter is planned to be placed in the near future.    Past Medical History   Diagnosis   Date   .  AAA (abdominal aortic aneurysm)    .  Anemia    .  Thrombocytopenia    .  Iron deficiency anemia  05/26/2011   .  Abdominal aneurysm without mention of rupture  04/23/2011   .  Peripheral arterial disease  04/23/2011   .  COPD (chronic obstructive pulmonary disease)  05/06/2011    Past Surgical History   Procedure  Date   .  Abdominal aortic aneurysm repair    .  Pr vein bypass graft,aorto-fem-pop     Family History   Problem  Relation  Age of Onset   .  Cancer  Mother       breast    .  Hypertension  Father    .  Diabetes  Sister    .  COPD  Brother      Allergies: No Known Allergies   WUJ:WJXBJY of Systems  Constitutional: Positive for weight loss. Negative for fever, chills and malaise/fatigue.  Eyes: Negative for blurred vision and double vision.  Respiratory: Negative for cough, hemoptysis and shortness of breath. R sided CP Cardiovascular: Negative for chest pain.  GI: No nausea, vomiting, diarrhea, constipation. No change in bowel caliber. No Melena or Hematochezia. Positive for abdominal pain, and L flank pain.  GU: No blood in urine. No loss of urinary control.  Skin: Negative for itching. No rash. No petechia. No bruising  Neurological: No headaches. No motor or sensory deficits   Social History: reports that he quit smoking about  2 months ago. His smoking use included Cigarettes. He has a 25 pack-year smoking history. He has never used smokeless tobacco. He reports that he does not drink alcohol or use illicit drugs.   Objective: Vital signs in last 24 hours: BP 120/70  Pulse 70  Temp(Src) 98.1 F (36.7 C) (Oral)  Resp 18  SpO2 100% @WEIGHT @  Physical Exam: 76 y.o. thin AAM  in no acute distress  A. and O. X3.  HEENT: Sclera  Slightly icteric. Oral cavity without thrush or lesions. Neck supple.no cervical or supraclavicular adenopathy  Lungs: CTA. No wheezing, rhonchi , slight rales. No axillary masses. CV regular rate and rhythm normal  S1-S2, no murmur , rubs or gallops Abdomen soft nontender , bowel sounds x4  no hepatosplenomegaly, incision clean and dry. GU/rectal: deferred. Extremities: no clubbing cyanosis . No edema. No bruising or petechial rash. Skin mildly jaundiced. Neurologic: non focal     Labs:  CBC   Lab 06/08/11 0813 06/08/11 0321  WBC -- 10.7*  HGB 4.6* 5.6*  HCT 14.5* 17.2*  PLT -- 188  MCV -- 108.9*  MCH -- 35.4*  MCHC -- 32.6  RDW -- 32.2*  LYMPHSABS -- 1.9  MONOABS -- 0.8  EOSABS -- 0.0  BASOSABS -- 0.0  BANDABS -- --    CMP    Lab 06/08/11 0321  NA 137  K 3.2*  CL 100  CO2 19  GLUCOSE 147*  BUN 21  CREATININE 1.38*  CALCIUM 9.5  MG --  AST 30  ALT 12  ALKPHOS 98  BILITOT 5.2*        Component Value Date/Time   BILITOT 5.2* 06/08/2011 0321   BILIDIR 0.2 04/01/2011 0805   IBILI 0.4 04/01/2011 0805     Anemia panel:  Basename 06/08/11 0813  VITAMINB12 --  FOLATE --  FERRITIN --  TIBC --  IRON --  RETICCTPCT 42.6*    No results found for this basename: TSH,T4TOTAL,FREET3,T3FREE,THYROIDAB in the last 72 hours        Lab 06/08/11 0813 06/08/11 0455  INR 1.23 1.20  PROTIME -- --    No results found for this basename: DDIMER:2 in the last 72 hours  Imaging Studies:  RADIOLOGY REPORT*  Clinical Data: Anemia. History of abdominal aortic aneurysm.  COPD. Right lower quadrant pain.  CT ABDOMEN AND PELVIS WITHOUT CONTRAST  Technique: Multidetector CT imaging of the abdomen and pelvis was  performed following the standard protocol without intravenous  contrast.  Comparison: Earlier today CTA of the abdomen at 0500  hours.Abdominal pelvic CT of 03/10/2011.  Findings: Scarring at the right lung base anteriorly. Developing airspace disease at the right lung base. Mild scarring at the left  lung base. Cardiomegaly without pericardial or pleural effusion. Exam degraded by lack of IV contrast. There is contrast within the  renal collecting systems and  bladder from the CTA of earlier in the day. Normal liver, spleen, stomach, pancreas. Gallstones without acute cholecystitis or biliary ductal dilatation. Normal adrenal glands. Right greater than left bilateral renal cysts.  Repair of abdominal aortic aneurysm. Better evaluated on earlier contrast enhanced study. No surrounding hemorrhage or  retroperitoneal adenopathy. Sigmoid diverticulosis with probable muscular hypertrophy. Normal terminal ileum. Appendix not visualized. Normal caliber of small bowel loops. No pneumatosis, free intraperitoneal air, or ascites. No pelvic adenopathy. Normal appearance of the anastomotic sites within the groins bilaterally. No evidence of complicating fluid collection or surrounding hemorrhage. Urinary bladder is mildly distended. Moderate prostatomegaly. No significant free  fluid. Mild osteopenia. Degenerative partial fusion of the bilateral sacroiliac joints. Lower lumbar spondylosis. IMPRESSION:  1. No acute findings within the pelvis.  2. No interval change within the abdomen since the CTA of the abdomen of 4 hours ago.  3. Cholelithiasis.  4. Degraded exam secondary lack of IV contrast.  5. Suspect developing right base infection or aspiration.    CT ANGIOGRAPHY CHEST, ABDOMEN AND PELVIS  Technique: Multidetector CT imaging through the chest and abdomen was performed using the standard protocol during bolus  administration of intravenous contrast. Multiplanar reconstructed images including MIPs were obtained and reviewed to evaluate the vascular anatomy.  Contrast: OMNIPAQUE IOHEXOL 350 MG/ML IV SOLN  Comparison: CT chest 04/14/2011  CTA CHEST  Findings: Unenhanced CT images of the chest and abdomen demonstrate thoracic aortic ectasia with calcification. Coronary  artery calcification. No evidence of intramural thrombus. Tortuous and ectatic thoracic aorta. Maximal AP diameter is about  3.5 cm in the ascending aorta. No evidence of dissection. No  abnormal mediastinal or pleural fluid collections. No significant  lymphadenopathy in the chest. No pleural effusion. A filling defect is demonstrated and anterior right upper lung pulmonary  artery suggesting subsegmental pulmonary embolus. No other pulmonary arterial filling defects are demonstrated. Emphysematous changes and interstitial fibrosis in the lungs. Atelectasis in the lung bases. No pneumothorax. Nodules seen previously in the superior segment of the left lower lung is not visualized today and may have been inflammatory. Enlarged nodular thyroid gland as previously demonstrated. Review of the MIP images confirms the above findings. IMPRESSION:  Ectatic thoracic aorta without aneurysm or dissection. Filling defect in a right upper lung pulmonary arteries suggesting  subsegmental pulmonary embolus. Emphysema and fibrosis in the lungs.    Assessment/Plan:  76 y/o man with a history of  iron deficiency anemia and thrombocytopenia seen at our office  on 11/28 with,diagnoses made Sept 2012 when he presented with an apparent spontaneous bleed into RUE who, during all of that evaluation he was also found to have a significant AAA and iliac aneurysm undergoing repair, admitted to ED abdominal pain, SOB, found to have R PE as well as severe anemia with CTs negative for hemorrhage. We were requested to see patient with recommendations prior to scheduled filter placement.Marland Kitchen He is to receive RBC transfusion. Will evaluate smear. Will await for pertinent lab reports. Will follow CBC post transfusion. Dr Darrold Span is out of office today, and will resume follow up on this nice patient upon return.In the interim, Dr. Darnelle Catalan is to review the results once they become available. Thank you for informing us of the patient's admission.  LOS: 0 days   [facility] E 06/08/2011, 12:06 PM

## 2011-06-08 NOTE — ED Notes (Signed)
Attempted to get urine from Pt. Pt stated he did not have to go. Will try again shortly.

## 2011-06-08 NOTE — Procedures (Signed)
IVCgram neg. Infrarenal IVC retrievable filter placed. No complication No blood loss. See complete dictation in [facility].

## 2011-06-08 NOTE — ED Notes (Signed)
Dr. Thedore Mins notified of hgb 4.6. New verbal orders received for bolus, repeat CBC.

## 2011-06-08 NOTE — H&P (Signed)
[name] is an 76 y.o. male.   Chief Complaint: Right flank pain  HPI: 76 yo male presents to [facility] ED today for evaluation of right flank pain. Patient found to have a right lung pulmonary embolus. Hb. Noted to be in the 4 to 5 range. Retrievable IVC filter requested. The patient just finished lunch so procedure will need to be done with local anesthetic. Discussed with Dr. Deanne Coffer.  Past Medical History  Diagnosis Date  . AAA (abdominal aortic aneurysm)   . Anemia   . Thrombocytopenia   . Iron deficiency anemia 05/26/2011  . Abdominal aneurysm without mention of rupture 04/23/2011  . Peripheral arterial disease 04/23/2011  . COPD (chronic obstructive pulmonary disease) 05/06/2011    Past Surgical History  Procedure Date  . Abdominal aortic aneurysm repair   . Pr vein bypass graft,aorto-fem-pop     Family History  Problem Relation Age of Onset  . Cancer Mother     breast  . Hypertension Father   . Diabetes Sister   . COPD Brother    Social History:  reports that he quit smoking about 2 months ago. His smoking use included Cigarettes. He has a 25 pack-year smoking history. He has never used smokeless tobacco. He reports that he does not drink alcohol or use illicit drugs.  Allergies: No Known Allergies  Medications Prior to Admission  Medication Dose Route Frequency Provider Last Rate Last Dose  . diphenhydrAMINE (BENADRYL) injection 25 mg  25 mg Intravenous Q6H PRN Leroy Sea, MD      . furosemide (LASIX) injection 10 mg  10 mg Intravenous Once Leroy Sea, MD      . iohexol (OMNIPAQUE) 350 MG/ML injection 100 mL  100 mL Intravenous Once PRN Medication Radiologist   100 mL at 06/08/11 0509  . pantoprazole (PROTONIX) 80 mg in sodium chloride 0.9 % 100 mL IVPB  80 mg Intravenous Once Leroy Sea, MD      . pantoprazole (PROTONIX) 80 mg in sodium chloride 0.9 % 250 mL infusion  8 mg/hr Intravenous Continuous Leroy Sea, MD      . potassium  chloride SA (K-DUR,KLOR-CON) CR tablet 40 mEq  40 mEq Oral Once Leroy Sea, MD   40 mEq at 06/08/11 0840  . sodium chloride 0.9 % bolus 500 mL  500 mL Intravenous Once Leroy Sea, MD   500 mL at 06/08/11 0902   Medications Prior to Admission  Medication Sig Dispense Refill  . albuterol (PROVENTIL HFA;VENTOLIN HFA) 108 (90 BASE) MCG/ACT inhaler Inhale 1-2 puffs into the lungs every 4 (four) hours as needed.        Marland Kitchen dextromethorphan-guaiFENesin (MUCINEX DM) 30-600 MG per 12 hr tablet Take 1-2 tablets by mouth every 12 (twelve) hours.        . ferrous sulfate 325 (65 FE) MG tablet Take 325 mg by mouth daily with breakfast.        . Metoprolol Tartrate (LOPRESSOR PO) Take 25 mg by mouth daily.       . Multiple Vitamin (MULTIVITAMIN) capsule Take 1 capsule by mouth daily.        . Nutritional Supplements (ENSURE PO) Take by mouth daily.        . Pantoprazole Sodium (PROTONIX PO) Take 40 mg by mouth 2 (two) times daily.         Results for orders placed during the hospital encounter of 06/08/11 (from the past 48 hour(s))  CBC  Status: Abnormal   Collection Time   06/08/11  3:21 AM      Component Value Range Comment   WBC 10.7 (*) 4.0 - 10.5 (K/uL)    RBC 1.58 (*) 4.22 - 5.81 (MIL/uL)    Hemoglobin 5.6 (*) 13.0 - 17.0 (g/dL)    HCT 16.1 (*) 09.6 - 52.0 (%)    MCV 108.9 (*) 78.0 - 100.0 (fL)    MCH 35.4 (*) 26.0 - 34.0 (pg)    MCHC 32.6  30.0 - 36.0 (g/dL)    RDW 04.5 (*) 40.9 - 15.5 (%)    Platelets 188  150 - 400 (K/uL)   DIFFERENTIAL     Status: Abnormal   Collection Time   06/08/11  3:21 AM      Component Value Range Comment   Neutrophils Relative 75  43 - 77 (%)    Lymphocytes Relative 18  12 - 46 (%)    Monocytes Relative 7  3 - 12 (%)    Eosinophils Relative 0  0 - 5 (%)    Basophils Relative 0  0 - 1 (%)    Band Neutrophils 0  0 - 10 (%)    Metamyelocytes Relative 0      Myelocytes 0      Promyelocytes Absolute 0      Blasts 0      nRBC 0  0 (/100 WBC)     Neutro Abs 8.0 (*) 1.7 - 7.7 (K/uL)    Lymphs Abs 1.9  0.7 - 4.0 (K/uL)    Monocytes Absolute 0.8  0.1 - 1.0 (K/uL)    Eosinophils Absolute 0.0  0.0 - 0.7 (K/uL)    Basophils Absolute 0.0  0.0 - 0.1 (K/uL)    RBC Morphology MARKED POLYCHROMASIA   HOWELL/JOLLY BODIES  COMPREHENSIVE METABOLIC PANEL     Status: Abnormal   Collection Time   06/08/11  3:21 AM      Component Value Range Comment   Sodium 137  135 - 145 (mEq/L)    Potassium 3.2 (*) 3.5 - 5.1 (mEq/L)    Chloride 100  96 - 112 (mEq/L)    CO2 19  19 - 32 (mEq/L)    Glucose, Bld 147 (*) 70 - 99 (mg/dL)    BUN 21  6 - 23 (mg/dL)    Creatinine, Ser 8.11 (*) 0.50 - 1.35 (mg/dL)    Calcium 9.5  8.4 - 10.5 (mg/dL)    Total Protein 7.9  6.0 - 8.3 (g/dL)    Albumin 3.2 (*) 3.5 - 5.2 (g/dL)    AST 30  0 - 37 (U/L)    ALT 12  0 - 53 (U/L)    Alkaline Phosphatase 98  39 - 117 (U/L)    Total Bilirubin 5.2 (*) 0.3 - 1.2 (mg/dL)    GFR calc non Af Amer 49 (*) >90 (mL/min)    GFR calc Af Amer 57 (*) >90 (mL/min)   TYPE AND SCREEN     Status: Normal (Preliminary result)   Collection Time   06/08/11  4:50 AM      Component Value Range Comment   ABO/RH(D) A POS      Antibody Screen POS      Sample Expiration 06/11/2011      DAT, IgG POS      Antibody Identification PENDING      Antibody ID,T [place] PENDING      DAT, complement NEG     PREPARE RBC (CROSSMATCH)  Status: Normal   Collection Time   06/08/11  4:50 AM      Component Value Range Comment   Order Confirmation ORDER PROCESSED BY BLOOD BANK     PROTIME-INR     Status: Abnormal   Collection Time   06/08/11  4:55 AM      Component Value Range Comment   Prothrombin Time 15.5 (*) 11.6 - 15.2 (seconds)    INR 1.20  0.00 - 1.49    OCCULT BLOOD, POC DEVICE     Status: Normal   Collection Time   06/08/11  5:47 AM      Component Value Range Comment   Fecal Occult Bld NEGATIVE     RETICULOCYTES     Status: Abnormal   Collection Time   06/08/11  8:13 AM      Component Value  Range Comment   Retic Ct Pct 42.6 (*) 0.4 - 3.1 (%) RESULTS CONFIRMED BY MANUAL DILUTION   RBC. 1.33 (*) 4.22 - 5.81 (MIL/uL)    Retic Count, Manual 566.6 (*) 19.0 - 186.0 (K/uL)   LACTATE DEHYDROGENASE     Status: Abnormal   Collection Time   06/08/11  8:13 AM      Component Value Range Comment   LD 344 (*) 94 - 250 (U/L) HEMOLYSIS AT THIS LEVEL MAY AFFECT RESULT  HEMOGLOBIN AND HEMATOCRIT, BLOOD     Status: Abnormal   Collection Time   06/08/11  8:13 AM      Component Value Range Comment   Hemoglobin 4.6 (*) 13.0 - 17.0 (g/dL)    HCT 13.0 (*) 86.5 - 52.0 (%)   TECHNOLOGIST SMEAR REVIEW     Status: Normal   Collection Time   06/08/11  8:13 AM      Component Value Range Comment   Path Review MARKED POLYCHROMASIA     PROTIME-INR     Status: Abnormal   Collection Time   06/08/11  8:13 AM      Component Value Range Comment   Prothrombin Time 15.8 (*) 11.6 - 15.2 (seconds)    INR 1.23  0.00 - 1.49    URINALYSIS, ROUTINE W REFLEX MICROSCOPIC     Status: Abnormal   Collection Time   06/08/11 10:30 AM      Component Value Range Comment   Color, Urine YELLOW  YELLOW     APPearance CLEAR  CLEAR     Specific Gravity, Urine 1.010  1.005 - 1.030     pH 5.0  5.0 - 8.0     Glucose, UA NEGATIVE  NEGATIVE (mg/dL)    Hgb urine dipstick NEGATIVE  NEGATIVE     Bilirubin Urine NEGATIVE  NEGATIVE     Ketones, ur NEGATIVE  NEGATIVE (mg/dL)    Protein, ur NEGATIVE  NEGATIVE (mg/dL)    Urobilinogen, UA 2.0 (*) 0.0 - 1.0 (mg/dL)    Nitrite NEGATIVE  NEGATIVE     Leukocytes, UA NEGATIVE  NEGATIVE  MICROSCOPIC NOT DONE ON URINES WITH NEGATIVE PROTEIN, BLOOD, LEUKOCYTES, NITRITE, OR GLUCOSE <1000 mg/dL.  PREPARE RBC (CROSSMATCH)     Status: Normal   Collection Time   06/08/11 12:01 PM      Component Value Range Comment   Order Confirmation ORDER PROCESSED BY BLOOD BANK      Ct Abdomen Pelvis Wo Contrast  06/08/2011  *RADIOLOGY REPORT*  Clinical Data: Anemia.  History of abdominal aortic  aneurysm. COPD.  Right lower quadrant pain.  CT ABDOMEN AND PELVIS WITHOUT CONTRAST  Technique:  Multidetector CT imaging of the abdomen and pelvis was performed following the standard protocol without intravenous contrast.  Comparison: Earlier today CTA of the abdomen at 0500 hours.Abdominal pelvic CT of 03/10/2011.  Findings: Scarring at the right lung base anteriorly.  Developing airspace disease at the right lung base.  Mild scarring at the left lung base.  Cardiomegaly without pericardial or pleural effusion.  Exam degraded by lack of IV contrast.  There is contrast within the renal collecting systems and bladder from the CTA of earlier in the day.  Normal liver, spleen, stomach, pancreas.  Gallstones without acute cholecystitis or biliary ductal dilatation.  Normal adrenal glands. Right greater than left bilateral renal cysts.  Repair of abdominal aortic aneurysm.  Better evaluated on earlier contrast enhanced study.  No surrounding hemorrhage or retroperitoneal adenopathy.  Sigmoid diverticulosis with probable muscular hypertrophy.  Normal terminal ileum. Appendix not visualized.  Normal caliber of small bowel loops.  No pneumatosis, free intraperitoneal air, or ascites.  No pelvic adenopathy.  Normal appearance of the anastomotic sites within the groins bilaterally.  No evidence of complicating fluid collection or surrounding hemorrhage.  Urinary bladder is mildly distended.  Moderate prostatomegaly. No significant free fluid.  Mild osteopenia.  Degenerative partial fusion of the bilateral sacroiliac joints.  Lower lumbar spondylosis.  IMPRESSION:  1.  No acute findings within the pelvis. 2.  No interval change within the abdomen since the CTA of the abdomen of 4 hours ago. 3.  Cholelithiasis.  4.  Degraded exam secondary lack of IV contrast. 5.  Suspect developing right base infection or aspiration.  Original Report Authenticated By: Consuello Bossier, M.D.   Dg Ribs Unilateral W/chest Right  06/08/2011   *RADIOLOGY REPORT*  Clinical Data: Cough, congestion, near syncope, right-sided chest and abdomen pain  RIGHT RIBS AND CHEST - 3+ VIEW  Comparison: 05/06/2011  Findings: Shallow inspiration.  Emphysematous changes in the lungs. Scattered fibrosis.  No focal airspace consolidation.  Normal heart size and pulmonary vascularity.  Tortuous and ectatic aorta.  No pneumothorax.  Right ribs appear intact without displaced fracture.  IMPRESSION: Emphysematous changes and fibrosis in the lungs.  No evidence of active pulmonary disease.  No displaced right rib fractures.  Original Report Authenticated By: Marlon Pel, M.D.   Ct Angio Chest W/cm &/or Wo Cm  06/08/2011  *RADIOLOGY REPORT*  Clinical Data:  New onset abdominal pain, back pain, and rib cage pain.  CT ANGIOGRAPHY CHEST, ABDOMEN AND PELVIS  Technique:  Multidetector CT imaging through the chest and abdomen was performed using the standard protocol during bolus administration of intravenous contrast.  Multiplanar reconstructed images including MIPs were obtained and reviewed to evaluate the vascular anatomy.  Contrast: OMNIPAQUE IOHEXOL 350 MG/ML IV SOLN  Comparison:  CT chest 04/14/2011  CTA CHEST  Findings:  Unenhanced CT images of the chest and abdomen demonstrate thoracic aortic ectasia with calcification.  Coronary artery calcification.  No evidence of intramural thrombus.  Tortuous and ectatic thoracic aorta.  Maximal AP diameter is about 3.5 cm in the ascending aorta.  No evidence of dissection.  No abnormal mediastinal or pleural fluid collections.  No significant lymphadenopathy in the chest.  No pleural effusion.  A filling defect is demonstrated and anterior right upper lung pulmonary artery suggesting subsegmental pulmonary embolus.  No other pulmonary arterial filling defects are demonstrated.  Emphysematous changes and interstitial fibrosis in the lungs.  Atelectasis in the lung bases.  No pneumothorax.  Nodules seen previously in  the  superior segment of the left lower lung is not visualized today and may have been inflammatory. Enlarged nodular thyroid gland as previously demonstrated.   Review of the MIP images confirms the above findings.  IMPRESSION: Ectatic thoracic aorta without aneurysm or dissection.  Filling defect in a right upper lung pulmonary arteries suggesting subsegmental pulmonary embolus.  Emphysema and fibrosis in the lungs.  CTA ABDOMEN  Findings:  The study is abdominal and extends to the upper aspect of the iliac crest.  This does not include the entire aneurysm repair.  Distal aneurysm sac and repair remains indeterminate as it is not visualized.  There is an infrarenal abdominal aortic aneurysm post repair. Calcification of the native sac with thrombosis of the native sac and patency of the graft.  No retroperitoneal fluid collection or contrast extravasation.  Atherosclerotic changes in the upper abdominal aorta.  Vascular calcifications in the branch vessels. Superior mesenteric artery, celiac axis, and renal artery origins appear patent.  The inferior mesenteric artery is not demonstrated.Calcified granulomas in the spleen.  The liver parenchyma is homogeneous.  Cholelithiasis.  No bile duct dilatation.  The pancreas and adrenal glands are unremarkable. Suggestion of small stones versus vascular calcifications throughout the kidneys.  No evidence of obstruction.  Cyst in the right kidney measures about 3.3 cm diameter.  Visualized stomach, small bowel, and large bowel are decompressed.  No free air or free fluid in the abdomen.  Degenerative changes in the thoracic and lumbar spine.   Review of the MIP images confirms the above findings.  IMPRESSION: Postoperative change with repair of abdominal aortic aneurysm. This is not completely included on the study.  No retroperitoneal fluid collection or contrast extravasation demonstrated in the visualized abdomen.  No free air or free fluid in the abdomen. Cholelithiasis.   Results discussed with Dr. Norlene Campbell at the time of dictation, [phone] hours on 06/08/2011.  Original Report Authenticated By: Marlon Pel, M.D.   Ct Angio Abdomen W/cm &/or Wo Contrast  06/08/2011  *RADIOLOGY REPORT*  Clinical Data:  New onset abdominal pain, back pain, and rib cage pain.  CT ANGIOGRAPHY CHEST, ABDOMEN AND PELVIS  Technique:  Multidetector CT imaging through the chest and abdomen was performed using the standard protocol during bolus administration of intravenous contrast.  Multiplanar reconstructed images including MIPs were obtained and reviewed to evaluate the vascular anatomy.  Contrast: OMNIPAQUE IOHEXOL 350 MG/ML IV SOLN  Comparison:  CT chest 04/14/2011  CTA CHEST  Findings:  Unenhanced CT images of the chest and abdomen demonstrate thoracic aortic ectasia with calcification.  Coronary artery calcification.  No evidence of intramural thrombus.  Tortuous and ectatic thoracic aorta.  Maximal AP diameter is about 3.5 cm in the ascending aorta.  No evidence of dissection.  No abnormal mediastinal or pleural fluid collections.  No significant lymphadenopathy in the chest.  No pleural effusion.  A filling defect is demonstrated and anterior right upper lung pulmonary artery suggesting subsegmental pulmonary embolus.  No other pulmonary arterial filling defects are demonstrated.  Emphysematous changes and interstitial fibrosis in the lungs.  Atelectasis in the lung bases.  No pneumothorax.  Nodules seen previously in the superior segment of the left lower lung is not visualized today and may have been inflammatory. Enlarged nodular thyroid gland as previously demonstrated.   Review of the MIP images confirms the above findings.  IMPRESSION: Ectatic thoracic aorta without aneurysm or dissection.  Filling defect in a right upper lung pulmonary arteries suggesting subsegmental pulmonary  embolus.  Emphysema and fibrosis in the lungs.  CTA ABDOMEN  Findings:  The study is abdominal and extends  to the upper aspect of the iliac crest.  This does not include the entire aneurysm repair.  Distal aneurysm sac and repair remains indeterminate as it is not visualized.  There is an infrarenal abdominal aortic aneurysm post repair. Calcification of the native sac with thrombosis of the native sac and patency of the graft.  No retroperitoneal fluid collection or contrast extravasation.  Atherosclerotic changes in the upper abdominal aorta.  Vascular calcifications in the branch vessels. Superior mesenteric artery, celiac axis, and renal artery origins appear patent.  The inferior mesenteric artery is not demonstrated.Calcified granulomas in the spleen.  The liver parenchyma is homogeneous.  Cholelithiasis.  No bile duct dilatation.  The pancreas and adrenal glands are unremarkable. Suggestion of small stones versus vascular calcifications throughout the kidneys.  No evidence of obstruction.  Cyst in the right kidney measures about 3.3 cm diameter.  Visualized stomach, small bowel, and large bowel are decompressed.  No free air or free fluid in the abdomen.  Degenerative changes in the thoracic and lumbar spine.   Review of the MIP images confirms the above findings.  IMPRESSION: Postoperative change with repair of abdominal aortic aneurysm. This is not completely included on the study.  No retroperitoneal fluid collection or contrast extravasation demonstrated in the visualized abdomen.  No free air or free fluid in the abdomen. Cholelithiasis.  Results discussed with Dr. Norlene Campbell at the time of dictation, [phone] hours on 06/08/2011.  Original Report Authenticated By: Marlon Pel, M.D.    ROS  See admission history and physical.  Blood pressure 120/70, pulse 70, temperature 98.1 F (36.7 C), temperature source Oral, resp. rate 18, SpO2 100.00%. Physical Exam  Heent - unremarkable - airway -1 Heart - RRR with occasional ectopic beat Lungs - rhonchi - clears with cough Abd - soft non  tender   Assessment/Plan Retrievable IVC filter placement today with local anesthetic for newly diagnosed pulmonary embolus with severe anemia of uncertain etiology.  [name] 06/08/2011, 12:16 PM

## 2011-06-08 NOTE — Progress Notes (Signed)
[facility] [place]  My partner was called to see Mr. Darious Rehman for decreasing H/H in the setting of recent AAA repair (03/30/11).  I have briefly reviewed the CTA Chest/Abd and it reveals a PE.  Unfortunately, the abominal CT does not image the pelvis, so the entirety of the abdomen was not visualized.  The portion visualized shows no evidence of extravasation.  I recommended: non-contrast ABD/PELVIS to completely visualize the abdomen.  I don't think the renal risk of another dye load as needed for a repeat CTA ABD/PELVIS is justified, as a retroperitoneal bleed can be visualized with the non-contrast imaging.  - Once the re-scan is completed, I will check on the patient  [name], MD [facility] [place] Office: [phone] Pager: [phone]  06/08/2011, 7:57 AM

## 2011-06-08 NOTE — ED Notes (Signed)
Pt. Is resting comfortablly, family at bedside.  Pt. Denies any sob or pain.  Pt. Continues to show sob on exertion.

## 2011-06-08 NOTE — Progress Notes (Signed)
[facility]   Daily Progress Note  Assessment/Planning: 1. Pulmonary embolus 2. s/p AoBiFem BPG w/ L CIAA ligation (03/30/11)   I have reviewed the non-contrast CT and find no evidence of retroperitoneal hemorrhage to account for this patient's blood loss  Transfusion per primary team  Patient has known chronic pulmonary issues also some hematologic issues that are being managed as outpatient: recommend obtaining PCCM and Hematology input  At previous admission, some discussion of whether or not bone marrow biopsy would be necessary to evaluate for a primary hematologic disorder  Patient also has known problem with poor PO intake so appetite stimulant recommended  Will continue to follow with you periodically  Subjective   Difficulty breathing, no abdominal or back pain  Objective Filed Vitals:   06/08/11 0643 06/08/11 0715 06/08/11 0745 06/08/11 1032  BP: 90/43 97/48 118/65 120/70  Pulse: 70 75  70  Temp:    98.1 F (36.7 C)  TempSrc:    Oral  Resp: 27 26 25 18   SpO2: 100% 100%  100%   No intake or output data in the 24 hours ending 06/08/11 1041  PULM  Faint B rales CV  RRR GI  soft, NTND, incision well healed VASC  B groins well healed, easily palpable graft pulse, B feet warm  Laboratory CBC    Component Value Date/Time   WBC 10.7* 06/08/2011 0321   WBC 9.7 05/26/2011 0851   HGB 4.6* 06/08/2011 0813   HGB 10.3* 05/26/2011 0851   HCT 14.5* 06/08/2011 0813   HCT 30.7* 05/26/2011 0851   PLT 188 06/08/2011 0321   PLT 140 05/26/2011 0851    BMET    Component Value Date/Time   NA 137 06/08/2011 0321   K 3.2* 06/08/2011 0321   CL 100 06/08/2011 0321   CO2 19 06/08/2011 0321   GLUCOSE 147* 06/08/2011 0321   BUN 21 06/08/2011 0321   CREATININE 1.38* 06/08/2011 0321   CALCIUM 9.5 06/08/2011 0321   GFRNONAA 49* 06/08/2011 0321   GFRAA 57* 06/08/2011 0321    [name], MD [facility]  [place] Office: [phone] Pager: [phone]  06/08/2011, 10:41 AM

## 2011-06-09 ENCOUNTER — Encounter (HOSPITAL_COMMUNITY): Payer: Self-pay | Admitting: *Deleted

## 2011-06-09 ENCOUNTER — Other Ambulatory Visit: Payer: Self-pay | Admitting: Vascular Surgery

## 2011-06-09 DIAGNOSIS — D509 Iron deficiency anemia, unspecified: Secondary | ICD-10-CM

## 2011-06-09 DIAGNOSIS — I369 Nonrheumatic tricuspid valve disorder, unspecified: Secondary | ICD-10-CM

## 2011-06-09 DIAGNOSIS — D599 Acquired hemolytic anemia, unspecified: Secondary | ICD-10-CM

## 2011-06-09 DIAGNOSIS — I2699 Other pulmonary embolism without acute cor pulmonale: Secondary | ICD-10-CM

## 2011-06-09 DIAGNOSIS — E876 Hypokalemia: Secondary | ICD-10-CM | POA: Diagnosis not present

## 2011-06-09 LAB — COMPREHENSIVE METABOLIC PANEL
BUN: 22 mg/dL (ref 6–23)
Calcium: 8.7 mg/dL (ref 8.4–10.5)
Creatinine, Ser: 1.24 mg/dL (ref 0.50–1.35)
GFR calc Af Amer: 65 mL/min — ABNORMAL LOW (ref >90)
Glucose, Bld: 159 mg/dL — ABNORMAL HIGH (ref 70–99)
Sodium: 139 mEq/L (ref 135–145)
Total Protein: 6.9 g/dL (ref 6.0–8.3)

## 2011-06-09 LAB — CBC
HCT: 15.2 % — ABNORMAL LOW (ref 39.0–52.0)
HCT: 16.9 % — ABNORMAL LOW (ref 39.0–52.0)
Hemoglobin: 5.1 g/dL — CL (ref 13.0–17.0)
MCH: 35 pg — ABNORMAL HIGH (ref 26.0–34.0)
MCH: 34.5 pg — ABNORMAL HIGH (ref 26.0–34.0)
MCHC: 31.2 g/dL (ref 30.0–36.0)
MCHC: 30.2 g/dL (ref 30.0–36.0)
MCV: 112.2 fL — ABNORMAL HIGH (ref 78.0–100.0)
MCV: 113.4 fL — ABNORMAL HIGH (ref 78.0–100.0)
Platelets: UNDETERMINED 10*3/uL (ref 150–400)
WBC: 15.7 10*3/uL — ABNORMAL HIGH (ref 4.0–10.5)

## 2011-06-09 LAB — BASIC METABOLIC PANEL
BUN: 17 mg/dL (ref 6–23)
Calcium: 8.5 mg/dL (ref 8.4–10.5)
GFR calc non Af Amer: 58 mL/min — ABNORMAL LOW (ref >90)
Glucose, Bld: 107 mg/dL — ABNORMAL HIGH (ref 70–99)

## 2011-06-09 LAB — TSH: TSH: 0.143 u[IU]/mL — ABNORMAL LOW (ref 0.350–4.500)

## 2011-06-09 MED ORDER — LORAZEPAM 1 MG PO TABS: 1.0000 mg | ORAL_TABLET | Freq: Once | ORAL | Status: DC

## 2011-06-09 MED ORDER — METHYLPREDNISOLONE SODIUM SUCC 125 MG IJ SOLR
60.0000 mg | Freq: Once | INTRAMUSCULAR | Status: AC
  Administered 2011-06-09: 60 mg via INTRAVENOUS
  Filled 2011-06-09: qty 0.96

## 2011-06-09 MED ORDER — PREDNISONE 20 MG PO TABS
20.0000 mg | ORAL_TABLET | Freq: Once | ORAL | Status: AC
  Administered 2011-06-09: 20 mg via ORAL
  Filled 2011-06-09 (×2): qty 1

## 2011-06-09 MED ORDER — PREDNISONE 50 MG PO TABS
55.0000 mg | ORAL_TABLET | Freq: Every day | ORAL | Status: DC
  Administered 2011-06-10: 55 mg via ORAL
  Filled 2011-06-09 (×2): qty 1

## 2011-06-09 MED ORDER — SODIUM CHLORIDE 0.9 % IV SOLN
Freq: Once | INTRAVENOUS | Status: AC
  Administered 2011-06-11: 250 mL via INTRAVENOUS

## 2011-06-09 MED ORDER — FOLIC ACID 1 MG PO TABS
1.0000 mg | ORAL_TABLET | Freq: Two times a day (BID) | ORAL | Status: DC
  Administered 2011-06-09 – 2011-06-13 (×9): 1 mg via ORAL
  Filled 2011-06-09 (×13): qty 1

## 2011-06-09 MED ORDER — FERROUS FUMARATE 325 (106 FE) MG PO TABS
1.0000 | ORAL_TABLET | Freq: Two times a day (BID) | ORAL | Status: DC
  Administered 2011-06-09: 106 mg via ORAL
  Filled 2011-06-09 (×4): qty 1

## 2011-06-09 MED ORDER — SODIUM CHLORIDE 0.9 % IV SOLN
INTRAVENOUS | Status: DC
  Administered 2011-06-09: 20:00:00 via INTRAVENOUS

## 2011-06-09 NOTE — Progress Notes (Signed)
06-08-11 2300 Due to pt hemolytic anemia, and Hgb of 4.4 MD was called. MD rounded on pt and consulted hematology. Pt is resting comfortably at bedside. MD wants to hold order of transfusion of 3 units due to pt hemolytic anemia. Will call if any changes occur in pt status.   Elisha Headland RN

## 2011-06-09 NOTE — Progress Notes (Signed)
PRELIMINARY  PRELIMINARY  PRELIMINARY  PRELIMINARY  Bilateral lower extremity venous duplex completed.    Preliminary report:  Right:  No evidence of DVT or Baker's cyst.  Left: DVT noted in the posterior tibial vein.  No Baker's cyst. Incidental finding:  Diminished flow in bilateral femoral arteries.     [name], RVT Albertia Carvin  06/09/2011 9:53 AM

## 2011-06-09 NOTE — Progress Notes (Signed)
Patient ID: [name], male   DOB: 12-06-1934, 76 y.o.   MRN: [id number] Subjective: No events overnight. Patient still complaints of chest pain but reports it as less than on admission. Objective:  Vital signs in last 24 hours:  Filed Vitals:   06/09/11 0400 06/09/11 0448 06/09/11 0800 06/09/11 1015  BP: 111/60  126/61 126/66  Pulse: 17  20   Temp: 98.5 F (36.9 C)  97.9 F (36.6 C) 97.5 F (36.4 C)  TempSrc: Oral  Oral Oral  Resp: 17  20 19   Height:      Weight:  54.2 kg (119 lb 7.8 oz)    SpO2: 100%  99% 99%    Intake/Output from previous day:   Intake/Output Summary (Last 24 hours) at 06/09/11 1146 Last data filed at 06/09/11 1106  Gross per 24 hour  Intake   1100 ml  Output   1445 ml  Net   -345 ml    Physical Exam: General: lethargic but oriented x3, in no acute distress. HEENT: No bruits, no goiter. Moist mucous membranes, no scleral icterus, no conjunctival pallor. Heart: Regular rate and rhythm, without murmurs, rubs, gallops. Lungs: Clear to auscultation bilaterally. No wheezing, no rhonchi, no rales.  Abdomen: Soft, nontender, nondistended, positive bowel sounds. Extremities: (+2) pedal edema,  positive pedal pulses. Neuro: Grossly intact, nonfocal.    Lab Results:  Basic Metabolic Panel:    Component Value Date/Time   NA 140 06/09/2011 0520   K 3.6 06/09/2011 0520   CL 109 06/09/2011 0520   CO2 19 06/09/2011 0520   BUN 17 06/09/2011 0520   CREATININE 1.21 06/09/2011 0520   GLUCOSE 107* 06/09/2011 0520   CALCIUM 8.5 06/09/2011 0520   CBC:    Component Value Date/Time   WBC 13.1* 06/09/2011 0520   WBC 9.7 05/26/2011 0851   HGB 4.3* 06/09/2011 0520   HGB 10.3* 05/26/2011 0851   HCT 13.8* 06/09/2011 0520   HCT 30.7* 05/26/2011 0851   PLT PLATELET CLUMPS NOTED ON SMEAR, UNABLE TO ESTIMATE 06/09/2011 0520   PLT 140 05/26/2011 0851   MCV 112.2* 06/09/2011 0520   MCV 81.9 05/26/2011 0851   NEUTROABS 8.0* 06/08/2011 0321   NEUTROABS 4.9  05/26/2011 0851   LYMPHSABS 1.9 06/08/2011 0321   LYMPHSABS 3.5* 05/26/2011 0851   MONOABS 0.8 06/08/2011 0321   MONOABS 1.0* 05/26/2011 0851   EOSABS 0.0 06/08/2011 0321   EOSABS 0.1 05/26/2011 0851   BASOSABS 0.0 06/08/2011 0321   BASOSABS 0.1 05/26/2011 0851      Lab 06/09/11 0520 06/08/11 2325 06/08/11 1834 06/08/11 1157 06/08/11 0813 06/08/11 0321  WBC 13.1* -- -- -- -- 10.7*  HGB 4.3* 4.4* 4.6* 4.8* 4.6* --  HCT 13.8* 14.2* 14.8* 15.1* 14.5* --  PLT PLATELET CLUMPS NOTED ON SMEAR, UNABLE TO ESTIMATE -- -- -- -- 188  MCV 112.2* -- -- -- -- 108.9*  MCH 35.0* -- -- -- -- 35.4*  MCHC 31.2 -- -- -- -- 32.6  RDW NOT CALCULATED -- -- -- -- 32.2*  LYMPHSABS -- -- -- -- -- 1.9  MONOABS -- -- -- -- -- 0.8  EOSABS -- -- -- -- -- 0.0  BASOSABS -- -- -- -- -- 0.0  BANDABS -- -- -- -- -- --    Lab 06/09/11 0520 06/08/11 0321  NA 140 137  K 3.6 3.2*  CL 109 100  CO2 19 19  GLUCOSE 107* 147*  BUN 17 21  CREATININE 1.21 1.38*  CALCIUM 8.5 9.5  MG -- --    Lab 06/08/11 0813 06/08/11 0455  INR 1.23 1.20  PROTIME -- --   Cardiac markers: No results found for this basename: CK:3,CKMB:3,TROPONINI:3,MYOGLOBIN:3 in the last 168 hours No components found with this basename: POCBNP:3 Recent Results (from the past 240 hour(s))  TECHNOLOGIST SMEAR REVIEW     Status: Normal   Collection Time   06/08/11  8:13 AM      Component Value Range Status Comment   Path Review MARKED POLYCHROMASIA   Final   MRSA PCR SCREENING     Status: Normal   Collection Time   06/08/11  7:13 PM      Component Value Range Status Comment   MRSA by PCR NEGATIVE  NEGATIVE  Final     Studies/Results:  Ct Abdomen Pelvis Wo Contrast  06/08/2011    IMPRESSION:  1.  No acute findings within the pelvis. 2.  No interval change within the abdomen since the CTA of the abdomen of 4 hours ago. 3.  Cholelithiasis.  4.  Degraded exam secondary lack of IV contrast. 5.  Suspect developing right base infection or  aspiration.  Original Report Authenticated By: Consuello Bossier, M.D.   Dg Ribs Unilateral W/chest Right  06/08/2011 IMPRESSION: Emphysematous changes and fibrosis in the lungs.  No evidence of active pulmonary disease.  No displaced right rib fractures.    Ct Angio Chest W/cm &/or Wo Cm  06/08/2011  .  IMPRESSION: Postoperative change with repair of abdominal aortic aneurysm. This is not completely included on the study.  No retroperitoneal fluid collection or contrast extravasation demonstrated in the visualized abdomen.  No free air or free fluid in the abdomen. Cholelithiasis.    Ct Angio Abdomen W/cm &/or Wo Contrast  06/08/2011  IMPRESSION: Postoperative change with repair of abdominal aortic aneurysm. This is not completely included on the study.  No retroperitoneal fluid collection or contrast extravasation demonstrated in the visualized abdomen.  No free air or free fluid in the abdomen. Cholelithiasis.    Ir Ivc Filter Placement  06/08/2011   IMPRESSION: 1.  Normal IVC. No thrombus or significant anatomic variation. 2.  Technically successful infrarenal IVC filter placement. This is a retrievable model.     Medications: Scheduled Meds:   . sodium chloride   Intravenous Once  . dextromethorphan-guaiFENesin  1-2 tablet Oral Q12H  . digoxin  125 mcg Oral Daily  . ferrous sulfate  325 mg Oral Q breakfast  . furosemide  10 mg Intravenous Once  . LORazepam  1 mg Oral Once  . methylPREDNISolone (SOLU-MEDROL) injection  60 mg Intravenous Once  . pantoprazole (PROTONIX) IV  80 mg Intravenous Once   Continuous Infusions:   . sodium chloride 50 mL/hr at 06/08/11 2348  . sodium chloride    . pantoprozole (PROTONIX) infusion 8 mg/hr (06/09/11 0600)   PRN Meds:.albuterol, diphenhydrAMINE, guaiFENesin-dextromethorphan, HYDROcodone-acetaminophen, iohexol, ondansetron (ZOFRAN) IV, ondansetron  Assessment/Plan:  Principal Problem:  *Pulmonary embolism - patient is status post IVC  filter placement; as per pulmonary recommendation no anticoagulation for now but we will follow up if there is an change in recommendation.  Active Problems:  Abdominal aneurysm without mention of rupture - stable   COPD (chronic obstructive pulmonary disease) - continue nebulizer treatments PRN and solumedrol   Iron deficiency anemia - awaiting for blood transfusion; GI was consulted and endoscopy was deferred for 06/10/11 to allow for patient's hemoglobin to increase   Chest pain - likely secondary to pulmonary embolism; we will  follow up on final results of 2-D ECHO  Hypokalemia - unclear etiology; resolved at present    LOS: 1 day   [name] 06/09/2011, 11:46 AM

## 2011-06-09 NOTE — Progress Notes (Signed)
Utilization Review Completed.  Seidy Labreck T  06/09/2011 

## 2011-06-09 NOTE — Progress Notes (Signed)
[name] 10:51 AM  Subjective: The patient is asymptomatic from a GI standpoint and has no specific complaints  Objective: Vital signs stable afebrile no acute distress abdomen is soft nontender hemoglobin unchanged he has not had a transfusion yet  Assessment: Chronic anemia probably of chronic disease  Plan: Will hold endoscopy until hemoglobin higher and according to the nurse he has antibodies and they are  waiting on special blood so we will reschedule him for tomorrow and allow him to eat today and I discussed the above with the patient and his wife and please see yesterday's note for a discussion about a colonoscopy in the future [facility]

## 2011-06-09 NOTE — Progress Notes (Signed)
CRITICAL VALUE ALERT  Critical value received:hemoglo  Date of notification: 06/09/11  Time of notification:  0700  Critical value read back:yes  Nurse who received alert:Shawnette Augello,  1st page:   Doctor:   2nd Page:   Doctor:

## 2011-06-09 NOTE — Progress Notes (Signed)
Speech Language Pathology SLP Cancellation Note   Bedside swallow evaluation cancelled today due to patient receiving several procedures today, requiring NPO status. Will re-attempt this afternoon, otherwise in am on 06/10/11.  Myra Rude, M.S.,CCC-SLP Pager [phone]

## 2011-06-09 NOTE — Progress Notes (Signed)
06-09-11 0100 MD wants pediatric tubes to be used on the pt for all blood draws. MD wrote order. Lab/ Phlebotomy aware.  Elisha Headland RN

## 2011-06-09 NOTE — Progress Notes (Signed)
  Echocardiogram 2D Echocardiogram has been performed.  Sheriann Newmann Nira Retort 06/09/2011, 3:28 PM

## 2011-06-09 NOTE — Progress Notes (Signed)
CRITICAL VALUE ALERT  Critical value received:  Hgb 4.4  Date of notification:  06-08-11  Time of notification:  2300  Critical value read back: yes  Nurse who received alert:  Elisha Headland   MD notified (1st page):  [place]out[place]   Time of first page:  2300  MD notified (2nd page):  Time of second page:  Responding MD:  Adela Glimpse  Time MD responded:  2305  Md made aware of pt low Hgb. Pt previous Hgbs have been: 4.8 in the ED and 4.6 at 1900. MD wrote new orders for Solumedrol and has consulted Hematology.   Elisha Headland RN

## 2011-06-09 NOTE — Progress Notes (Signed)
Reason for Consult: anemia Referring Physician: hospitalist    HPI: Patient is a very pleasant 76 yo African American gentleman whom I met initially in Sept. 2012 when he presented with apparent  spontaneous bleed into his right upper extremity; he had not had any  medical care in over 10 years when he presented then.  CBC on March 08, 2011 without priors for  Comparison.:White count 6.8, hemoglobin 11, platelets 100,000. His  platelets dropped to 84,000 during that hospitalization then improved to  124,000 by discharge. The evaluation included CT abdomen and pelvis  March 10, 2011 with normal size spleen, no obvious cirrhotic  changes of the liver and no significant adenopathy. HIT screen was  negative. This was not obviously ITP, no evidence of DIC, no Von  Willebrand, and no myeloma that is apparent. He was iron deficient with  fecal occult blood negative x1 and he was begun on oral iron during that  admission. LDH was normal at 188 and Tbili was 0.5.He had some  blood in urinalysis in  September. The right upper extremity bleed resolved. . He had an CBC at the [facility] on March 30, 2011 with white count 6.3, hemoglobin 11.5, platelets 98,000  He denies any alcohol in over 30 years. He has been a long smoker,  perhaps as much as 40 pack years, this discontinued in Sept. Two-view chest x-ray done March 11, 2011 showed  hyperinflation and chronic interstitial changes. The CT abdomen and  pelvis also showed simple cyst in his kidneys bilaterally with the  urinary tract otherwise not remarkable. That same CT identified the  abdominal aortic aneurysm and iliac aneurysm. 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 I saw him back at [facility] on 05-26-11, at which time he was not on oral iron and this was  resumed, and he had new onset lower respiratory infection. CBC then had Hgb 10.3 and plt 140k; chemistries not done. He was begun on levaquin and the respiratory symptoms were much improved by follow up visit with [place] [facility] on Dec3. He has continued oral iron as instructed since that visit. Patient's wife tells me that he had done generally well until Dec 8 and Dec 9 when he was more tired and complained of left lateral lower chest pain; he was much weaker and confused on Dec 11 with admission from ED then, with Hgb 4.3 and pulmonary embolus found. He has had no overt bleeding. IVC filter was placed yesterday. Other labs on admission remarkable for haptoglobin 71, LDH 344, iron 30 with % sat 13, Tbili5.2, INR 1.2, nucleated RBCs and polychromasia called.Retics 42%. Platelets were 140k and WBC 10.7. DAT IgG positive. My partner on call reviewed all of information last pm, however I believe patient was in procedures for the PE then and recommendations for steroids/ holding PRBC transfusion was communicated to other physicians then. He has not been transfused this admission to date; he had solumedrol 125mg  at 0100 today as one time dose per EMR. He has had no overt bleeding and has felt better thru day today per wife and patient now; he has eaten and denies pain or significant SOB at rest with [place] O2. There does not appear to be any problem with the recent aneurysm repairs.  Review of Systems: as above. Some cough yesterday productive of clear sputum, none  today. No fever PTA. No pain now. No LE swelling. Appetite better today, no N/V.  .   @ROS @  Allergies: No Known Allergies Past Medical History  Diagnosis Date  . AAA (abdominal aortic aneurysm)   . Thrombocytopenia   . Abdominal aneurysm without mention of rupture 04/23/2011  . Peripheral arterial disease 04/23/2011  . COPD (chronic obstructive pulmonary disease) 05/06/2011  . Shortness of breath   . Anemia   . Iron deficiency anemia  05/26/2011  . He had liver and cervical lymph  node biopsies in the 1960s when he was in the [employer] which he  understands showed no problems. No prior diagnosis of anemia or low  platelet known though again he did not have any regular care in over 10  years and previously was seen only by a physician at his place of  employment. He has had no other surgery.    Past Surgical History  Procedure Date  . Abdominal aortic aneurysm repair   . Pr vein bypass graft,aorto-fem-pop     Family History  Problem Relation Age of Onset  . Cancer Mother     breast  . Hypertension Father   . Diabetes Sister   . COPD Brother     Social History: 40 year smoking history DC'd Sept 2012. 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 No ETOH x 30-40 yrs. Medications:  Scheduled, prn and prior to admission.  Blood pressure 113/59, pulse 78, temperature 98.1 F (36.7 C), temperature source Oral, resp. rate 20, height 5\' 11"  (1.803 m), weight 119 lb 7.8 oz (54.2 kg), SpO2 98.00%. Wife and another family member at bedside. Rouses easily to voice, appropriate responses, NAD at 30 degrees with [place] O2 on. PERRL. Slightly icteric. Oral mucosa clear and moist. Nailbeds pale. No use of accessory muscles of respiration. Cor RRR. Abd soft, quiet, not tender, cannot appreciate HSM. LE no edema/cords/tenderness. No swelling RUE at area of previous bleed. No skin rash.    Results for orders placed during the hospital encounter of 06/08/11 (from the past 48 hour(s))  CBC     Status: Abnormal   Collection Time   06/08/11  3:21 AM      Component Value Range Comment   WBC 10.7 (*) 4.0 - 10.5 (K/uL)    RBC 1.58 (*) 4.22 - 5.81 (MIL/uL)    Hemoglobin 5.6 (*) 13.0 - 17.0 (g/dL)    HCT 16.1 (*) 09.6 - 52.0 (%)    MCV 108.9 (*) 78.0 - 100.0 (fL)    MCH 35.4 (*) 26.0 - 34.0 (pg)    MCHC 32.6  30.0 - 36.0 (g/dL)    RDW 04.5 (*) 40.9 - 15.5 (%)     Platelets 188  150 - 400 (K/uL)   DIFFERENTIAL     Status: Abnormal   Collection Time   06/08/11  3:21 AM      Component Value Range Comment   Neutrophils Relative 75  43 - 77 (%)    Lymphocytes Relative 18  12 - 46 (%)    Monocytes Relative 7  3 - 12 (%)    Eosinophils Relative 0  0 - 5 (%)    Basophils Relative 0  0 - 1 (%)    Band Neutrophils 0  0 - 10 (%)    Metamyelocytes Relative 0      Myelocytes 0      Promyelocytes Absolute 0  Blasts 0      nRBC 0  0 (/100 WBC)    Neutro Abs 8.0 (*) 1.7 - 7.7 (K/uL)    Lymphs Abs 1.9  0.7 - 4.0 (K/uL)    Monocytes Absolute 0.8  0.1 - 1.0 (K/uL)    Eosinophils Absolute 0.0  0.0 - 0.7 (K/uL)    Basophils Absolute 0.0  0.0 - 0.1 (K/uL)    RBC Morphology MARKED POLYCHROMASIA   HOWELL/JOLLY BODIES  COMPREHENSIVE METABOLIC PANEL     Status: Abnormal   Collection Time   06/08/11  3:21 AM      Component Value Range Comment   Sodium 137  135 - 145 (mEq/L)    Potassium 3.2 (*) 3.5 - 5.1 (mEq/L)    Chloride 100  96 - 112 (mEq/L)    CO2 19  19 - 32 (mEq/L)    Glucose, Bld 147 (*) 70 - 99 (mg/dL)    BUN 21  6 - 23 (mg/dL)    Creatinine, Ser 1.47 (*) 0.50 - 1.35 (mg/dL)    Calcium 9.5  8.4 - 10.5 (mg/dL)    Total Protein 7.9  6.0 - 8.3 (g/dL)    Albumin 3.2 (*) 3.5 - 5.2 (g/dL)    AST 30  0 - 37 (U/L)    ALT 12  0 - 53 (U/L)    Alkaline Phosphatase 98  39 - 117 (U/L)    Total Bilirubin 5.2 (*) 0.3 - 1.2 (mg/dL)    GFR calc non Af Amer 49 (*) >90 (mL/min)    GFR calc Af Amer 57 (*) >90 (mL/min)   TYPE AND SCREEN     Status: Normal (Preliminary result)   Collection Time   06/08/11  4:50 AM      Component Value Range Comment   ABO/RH(D) A POS      Antibody Screen POS      Sample Expiration 06/11/2011      DAT, IgG POS      Antibody Identification WARM AUTOANTIBODY ANTI-C      Antibody ID,T [place] WARM AUTOANTIBODY      DAT, complement NEG      PT AG Type        Value: NEGATIVE FOR C ANTIGEN NEGATIVE FOR E ANTIGEN POSITIVE FOR c  ANTIGEN POSITIVE FOR e ANTIGEN NEGATIVE FOR KELL ANTIGEN NEGATIVE FOR DUFFY A ANTIGEN POSITIVE FOR DUFFY B ANTIGEN POSITIVE FOR KIDD A ANTIGEN NEGATIVE FOR KIDD B ANTIGEN NEGATIVE FOR S ANTIGEN      POSITIVE FOR s ANTIGEN   Unit Number [id number]      Blood Component Type RED CELLS,LR      Unit division 00      Status of Unit ALLOCATED      Transfusion Status OK TO TRANSFUSE      Crossmatch Result LEAST INCOMPATIBLE      Unit tag comment VERBAL ORDERS PER DR      Unit Number [id number]      Blood Component Type RED CELLS,LR      Unit division 00      Status of Unit ALLOCATED      Unit tag comment VERBAL ORDERS PER DR      Transfusion Status OK TO TRANSFUSE      Crossmatch Result LEAST INCOMPATIBLE      Unit Number [id number]      Blood Component Type RED CELLS,LR      Unit division 00      Status of Unit ALLOCATED  Unit tag comment VERBAL ORDERS PER DR      Transfusion Status OK TO TRANSFUSE      Crossmatch Result LEAST INCOMPATIBLE      Unit Number [id number]      Blood Component Type RED CELLS,LR      Unit division 00      Status of Unit ALLOCATED      Unit tag comment VERBAL ORDERS PER DR      Transfusion Status OK TO TRANSFUSE      Crossmatch Result LEAST INCOMPATIBLE      Unit Number [id number]      Blood Component Type RED CELLS,LR      Unit division 00      Status of Unit ALLOCATED      Unit tag comment VERBAL ORDERS PER DR      Transfusion Status OK TO TRANSFUSE      Crossmatch Result LEAST INCOMPATIBLE      Unit Number [id number]      Blood Component Type RED CELLS,LR      Unit division 00      Status of Unit ALLOCATED      Unit tag comment VERBAL ORDERS PER DR      Transfusion Status OK TO TRANSFUSE      Crossmatch Result LEAST INCOMPATIBLE     PREPARE RBC (CROSSMATCH)     Status: Normal   Collection Time   06/08/11  4:50 AM      Component Value Range Comment   Order Confirmation ORDER PROCESSED BY BLOOD BANK     PROTIME-INR     Status: Abnormal   Collection  Time   06/08/11  4:55 AM      Component Value Range Comment   Prothrombin Time 15.5 (*) 11.6 - 15.2 (seconds)    INR 1.20  0.00 - 1.49    OCCULT BLOOD, POC DEVICE     Status: Normal   Collection Time   06/08/11  5:47 AM      Component Value Range Comment   Fecal Occult Bld NEGATIVE     VITAMIN B12     Status: Abnormal   Collection Time   06/08/11  8:13 AM      Component Value Range Comment   Vitamin B-12 958 (*) 211 - 911 (pg/mL)   FOLATE     Status: Normal   Collection Time   06/08/11  8:13 AM      Component Value Range Comment   Folate >20.0     IRON AND TIBC     Status: Abnormal   Collection Time   06/08/11  8:13 AM      Component Value Range Comment   Iron 30 (*) 42 - 135 (ug/dL)    TIBC 578 (*) 469 - 435 (ug/dL)    Saturation Ratios 18 (*) 20 - 55 (%)    UIBC 133  125 - 400 (ug/dL)   FERRITIN     Status: Abnormal   Collection Time   06/08/11  8:13 AM      Component Value Range Comment   Ferritin 2988 (*) 22 - 322 (ng/mL) Result confirmed by automatic dilution.  RETICULOCYTES     Status: Abnormal   Collection Time   06/08/11  8:13 AM      Component Value Range Comment   Retic Ct Pct 42.6 (*) 0.4 - 3.1 (%) RESULTS CONFIRMED BY MANUAL DILUTION   RBC. 1.33 (*) 4.22 - 5.81 (MIL/uL)    Retic Count, Manual 566.6 (*)  19.0 - 186.0 (K/uL)   LACTATE DEHYDROGENASE     Status: Abnormal   Collection Time   06/08/11  8:13 AM      Component Value Range Comment   LD 344 (*) 94 - 250 (U/L) HEMOLYSIS AT THIS LEVEL MAY AFFECT RESULT  HAPTOGLOBIN     Status: Normal   Collection Time   06/08/11  8:13 AM      Component Value Range Comment   Haptoglobin 71  30 - 200 (mg/dL)   HEMOGLOBIN AND HEMATOCRIT, BLOOD     Status: Abnormal   Collection Time   06/08/11  8:13 AM      Component Value Range Comment   Hemoglobin 4.6 (*) 13.0 - 17.0 (g/dL)    HCT 16.1 (*) 09.6 - 52.0 (%)   TECHNOLOGIST SMEAR REVIEW     Status: Normal   Collection Time   06/08/11  8:13 AM      Component Value  Range Comment   Path Review MARKED POLYCHROMASIA     PROTIME-INR     Status: Abnormal   Collection Time   06/08/11  8:13 AM      Component Value Range Comment   Prothrombin Time 15.8 (*) 11.6 - 15.2 (seconds)    INR 1.23  0.00 - 1.49    URINALYSIS, ROUTINE W REFLEX MICROSCOPIC     Status: Abnormal   Collection Time   06/08/11 10:30 AM      Component Value Range Comment   Color, Urine YELLOW  YELLOW     APPearance CLEAR  CLEAR     Specific Gravity, Urine 1.010  1.005 - 1.030     pH 5.0  5.0 - 8.0     Glucose, UA NEGATIVE  NEGATIVE (mg/dL)    Hgb urine dipstick NEGATIVE  NEGATIVE     Bilirubin Urine NEGATIVE  NEGATIVE     Ketones, ur NEGATIVE  NEGATIVE (mg/dL)    Protein, ur NEGATIVE  NEGATIVE (mg/dL)    Urobilinogen, UA 2.0 (*) 0.0 - 1.0 (mg/dL)    Nitrite NEGATIVE  NEGATIVE     Leukocytes, UA NEGATIVE  NEGATIVE  MICROSCOPIC NOT DONE ON URINES WITH NEGATIVE PROTEIN, BLOOD, LEUKOCYTES, NITRITE, OR GLUCOSE <1000 mg/dL.  HEMOGLOBIN AND HEMATOCRIT, BLOOD     Status: Abnormal   Collection Time   06/08/11 11:57 AM      Component Value Range Comment   Hemoglobin 4.8 (*) 13.0 - 17.0 (g/dL)    HCT 04.5 (*) 40.9 - 52.0 (%)   PREPARE RBC (CROSSMATCH)     Status: Normal   Collection Time   06/08/11 12:01 PM      Component Value Range Comment   Order Confirmation ORDER PROCESSED BY BLOOD BANK     PRO B NATRIURETIC PEPTIDE     Status: Abnormal   Collection Time   06/08/11 12:02 PM      Component Value Range Comment   Pro B Natriuretic peptide (BNP) 2717.0 (*) 0 - 125 (pg/mL)   SAVE SMEAR     Status: Normal   Collection Time   06/08/11 12:03 PM      Component Value Range Comment   Smear Review SMEAR STAINED AND AVAILABLE FOR REVIEW     SEDIMENTATION RATE     Status: Abnormal   Collection Time   06/08/11  1:00 PM      Component Value Range Comment   Sed Rate >140 (*) 0 - 16 (mm/hr)   POCT I-STAT TROPONIN I  Status: Normal   Collection Time   06/08/11  1:20 PM      Component  Value Range Comment   Troponin i, poc 0.02  0.00 - 0.08 (ng/mL)    Comment 3            HEMOGLOBIN AND HEMATOCRIT, BLOOD     Status: Abnormal   Collection Time   06/08/11  6:34 PM      Component Value Range Comment   Hemoglobin 4.6 (*) 13.0 - 17.0 (g/dL)    HCT 40.9 (*) 81.1 - 52.0 (%)   MRSA PCR SCREENING     Status: Normal   Collection Time   06/08/11  7:13 PM      Component Value Range Comment   MRSA by PCR NEGATIVE  NEGATIVE    TSH     Status: Abnormal   Collection Time   06/08/11  7:14 PM      Component Value Range Comment   TSH 0.143 (*) 0.350 - 4.500 (uIU/mL)   HEMOGLOBIN AND HEMATOCRIT, BLOOD     Status: Abnormal   Collection Time   06/08/11 11:25 PM      Component Value Range Comment   Hemoglobin 4.4 (*) 13.0 - 17.0 (g/dL)    HCT 91.4 (*) 78.2 - 52.0 (%)   BASIC METABOLIC PANEL     Status: Abnormal   Collection Time   06/09/11  5:20 AM      Component Value Range Comment   Sodium 140  135 - 145 (mEq/L)    Potassium 3.6  3.5 - 5.1 (mEq/L)    Chloride 109  96 - 112 (mEq/L) DELTA CHECK NOTED   CO2 19  19 - 32 (mEq/L)    Glucose, Bld 107 (*) 70 - 99 (mg/dL)    BUN 17  6 - 23 (mg/dL)    Creatinine, Ser 9.56  0.50 - 1.35 (mg/dL)    Calcium 8.5  8.4 - 10.5 (mg/dL)    GFR calc non Af Amer 58 (*) >90 (mL/min)    GFR calc Af Amer 67 (*) >90 (mL/min)   CBC     Status: Abnormal   Collection Time   06/09/11  5:20 AM      Component Value Range Comment   WBC 13.1 (*) 4.0 - 10.5 (K/uL) ADJUSTED FOR NUCLEATED RBC'S   RBC 1.23 (*) 4.22 - 5.81 (MIL/uL)    Hemoglobin 4.3 (*) 13.0 - 17.0 (g/dL)    HCT 21.3 (*) 08.6 - 52.0 (%)    MCV 112.2 (*) 78.0 - 100.0 (fL)    MCH 35.0 (*) 26.0 - 34.0 (pg)    MCHC 31.2  30.0 - 36.0 (g/dL)    RDW NOT CALCULATED  11.5 - 15.5 (%)    Platelets PLATELET CLUMPS NOTED ON SMEAR, UNABLE TO ESTIMATE  150 - 400 (K/uL)   CBC     Status: Abnormal   Collection Time   06/09/11 11:19 AM      Component Value Range Comment   WBC 15.7 (*) 4.0 - 10.5 (K/uL)  ADJUSTED FOR NUCLEATED RBC'S   RBC 1.34 (*) 4.22 - 5.81 (MIL/uL)    Hemoglobin 4.8 (*) 13.0 - 17.0 (g/dL)    HCT 57.8 (*) 46.9 - 52.0 (%)    MCV 113.4 (*) 78.0 - 100.0 (fL)    MCH 35.8 (*) 26.0 - 34.0 (pg)    MCHC 31.6  30.0 - 36.0 (g/dL)    RDW NOT CALCULATED  11.5 - 15.5 (%)  Platelets 155  150 - 400 (K/uL)   CBC     Status: Abnormal   Collection Time   06/09/11  3:21 PM      Component Value Range Comment   WBC 19.9 (*) 4.0 - 10.5 (K/uL)    RBC 1.48 (*) 4.22 - 5.81 (MIL/uL)    Hemoglobin 5.1 (*) 13.0 - 17.0 (g/dL)    HCT 16.1 (*) 09.6 - 52.0 (%)    MCV 114.2 (*) 78.0 - 100.0 (fL)    MCH 34.5 (*) 26.0 - 34.0 (pg)    MCHC 30.2  30.0 - 36.0 (g/dL)    RDW NOT CALCULATED  11.5 - 15.5 (%)    Platelets 139 (*) 150 - 400 (K/uL)     Ct Abdomen Pelvis Wo Contrast  06/08/2011  *RADIOLOGY REPORT*  Clinical Data: Anemia.  History of abdominal aortic aneurysm. COPD.  Right lower quadrant pain.  CT ABDOMEN AND PELVIS WITHOUT CONTRAST  Technique:  Multidetector CT imaging of the abdomen and pelvis was performed following the standard protocol without intravenous contrast.  Comparison: Earlier today CTA of the abdomen at 0500 hours.Abdominal pelvic CT of 03/10/2011.  Findings: Scarring at the right lung base anteriorly.  Developing airspace disease at the right lung base.  Mild scarring at the left lung base.  Cardiomegaly without pericardial or pleural effusion.  Exam degraded by lack of IV contrast.  There is contrast within the renal collecting systems and bladder from the CTA of earlier in the day.  Normal liver, spleen, stomach, pancreas.  Gallstones without acute cholecystitis or biliary ductal dilatation.  Normal adrenal glands. Right greater than left bilateral renal cysts.  Repair of abdominal aortic aneurysm.  Better evaluated on earlier contrast enhanced study.  No surrounding hemorrhage or retroperitoneal adenopathy.  Sigmoid diverticulosis with probable muscular hypertrophy.  Normal  terminal ileum. Appendix not visualized.  Normal caliber of small bowel loops.  No pneumatosis, free intraperitoneal air, or ascites.  No pelvic adenopathy.  Normal appearance of the anastomotic sites within the groins bilaterally.  No evidence of complicating fluid collection or surrounding hemorrhage.  Urinary bladder is mildly distended.  Moderate prostatomegaly. No significant free fluid.  Mild osteopenia.  Degenerative partial fusion of the bilateral sacroiliac joints.  Lower lumbar spondylosis.  IMPRESSION:  1.  No acute findings within the pelvis. 2.  No interval change within the abdomen since the CTA of the abdomen of 4 hours ago. 3.  Cholelithiasis.  4.  Degraded exam secondary lack of IV contrast. 5.  Suspect developing right base infection or aspiration.  Original Report Authenticated By: Consuello Bossier, M.D.   Dg Ribs Unilateral W/chest Right  06/08/2011  *RADIOLOGY REPORT*  Clinical Data: Cough, congestion, near syncope, right-sided chest and abdomen pain  RIGHT RIBS AND CHEST - 3+ VIEW  Comparison: 05/06/2011  Findings: Shallow inspiration.  Emphysematous changes in the lungs. Scattered fibrosis.  No focal airspace consolidation.  Normal heart size and pulmonary vascularity.  Tortuous and ectatic aorta.  No pneumothorax.  Right ribs appear intact without displaced fracture.  IMPRESSION: Emphysematous changes and fibrosis in the lungs.  No evidence of active pulmonary disease.  No displaced right rib fractures.  Original Report Authenticated By: Marlon Pel, M.D.   Ct Angio Chest W/cm &/or Wo Cm  06/08/2011  *RADIOLOGY REPORT*  Clinical Data:  New onset abdominal pain, back pain, and rib cage pain.  CT ANGIOGRAPHY CHEST, ABDOMEN AND PELVIS  Technique:  Multidetector CT imaging through the chest and abdomen  was performed using the standard protocol during bolus administration of intravenous contrast.  Multiplanar reconstructed images including MIPs were obtained and reviewed to evaluate  the vascular anatomy.  Contrast: OMNIPAQUE IOHEXOL 350 MG/ML IV SOLN  Comparison:  CT chest 04/14/2011  CTA CHEST  Findings:  Unenhanced CT images of the chest and abdomen demonstrate thoracic aortic ectasia with calcification.  Coronary artery calcification.  No evidence of intramural thrombus.  Tortuous and ectatic thoracic aorta.  Maximal AP diameter is about 3.5 cm in the ascending aorta.  No evidence of dissection.  No abnormal mediastinal or pleural fluid collections.  No significant lymphadenopathy in the chest.  No pleural effusion.  A filling defect is demonstrated and anterior right upper lung pulmonary artery suggesting subsegmental pulmonary embolus.  No other pulmonary arterial filling defects are demonstrated.  Emphysematous changes and interstitial fibrosis in the lungs.  Atelectasis in the lung bases.  No pneumothorax.  Nodules seen previously in the superior segment of the left lower lung is not visualized today and may have been inflammatory. Enlarged nodular thyroid gland as previously demonstrated.   Review of the MIP images confirms the above findings.  IMPRESSION: Ectatic thoracic aorta without aneurysm or dissection.  Filling defect in a right upper lung pulmonary arteries suggesting subsegmental pulmonary embolus.  Emphysema and fibrosis in the lungs.  CTA ABDOMEN  Findings:  The study is abdominal and extends to the upper aspect of the iliac crest.  This does not include the entire aneurysm repair.  Distal aneurysm sac and repair remains indeterminate as it is not visualized.  There is an infrarenal abdominal aortic aneurysm post repair. Calcification of the native sac with thrombosis of the native sac and patency of the graft.  No retroperitoneal fluid collection or contrast extravasation.  Atherosclerotic changes in the upper abdominal aorta.  Vascular calcifications in the branch vessels. Superior mesenteric artery, celiac axis, and renal artery origins appear patent.  The inferior  mesenteric artery is not demonstrated.Calcified granulomas in the spleen.  The liver parenchyma is homogeneous.  Cholelithiasis.  No bile duct dilatation.  The pancreas and adrenal glands are unremarkable. Suggestion of small stones versus vascular calcifications throughout the kidneys.  No evidence of obstruction.  Cyst in the right kidney measures about 3.3 cm diameter.  Visualized stomach, small bowel, and large bowel are decompressed.  No free air or free fluid in the abdomen.  Degenerative changes in the thoracic and lumbar spine.   Review of the MIP images confirms the above findings.  IMPRESSION: Postoperative change with repair of abdominal aortic aneurysm. This is not completely included on the study.  No retroperitoneal fluid collection or contrast extravasation demonstrated in the visualized abdomen.  No free air or free fluid in the abdomen. Cholelithiasis.  Results discussed with Dr. Norlene Campbell at the time of dictation, [phone] hours on 06/08/2011.  Original Report Authenticated By: Marlon Pel, M.D.   Ct Angio Abdomen W/cm &/or Wo Contrast  06/08/2011  *RADIOLOGY REPORT*  Clinical Data:  New onset abdominal pain, back pain, and rib cage pain.  CT ANGIOGRAPHY CHEST, ABDOMEN AND PELVIS  Technique:  Multidetector CT imaging through the chest and abdomen was performed using the standard protocol during bolus administration of intravenous contrast.  Multiplanar reconstructed images including MIPs were obtained and reviewed to evaluate the vascular anatomy.  Contrast: OMNIPAQUE IOHEXOL 350 MG/ML IV SOLN  Comparison:  CT chest 04/14/2011  CTA CHEST  Findings:  Unenhanced CT images of the chest and abdomen demonstrate  thoracic aortic ectasia with calcification.  Coronary artery calcification.  No evidence of intramural thrombus.  Tortuous and ectatic thoracic aorta.  Maximal AP diameter is about 3.5 cm in the ascending aorta.  No evidence of dissection.  No abnormal mediastinal or pleural fluid  collections.  No significant lymphadenopathy in the chest.  No pleural effusion.  A filling defect is demonstrated and anterior right upper lung pulmonary artery suggesting subsegmental pulmonary embolus.  No other pulmonary arterial filling defects are demonstrated.  Emphysematous changes and interstitial fibrosis in the lungs.  Atelectasis in the lung bases.  No pneumothorax.  Nodules seen previously in the superior segment of the left lower lung is not visualized today and may have been inflammatory. Enlarged nodular thyroid gland as previously demonstrated.   Review of the MIP images confirms the above findings.  IMPRESSION: Ectatic thoracic aorta without aneurysm or dissection.  Filling defect in a right upper lung pulmonary arteries suggesting subsegmental pulmonary embolus.  Emphysema and fibrosis in the lungs.  CTA ABDOMEN  Findings:  The study is abdominal and extends to the upper aspect of the iliac crest.  This does not include the entire aneurysm repair.  Distal aneurysm sac and repair remains indeterminate as it is not visualized.  There is an infrarenal abdominal aortic aneurysm post repair. Calcification of the native sac with thrombosis of the native sac and patency of the graft.  No retroperitoneal fluid collection or contrast extravasation.  Atherosclerotic changes in the upper abdominal aorta.  Vascular calcifications in the branch vessels. Superior mesenteric artery, celiac axis, and renal artery origins appear patent.  The inferior mesenteric artery is not demonstrated.Calcified granulomas in the spleen.  The liver parenchyma is homogeneous.  Cholelithiasis.  No bile duct dilatation.  The pancreas and adrenal glands are unremarkable. Suggestion of small stones versus vascular calcifications throughout the kidneys.  No evidence of obstruction.  Cyst in the right kidney measures about 3.3 cm diameter.  Visualized stomach, small bowel, and large bowel are decompressed.  No free air or free fluid in  the abdomen.  Degenerative changes in the thoracic and lumbar spine.   Review of the MIP images confirms the above findings.  IMPRESSION: Postoperative change with repair of abdominal aortic aneurysm. This is not completely included on the study.  No retroperitoneal fluid collection or contrast extravasation demonstrated in the visualized abdomen.  No free air or free fluid in the abdomen. Cholelithiasis.  Results discussed with Dr. Norlene Campbell at the time of dictation, [phone] hours on 06/08/2011.  Original Report Authenticated By: Marlon Pel, M.D.   Ir Ivc Filter Placement  06/08/2011  *RADIOLOGY REPORT*  Clinical data: Acute pulmonary embolism.  Severe anemia, a relative contraindication to anticoagulation.  INFERIOR VENACAVOGRAM IVC FILTER PLACEMENT UNDER FLUOROSCOPY  Technique and findings: Patency of the right IJ vein was confirmed with ultrasound with image documentation. An appropriate skin site was determined. Skin site was marked, prepped with chlorhexidine, and draped using maximum barrier technique. The region was infiltrated locally with 1% lidocaine. Under real-time ultrasound guidance, the right IJ vein was accessed with a 21 gauge micropuncture needle; the needle tip within the vein was confirmed with ultrasound image documentation.   The needle was exchanged over a 018 guidewire for a transitional dilator, which allow advancement of the [facility] wire into the IVC. A long 6 French vascular sheath was placed for inferior venacavography. This demonstrated no caval thrombus. Renal vein inflows were evident.  The Celect IVC filter was advanced through the sheath  and successfully deployed under fluoroscopy at the L3 level. Followup cavagram demonstrates stable filter position  and no evident complication. The sheath was removed and hemostasis achieved at the site. No immediate complication.  IMPRESSION: 1.  Normal IVC. No thrombus or significant anatomic variation. 2.  Technically successful infrarenal  IVC filter placement. This is a retrievable model.  Original Report Authenticated By: Osa Craver, M.D.        Assessment/Plan:  1. Hemolytic anemia:  This has not been present previously and etiology is not clear for this. Fortunately he has excellent reticulocyte response and hemoglobin is improving already. Would give prednisone 20 mg with food now then 55 mg (=1 mg/kg) daily beginning in am. Needs folate 1 mg bid for now. If improving further in am would decrease frequency of CBCs from present tid. No PRBCs unless more symptomatic.Follow up bili/LDH/haptoglobin with CBC . 2. Iron deficiency anemia: continue po iron bid now. Has had negative FOB previously but has not had endoscopy. Note blood in UA previously that has not been evaluated. 3. Thrombocytopenia in Sept. Possibly related to aneurysm then. Present count is stable. 4.Pulmonary embolus s/p IVC filter 5.COPD, long past tobacco. Known to [place] [facility] 6. Post AAA repair and iliac aneurysm repair   Discussed with nursing. Will follow. Please call between my rounds if needed. [name],LENNIS P 06/09/2011, 6:13 PM

## 2011-06-09 NOTE — Progress Notes (Signed)
Patient seen and examined states he is comfortable. He does have some minimal shortness of breath and mild chest wall pain on the left but appears resting. Vitals are stable.  Spoke to Hematologist on call Dr. Archie Balboa who states that Dr. Darnelle Catalan have seen patient earlier and reccommended Prednisone 1 mg/kg, and holding off on transfusion unless patient is significantly simptomatic. Will monitor Hg/hct q 8 h and will attempt to use pediatric tubes.   12:33 am Corrinne Benegas

## 2011-06-10 ENCOUNTER — Inpatient Hospital Stay (HOSPITAL_COMMUNITY): Payer: BC Managed Care – PPO

## 2011-06-10 ENCOUNTER — Encounter: Payer: Medicare Other | Admitting: Internal Medicine

## 2011-06-10 ENCOUNTER — Telehealth: Payer: Self-pay

## 2011-06-10 ENCOUNTER — Encounter (HOSPITAL_COMMUNITY)
Admission: EM | Disposition: A | Discharge status: Home / Self Care | Payer: Self-pay | Source: Home / Self Care | Attending: Internal Medicine

## 2011-06-10 DIAGNOSIS — R079 Chest pain, unspecified: Secondary | ICD-10-CM

## 2011-06-10 DIAGNOSIS — D649 Anemia, unspecified: Secondary | ICD-10-CM

## 2011-06-10 DIAGNOSIS — J9600 Acute respiratory failure, unspecified whether with hypoxia or hypercapnia: Secondary | ICD-10-CM

## 2011-06-10 DIAGNOSIS — I2699 Other pulmonary embolism without acute cor pulmonale: Secondary | ICD-10-CM

## 2011-06-10 LAB — CBC
HCT: 15.9 % — ABNORMAL LOW (ref 39.0–52.0)
Hemoglobin: 5.1 g/dL — CL (ref 13.0–17.0)
MCH: 35.2 pg — ABNORMAL HIGH (ref 26.0–34.0)
MCHC: 31 g/dL (ref 30.0–36.0)
MCV: 113.6 fL — ABNORMAL HIGH (ref 78.0–100.0)
Platelets: 130 10*3/uL — ABNORMAL LOW (ref 150–400)
RBC: 1.39 MIL/uL — ABNORMAL LOW (ref 4.22–5.81)
WBC: 12.3 10*3/uL — ABNORMAL HIGH (ref 4.0–10.5)
WBC: 14.2 10*3/uL — ABNORMAL HIGH (ref 4.0–10.5)

## 2011-06-10 LAB — GLUCOSE, CAPILLARY

## 2011-06-10 LAB — RETICULOCYTES
RBC.: 1.29 MIL/uL — ABNORMAL LOW (ref 4.22–5.81)
Retic Count, Absolute: 505.7 10*3/uL — ABNORMAL HIGH (ref 19.0–186.0)
Retic Ct Pct: 39.2 % — ABNORMAL HIGH (ref 0.4–3.1)

## 2011-06-10 LAB — DIRECT ANTIGLOBULIN TEST (NOT AT ARMC): DAT, complement: NEGATIVE

## 2011-06-10 SURGERY — EGD (ESOPHAGOGASTRODUODENOSCOPY)
Anesthesia: Moderate Sedation

## 2011-06-10 MED ORDER — ENSURE IMMUNE HEALTH PO LIQD
237.0000 mL | Freq: Three times a day (TID) | ORAL | Status: DC
  Administered 2011-06-10: 237 mL via ORAL
  Administered 2011-06-11: 11:00:00 via ORAL
  Administered 2011-06-11 (×2): 237 mL via ORAL
  Administered 2011-06-12: 08:00:00 via ORAL
  Administered 2011-06-12 – 2011-06-13 (×5): 237 mL via ORAL
  Administered 2011-06-14 – 2011-06-15 (×2): via ORAL

## 2011-06-10 MED ORDER — DIPHENHYDRAMINE HCL 50 MG/ML IJ SOLN
25.0000 mg | Freq: Four times a day (QID) | INTRAMUSCULAR | Status: DC | PRN
  Administered 2011-06-10 – 2011-06-11 (×2): 25 mg via INTRAVENOUS
  Filled 2011-06-10 (×2): qty 1

## 2011-06-10 MED ORDER — METHYLPREDNISOLONE SODIUM SUCC 40 MG IJ SOLR
30.0000 mg | Freq: Three times a day (TID) | INTRAMUSCULAR | Status: DC
  Administered 2011-06-11 – 2011-06-12 (×4): 30 mg via INTRAVENOUS
  Filled 2011-06-10 (×7): qty 0.75

## 2011-06-10 MED ORDER — CLOTRIMAZOLE 10 MG MT TROC
10.0000 mg | Freq: Three times a day (TID) | OROMUCOSAL | Status: DC
  Administered 2011-06-10 – 2011-06-15 (×16): 10 mg via ORAL
  Filled 2011-06-10 (×18): qty 1

## 2011-06-10 MED ORDER — METHYLPREDNISOLONE SODIUM SUCC 40 MG IJ SOLR
30.0000 mg | Freq: Three times a day (TID) | INTRAMUSCULAR | Status: DC
  Administered 2011-06-10: 30 mg via INTRAVENOUS
  Filled 2011-06-10 (×4): qty 0.75

## 2011-06-10 MED ORDER — SODIUM CHLORIDE 0.9 % IV SOLN
1020.0000 mg | Freq: Once | INTRAVENOUS | Status: AC
  Administered 2011-06-10: 1020 mg via INTRAVENOUS
  Filled 2011-06-10: qty 34

## 2011-06-10 NOTE — Progress Notes (Signed)
Patient ID: [name], male   DOB: [birth year], 76 y.o.   MRN: [id number] Subjective: No events overnight. Patient denies chest pain, shortness of breath, abdominal pain.   Objective:  Vital signs in last 24 hours:  Filed Vitals:   06/10/11 0446 06/10/11 0800 06/10/11 1140 06/10/11 1200  BP: 125/69 111/64  122/65  Pulse: 87 59 67 61  Temp: 97.7 F (36.5 C) 98.1 F (36.7 C)  98.3 F (36.8 C)  TempSrc: Oral Oral  Oral  Resp: 22 15  23   Height:      Weight: 55.8 kg (123 lb 0.3 oz)     SpO2: 97% 100%  100%    Intake/Output from previous day:   Intake/Output Summary (Last 24 hours) at 06/10/11 1623 Last data filed at 06/10/11 1100  Gross per 24 hour  Intake   1190 ml  Output    725 ml  Net    465 ml    Physical Exam: General: Alert, awake, oriented x3, in no acute distress. HEENT: No bruits, no goiter. Moist mucous membranes, no scleral icterus, no conjunctival pallor. Heart: Regular rate and rhythm, without murmurs, rubs, gallops. Lungs: Clear to auscultation bilaterally. No wheezing, no rhonchi, no rales.  Abdomen: Soft, nontender, nondistended, positive bowel sounds. Extremities: No clubbing cyanosis or edema,  positive pedal pulses. Neuro: Grossly intact, nonfocal.    Lab Results:  Basic Metabolic Panel:    Component Value Date/Time   NA 139 06/09/2011 1836   K 4.0 06/09/2011 1836   CL 110 06/09/2011 1836   CO2 18* 06/09/2011 1836   BUN 22 06/09/2011 1836   CREATININE 1.24 06/09/2011 1836   GLUCOSE 159* 06/09/2011 1836   CALCIUM 8.7 06/09/2011 1836   CBC:    Component Value Date/Time   WBC 14.2* 06/10/2011 0018   WBC 9.7 05/26/2011 0851   HGB 4.4* 06/10/2011 0018   HGB 10.3* 05/26/2011 0851   HCT 14.2* 06/10/2011 0018   HCT 30.7* 05/26/2011 0851   PLT 130* 06/10/2011 0018   PLT 140 05/26/2011 0851   MCV 113.6* 06/10/2011 0018   MCV 81.9 05/26/2011 0851   NEUTROABS 8.0* 06/08/2011 0321   NEUTROABS 4.9 05/26/2011 0851   LYMPHSABS 1.9 06/08/2011  0321   LYMPHSABS 3.5* 05/26/2011 0851   MONOABS 0.8 06/08/2011 0321   MONOABS 1.0* 05/26/2011 0851   EOSABS 0.0 06/08/2011 0321   EOSABS 0.1 05/26/2011 0851   BASOSABS 0.0 06/08/2011 0321   BASOSABS 0.1 05/26/2011 0851      Lab 06/10/11 0018 06/09/11 1521 06/09/11 1119 06/09/11 0520 06/08/11 2325 06/08/11 0321  WBC 14.2* 19.9* 15.7* 13.1* -- 10.7*  HGB 4.4* 5.1* 4.8* 4.3* 4.4* --  HCT 14.2* 16.9* 15.2* 13.8* 14.2* --  PLT 130* 139* 155 PLATELET CLUMPS NOTED ON SMEAR, UNABLE TO ESTIMATE -- 188  MCV 113.6* 114.2* 113.4* 112.2* -- 108.9*  MCH 35.2* 34.5* 35.8* 35.0* -- 35.4*  MCHC 31.0 30.2 31.6 31.2 -- 32.6  RDW NOT CALCULATED NOT CALCULATED NOT CALCULATED NOT CALCULATED -- 32.2*  LYMPHSABS -- -- -- -- -- 1.9  MONOABS -- -- -- -- -- 0.8  EOSABS -- -- -- -- -- 0.0  BASOSABS -- -- -- -- -- 0.0  BANDABS -- -- -- -- -- --    Lab 06/09/11 1836 06/09/11 0520 06/08/11 0321  NA 139 140 137  K 4.0 3.6 3.2*  CL 110 109 100  CO2 18* 19 19  GLUCOSE 159* 107* 147*  BUN 22 17 21   CREATININE 1.24  1.21 1.38*  CALCIUM 8.7 8.5 9.5  MG -- -- --    Lab 06/08/11 0813 06/08/11 0455  INR 1.23 1.20  PROTIME -- --   Cardiac markers: No results found for this basename: CK:3,CKMB:3,TROPONINI:3,MYOGLOBIN:3 in the last 168 hours No components found with this basename: POCBNP:3 Recent Results (from the past 240 hour(s))  TECHNOLOGIST SMEAR REVIEW     Status: Normal   Collection Time   06/08/11  8:13 AM      Component Value Range Status Comment   Path Review MARKED POLYCHROMASIA   Final   MRSA PCR SCREENING     Status: Normal   Collection Time   06/08/11  7:13 PM      Component Value Range Status Comment   MRSA by PCR NEGATIVE  NEGATIVE  Final     Studies/Results: Dg Chest Port 1v Same Day  06/10/2011   IMPRESSION:  1.  Changes of COPD and pulmonary venous congestion. 2.  Bibasilar atelectasis.     Medications: Scheduled Meds:   . sodium chloride   Intravenous Once  . clotrimazole   10 mg Oral TID  . dextromethorphan-guaiFENesin  1-2 tablet Oral Q12H  . digoxin  125 mcg Oral Daily  . feeding supplement  237 mL Oral TID WC  . ferumoxytol  1,020 mg Intravenous Once  . folic acid  1 mg Oral BID  . furosemide  10 mg Intravenous Once  . LORazepam  1 mg Oral Once  . methylPREDNISolone (SOLU-MEDROL) injection  30 mg Intravenous Q8H  . predniSONE  20 mg Oral Once  . predniSONE  55 mg Oral Q breakfast  . DISCONTD: ferrous fumarate  1 tablet Oral BID  . DISCONTD: ferrous sulfate  325 mg Oral Q breakfast   Continuous Infusions:   . sodium chloride 50 mL/hr at 06/08/11 2348  . sodium chloride 20 mL/hr at 06/09/11 2023  . pantoprozole (PROTONIX) infusion 8 mg/hr (06/10/11 1309)   PRN Meds:.albuterol, diphenhydrAMINE, guaiFENesin-dextromethorphan, HYDROcodone-acetaminophen, ondansetron (ZOFRAN) IV, ondansetron, DISCONTD: diphenhydrAMINE  Assessment/Plan:  Principal Problem:  *Pulmonary embolism - patient is status post IVC filter placement; as per pulmonary patient will need lifelong anticoagulation with Coumadin however at this time patient is too unstable in terms of hemoglobin level for the anticoagulation.  Active Problems:  Hypokalemia - resolved   Warm antibody hemolytic anemia - and will be transfused initially 2 units of PRBC phenotypically matched; prednisone 55 mg daily patient will be started on Solu-Medrol 30 mg every 8 hours IV as prior to transfusion, patient will also receive Benadryl 25 mg IV first dose prior to transfusion and then every 6 hours as needed to prevent any reaction; she will be started on high-dose steroids we will monitor blood glucose; allergies following    LOS: 2 days   [name] 06/10/2011, 4:23 PM

## 2011-06-10 NOTE — Progress Notes (Signed)
  Subjective:    Patient ID: [name], male    DOB: 01-16-1935, 76 y.o.   MRN: [id number]  HPI  Vascular surgery [name] uses [name] for primary care Pancytopenia f/u [name]  76 yobm quit smoking at AAA surgery 03/30/11 with no problems prior to surgery and atx R base post op eval by Molli Knock and referred to pulmonary clinic for f/u   05/06/2011 Initial pulmonary office eval in EMR era cc minimal cough, no limits with breathing though not fully back to nl activities,  Rarely using inhalers.  Sleeping ok without nocturnal  or early am exacerbation  of respiratory  c/o's or need for noct saba. Also denies any obvious fluctuation of symptoms with weather or environmental changes or other aggravating or alleviating factors except as outlined above.  05/31/2011 Follow up / NP Pt presents for follow up of bronchitis flare. Had prod cough with white mucus x5days. Seen by Dr. Darrold Span on  11.28.12 and was given levaquin 500mg   mucinex, diflucan for thrush He is feeling much better. Still has some cough but only clear to white mucus Has ov next week with Dr. Sherene Sires  With PFTs  No hemoptysis or fever.  rec Finish Levaquin  Mucinex DM Twice daily  As needed  Cough/congestion  follow up Dr. Sherene Sires  As planned next week for lung tests           Objective:   Physical Exam Thin amb bm nad with mod congested sounding cough Wt 122 05/06/2011 >>123 05/31/2011  > 06/10/2011  HEENT mild turbinate edema.  Oropharynx no thrush or excess pnd or cobblestoning.  No JVD or cervical adenopathy. Mild accessory muscle hypertrophy. Trachea midline, nl thryroid. Chest was hyperinflated by percussion with diminished breath sounds  . Regular rate and rhythm without murmur gallop or rub or increase P2 or edema.  Abd: no hsm, nl excursion. Ext warm without cyanosis     CXR  05/06/2011 :  Improvement in aeration right base with minimal residual atelectasis or scarring. No new infiltrate noted. No  pulmonary edema.      Assessment & Plan:

## 2011-06-10 NOTE — Progress Notes (Signed)
INITIAL ADULT NUTRITION ASSESSMENT Date: 06/10/2011   Time: 12:06 PM  Reason for Assessment: Nutrition Risk Report (Appears severely malnourished)  ASSESSMENT: Male 76 y.o.  Dx: Pulmonary embolism  Hx:  Past Medical History  Diagnosis Date  . AAA (abdominal aortic aneurysm)   . Thrombocytopenia   . Abdominal aneurysm without mention of rupture 04/23/2011  . Peripheral arterial disease 04/23/2011  . COPD (chronic obstructive pulmonary disease) 05/06/2011  . Shortness of breath   . Anemia   . Iron deficiency anemia 05/26/2011    Related Meds:  Scheduled Meds:   . sodium chloride   Intravenous Once  . clotrimazole  10 mg Oral TID  . dextromethorphan-guaiFENesin  1-2 tablet Oral Q12H  . digoxin  125 mcg Oral Daily  . ferumoxytol  1,020 mg Intravenous Once  . folic acid  1 mg Oral BID  . furosemide  10 mg Intravenous Once  . LORazepam  1 mg Oral Once  . predniSONE  20 mg Oral Once  . predniSONE  55 mg Oral Q breakfast  . DISCONTD: ferrous fumarate  1 tablet Oral BID  . DISCONTD: ferrous sulfate  325 mg Oral Q breakfast   Continuous Infusions:   . sodium chloride 50 mL/hr at 06/08/11 2348  . sodium chloride 20 mL/hr at 06/09/11 2023  . pantoprozole (PROTONIX) infusion 8 mg/hr (06/10/11 1000)   PRN Meds:.albuterol, diphenhydrAMINE, guaiFENesin-dextromethorphan, HYDROcodone-acetaminophen, ondansetron (ZOFRAN) IV, ondansetron   Ht: 5\' 11"  (180.3 cm)  Wt: 123 lb 0.3 oz (55.8 kg)  Ideal Wt: 78.2 kg % Ideal Wt: 71%  Usual Wt: 150-155 lb % Usual Wt: 79%  Body mass index is 17.16 kg/(m^2).  Food/Nutrition Related Hx: weight loss of around 30 lb since July 2012; minimal intake after AAA repair a few months ago; since admission has been eating well-consumed 100% of breakfast today.  Likes Ensure.  Labs:  CMP     Component Value Date/Time   NA 139 06/09/2011 1836   K 4.0 06/09/2011 1836   CL 110 06/09/2011 1836   CO2 18* 06/09/2011 1836   GLUCOSE 159* 06/09/2011  1836   BUN 22 06/09/2011 1836   CREATININE 1.24 06/09/2011 1836   CALCIUM 8.7 06/09/2011 1836   PROT 6.9 06/09/2011 1836   ALBUMIN 2.5* 06/09/2011 1836   AST 19 06/09/2011 1836   ALT 10 06/09/2011 1836   ALKPHOS 79 06/09/2011 1836   BILITOT 1.5* 06/09/2011 1836   GFRNONAA 56* 06/09/2011 1836   GFRAA 65* 06/09/2011 1836    Intake/Output Summary (Last 24 hours) at 06/10/11 1208 Last data filed at 06/10/11 1100  Gross per 24 hour  Intake   1390 ml  Output    650 ml  Net    740 ml    Diet Order: Regular  IVF:    sodium chloride Last Rate: 50 mL/hr at 06/08/11 2348  sodium chloride Last Rate: 20 mL/hr at 06/09/11 2023  pantoprozole (PROTONIX) infusion Last Rate: 8 mg/hr (06/10/11 1000)    Estimated Nutritional Needs:   Kcal: 1800-2000 Protein: 90-100 grams Fluid: 1.8-2 liters  NUTRITION DIAGNOSIS: -Malnutrition (NI-5.2).  Status: Ongoing  RELATED TO: suboptimal oral intake PTA  AS EVIDENCED BY: 21% weight loss in 5 months, BMI=17.16  MONITORING/EVALUATION(Goals): Adequate intake of meals and supplements to meet nutrition needs for repletion of nutrition stores. Monitor PO intake, labs, weight trend.  EDUCATION NEEDS: -Education needs addressed; discussed ways to increase kcal and protein intake.  INTERVENTION: Ensure TID with meals to increase kcal and protein provision  to support gradual increase in lean body mass.  Dietitian #:  [phone]  DOCUMENTATION CODES Per approved criteria  -Severe malnutrition in the context of acute illness or injury    Hettie Holstein 06/10/2011, 12:06 PM

## 2011-06-10 NOTE — Progress Notes (Signed)
[name] 10:34 AM  Subjective: The patient is not having any GI complaints in fact is doing quite well and is tolerating regular diet and has no signs of active bleeding  Objective: Vital signs stable afebrile abdomen is soft nontender hemoglobin still in the 4 range  Assessment: Currently stable  Plan: Happy to proceed with GI workup however preferred to proceed when hemoglobin a bit higher and I discussed that with the patient and his wife and I gave them my card and will be happy to set him up for an outpatient visit when he is stable from a hematologic and pulmonary standpoint please call us back if we can be of any further assistance during this hospital stay or call if any further question or problem [facility]

## 2011-06-10 NOTE — Progress Notes (Signed)
Name: [name] MRN: [id number] DOB: May 12, 1935    LOS: 2 PCCM FOLLOW-UP PROGRESS NOTE  Consulting MD Thedore Mins  Reason for consultation: Pulmonary emboli in setting of profound anemia  SUBJ: Has been seen by Heme - newly diagnosed hemolytic anemia. No new complaints   Vital Signs: Temp:  [97.6 F (36.4 C)-98.1 F (36.7 C)] 98.1 F (36.7 C) (12/13 0800) Pulse Rate:  [20-87] 59  (12/13 0800) Resp:  [15-22] 15  (12/13 0800) BP: (111-125)/(59-79) 111/64 mmHg (12/13 0800) SpO2:  [97 %-100 %] 100 % (12/13 0800) Weight:  [55.8 kg (123 lb 0.3 oz)] 123 lb 0.3 oz (55.8 kg) (12/13 0446) I/O last 3 completed shifts: In: 2340 [P.O.:400; I.V.:1940] Out: 1495 [Urine:1495]  Physical Examination: General: frail 76 year old male in no acute distress.  Neuro:  Alert and oriented.  HEENT: sclera are pale, as are mucous membranes.  Cardiovascular:  RRR Lungs: scattered rhonchi Abdomen:  Soft, non-tender Musculoskeletal:  No swelling, brisk CR Skin:  intact  Labs and Imaging:    Lab 06/09/11 1836 06/09/11 0520 06/08/11 0321  NA 139 140 137  K 4.0 3.6 3.2*  CL 110 109 100  CO2 18* 19 19  BUN 22 17 21   CREATININE 1.24 1.21 1.38*  GLUCOSE 159* 107* 147*    Lab 06/10/11 0018 06/09/11 1521 06/09/11 1119  HGB 4.4* 5.1* 4.8*  HCT 14.2* 16.9* 15.2*  WBC 14.2* 19.9* 15.7*  PLT 130* 139* 155    Assessment and Plan: Pulmonary embolism . S/P IVC filter,  Lower extremity dopplers reveal LLE DVT -It would be preferable to anticoagulate when safe and feasible to do so. Would like to see a little more "margin for error" before initiating heparin or warfarin. Therefore would continue to hold all anticoagulation until we are confident that the hemolysis is well controlled. The IVC filter will give him adequate protection against recurrent PE for several weeks or more. When deemed safe to initiate by Heme-Onc, would start warfarin and titrate to maintain INR 2.0 - 2.5 range - this will likely be  as an outpt. He probably should remain on warfarin lifelong once it is started as long as he suffers no adverse complications  COPD (chronic obstructive pulmonary disease) Plan: -Cont PRN bds  Hemolytic Anemia. Mgmt per Heme-Onc   PCCM will sign off. Please call if we can be of further assistance  Billy Fischer 06/10/2011, 11:08 AM

## 2011-06-10 NOTE — Progress Notes (Signed)
  06/10/2011, 9:00 AM  Hospital day: 3 Antibiotics: none      Day 2 steroids    Subjective: No acute problems overnight per nursing; wife here now. Patient rouses quickly to gentle exam, alert and responds appropriately. Denies pain including right lateral chest. Denies SOB with O2 on, some cough nonproductive now. Is hungry. No oral mucositis symptoms. No noted bleeding.Had prednisone and oral folate last pm. No puritis. Objective: Vital signs in last 24 hours: Blood pressure 111/64, pulse 59, temperature 98.1 F (36.7 C), temperature source Oral, resp. rate 15, height 5\' 11"  (1.803 m), weight 123 lb 0.3 oz (55.8 kg), SpO2 100.00%.   Intake/Output from previous day: 12/12 0701 - 12/13 0700 In: 1740 [Holmes.O.:400; I.V.:1340] Out: 1025 [Urine:1025] Intake/Output this shift:    Physical exam: Looks comfortable lying almost flat in recliner with [place] O2. Speech fluent and appropriate, cooperative. Oral mucosa moist without lesions. Cor RRR. Lungs with coarse crackles lower fields bilaterally now present last pm. IV sites ok, no apparent bleeding. Abd soft, NT, some BS. LE no edema/cords/tenderness. Moves all extremities. Nailbeds pale. Slight icterus.  Lab Results:  [facility] 06/10/11 0018 06/09/11 1521  WBC 14.2* 19.9*  HGB 4.4* 5.1*  HCT 14.2* 16.9*  PLT 130* 139*   BMET  Basename 06/09/11 1836 06/09/11 0520  NA 139 140  K 4.0 3.6  CL 110 109  CO2 18* 19  GLUCOSE 159* 107*  BUN 22 17  CREATININE 1.24 1.21  CALCIUM 8.7 8.5   DAT + IgG.    Reticulocyte count and LDH added to labs already drawn and pending.  Haptoglobin in process UA negative for blood Studies/Results: CXR portable ordered now and pending   Discussed IV iron preparations with [facility] and [facility] pharmacists; discussed IV iron administration with wife and patient now. He is still significantly iron deficient despite oral iron (intermittently) since Sept, which is not helping RBC production with this new hemolytic  anemia. Will give feraheme as 1 time dose, given in ~ 100cc NS over 15 min. Discussed hemolytic anemia again with wife. Decrease in Hgb this am is likely from ongoing hemolysis/ may be lower RBC production with lack of iron/ also I>O by 760 cc charted and multiple blood draws (tho using peds tubes).   Assessment/Plan: 1.Hemolytic anemia, new onset:  Continue prednisone 1mg /kg daily, continue folate 1 mg bid, limit blood draws as possible (I have changed CBCs to bid now from tid). May need IVIG if no quick improvement. Could carefully transfuse if symptomatic only. CBC bid and retic/LDH daily for now 2.Iron deficiency anemia: will give IV feraheme as single dose this am. Could DC po iron if preferred, tho will not hurt to continue for now. Would hold on endoscopy with Hgb 4.4 today, as no overt GI bleeding now. 3.Thrombocytopenia in Sept thought related to aneurysms 4.Some respiratory congestion now, underlying COPD and previous respiratory complications with surgery/ recent bronchitis. CXR. Long past tobacco 5.I>O with IVF NS at 20cc/hr only and on lasix 6. AAA and iliac aneurysm repair Oct.  I will see again in AM; please call our MD on call [phone]) if we can be of help prior to that. [name],[name]

## 2011-06-10 NOTE — Telephone Encounter (Signed)
Received call from Dr. Luisa Hart at [facility] stating that pt went to ER Mon or Tue and was admitted with hgb 5.  He states pt has been previously transfused, but has a warm autoimmune antibody which complicates cross-matching, and pt's with this are usually given least incompatible blood.  He states a sample from the pt was sent to the [facility] for phenotyping, and they did receive 5 units that are phenotypically matched for the pt, which they are not certain are compatible, but they will expire tomorrow night.  He states he believes one of his techs got an order from this office maybe for the pt not to be transfused unless there was a code, but he would rather the blood be used sooner rather than later.  He wanted to get Dr. Precious Reel thoughts, pt is currently inpt at [facility].  Informed him that Dr. Darrold Span is out of the office, and he states he will try to find out more information.  Called back to [facility] and informed them to give Dr. Luisa Hart the message that pt had been seen here last on 05/26/11, and if he needs urgent attention to consult with on-call MD, Dr. Arline Asp, but note will be left for Dr. Darrold Span.

## 2011-06-10 NOTE — Progress Notes (Signed)
Speech Language Pathology Clinical/Bedside Swallow Evaluation Patient Details  Name: Geron Mulford MRN: [id number] DOB: 04/10/1935 Today's Date: 06/10/2011  Past Medical History:  Past Medical History  Diagnosis Date  . AAA (abdominal aortic aneurysm)   . Thrombocytopenia   . Abdominal aneurysm without mention of rupture 04/23/2011  . Peripheral arterial disease 04/23/2011  . COPD (chronic obstructive pulmonary disease) 05/06/2011  . Shortness of breath   . Anemia   . Iron deficiency anemia 05/26/2011   HPI 76 y.o. male admitted 06/08/11 with right flank pain due to a PE, s/p IVC filter with h/o COPD.  Swallow evaluation ordered and acknowledged on 12/12, however pt. was NPO for several procedures.  Since this time, physician initated a Regular/Thin liquid diet .  Patient, his wife and RN all stated no overt s/s of aspiration.  Assessment/Recommendations/Treatment Plan Clinical Impression Statement: Demonstrates a functional oral-pharyngeal swallow without any clinical indications of aspiration or a dysphagia.  Continue with current diet. Risk for Aspiration: None  Recommendations 1. Regular & Thin liquids 2. Whole meds with liquid 3. D/C SLP services  Myra Rude, M.S.,CCC-SLP Pager [phone]

## 2011-06-11 ENCOUNTER — Other Ambulatory Visit: Payer: Self-pay

## 2011-06-11 LAB — HAPTOGLOBIN: Haptoglobin: 124 mg/dL (ref 30–200)

## 2011-06-11 LAB — LACTATE DEHYDROGENASE: LDH: 258 U/L — ABNORMAL HIGH (ref 94–250)

## 2011-06-11 LAB — CBC
HCT: 23.4 % — ABNORMAL LOW (ref 39.0–52.0)
Hemoglobin: 7.5 g/dL — ABNORMAL LOW (ref 13.0–17.0)
Hemoglobin: 9.3 g/dL — ABNORMAL LOW (ref 13.0–17.0)
MCH: 31.9 pg (ref 26.0–34.0)
Platelets: 122 10*3/uL — ABNORMAL LOW (ref 150–400)
RBC: 2.35 MIL/uL — ABNORMAL LOW (ref 4.22–5.81)
RBC: 2.97 MIL/uL — ABNORMAL LOW (ref 4.22–5.81)
WBC: 12.6 10*3/uL — ABNORMAL HIGH (ref 4.0–10.5)

## 2011-06-11 LAB — GLUCOSE, CAPILLARY
Glucose-Capillary: 163 mg/dL — ABNORMAL HIGH (ref 70–99)
Glucose-Capillary: 209 mg/dL — ABNORMAL HIGH (ref 70–99)
Glucose-Capillary: 136 mg/dL — ABNORMAL HIGH (ref 70–99)

## 2011-06-11 LAB — RETICULOCYTES
Retic Count, Absolute: 383.1 10*3/uL — ABNORMAL HIGH (ref 19.0–186.0)
Retic Ct Pct: 16.3 % — ABNORMAL HIGH (ref 0.4–3.1)

## 2011-06-11 NOTE — Progress Notes (Signed)
  06/11/2011, 8:06 AM  Hospital day: 4 Antibiotics: none   Day 3 steroids    Subjective: Rouses easily to voice, answers appropriately. Denies pain or SOB now, with O2 on. Denies cough this am. Slept very little with PRBCs given thru night, but tolerated without difficulty (these units available yesterday from [employer] matching). Wife not here presently.  Objective: Vital signs in last 24 hours: Blood pressure 163/79, pulse 50, temperature 97.9 F (36.6 C), temperature source Oral, resp. rate 14, height 5\' 11"  (1.803 m), weight 133 lb 2.5 oz (60.4 kg), SpO2 99.00%. PERRL, slightly icteric. Mouth clear and moist. Resp unlabored at 30 degrees on [place]. Lungs without wheezes or rales anteriorly. Cor RRR. Abd + BS, soft and nontender. LE no edema/cords/tenderness. No noted bleeding   Intake/Output from previous day: 12/13 0701 - 12/14 0700 In: 2977.1 [I.V.:2302.1; Blood:675] Out: 450 [Urine:450] Intake/Output this shift:    Lab Results:  Basename 06/10/11 1855 06/10/11 0018  WBC 12.3* 14.2*  HGB 5.1* 4.4*  HCT 15.9* 14.2*  PLT 142* 130*  CBC pending this am BMET  Basename 06/09/11 1836 06/09/11 0520  NA 139 140  K 4.0 3.6  CL 110 109  CO2 18* 19  GLUCOSE 159* 107*  BUN 22 17  CREATININE 1.24 1.21  CALCIUM 8.7 8.5    Studies/Results: Dg Chest Port 1v Same Day  06/10/2011  *RADIOLOGY REPORT*  Clinical Data: Cough and COPD  PORTABLE CHEST - 1 VIEW SAME DAY  Comparison: 06/08/2011  Findings: Heart size appears normal.  There are coarsened interstitial markings identified bilaterally.  Pulmonary venous congestion is noted.  No interstitial edema or pleural effusion identified.  Bibasilar atelectasis present with the lung bases.  IMPRESSION:  1.  Changes of COPD and pulmonary venous congestion. 2.  Bibasilar atelectasis.  Original Report Authenticated By: Rosealee Albee, M.D.     Assessment/Plan: 1. Hemolytic anemia: etiology not clear. Continuing prednisone 1 mg/kg and  folate. Also transfused 2 units least incompatible PRBCs last pm per primary service. Follow up CBC pending as well as retics and LDH. Not stable for DC. 2.Iron deficiency anemia: post IV feraheme full dose yesterday without difficulty. Expect GI workup when stable from #1 (see #3). COntinue H2 blocker and mycelex. 3. PE on admission, with IVC filter in. Agree that he is not yet stable for anticoagulation and would suggest endoscopies be done before anticoagulation started. 4.long tobacco with COPD. CXR noted 5.Repair of aneurysms in Oct 2012  I will ask our on call MD to see this weekend. Please call between our rounds if needed   [phone]   Marshae Azam P

## 2011-06-11 NOTE — Progress Notes (Signed)
Called by Nurse secondary to one episode of HR 38.  HR has been in the high 50's to 60's.  Incident occurred when pt asleep and his HR normalized immediately upon awakening. 12 Lead EKG showed sinus brady with rate of 58. Pt denies CP, SOB, pain, HA, n/v. S1S2 without MRG.  Occasional PVC noted on physical exam and on telemetry.  Pt is alert and oriented, laughing and appropriate. He is on 125 Dig. Will see if attending wants to decrease dose. Not on BB's. He is s/p 2 U PRBC and tolerated that well. 0800 CBC check due.

## 2011-06-11 NOTE — Progress Notes (Signed)
Patient tolerated 2 units of PRBC's.   VSS; spoke with Maren Reamer, NP; told to hold off on next two units for now and CBC in one hour.  Will continue to monitor.   Van Clines 06/11/2011 04:30 A.M.

## 2011-06-11 NOTE — Progress Notes (Signed)
Patient ID: [name], male   DOB: [birth year], 76 y.o.   MRN: [id number] Subjective: No events overnight. Patient denies chest pain, shortness of breath, abdominal pain. Patient reports feeling much better today, looks more energetic and responds with enthusiasm.  Objective:  Vital signs in last 24 hours:  Filed Vitals:   06/11/11 1300 06/11/11 1315 06/11/11 1415 06/11/11 1515  BP: 161/85 172/96 157/99 131/73  Pulse: 78 63 65 52  Temp:  97.5 F (36.4 C) 98.2 F (36.8 C) 98.8 F (37.1 C)  TempSrc:  Oral Oral Oral  Resp: 23 20 21 16   Height:      Weight:      SpO2:        Intake/Output from previous day:   Intake/Output Summary (Last 24 hours) at 06/11/11 1607 Last data filed at 06/11/11 1400  Gross per 24 hour  Intake 3157.08 ml  Output    825 ml  Net 2332.08 ml    Physical Exam: General: Alert, awake, oriented x3, in no acute distress. HEENT: No bruits, no goiter. Moist mucous membranes, no scleral icterus, no conjunctival pallor. Heart: Regular rate and rhythm, without murmurs, rubs, gallops. Lungs: Clear to auscultation bilaterally. No wheezing, no rhonchi, no rales.  Abdomen: Soft, nontender, nondistended, positive bowel sounds. Extremities: No clubbing cyanosis or edema,  positive pedal pulses. Neuro: Grossly intact, nonfocal.    Lab Results:  Basic Metabolic Panel:    Component Value Date/Time   NA 139 06/09/2011 1836   K 4.0 06/09/2011 1836   CL 110 06/09/2011 1836   CO2 18* 06/09/2011 1836   BUN 22 06/09/2011 1836   CREATININE 1.24 06/09/2011 1836   GLUCOSE 159* 06/09/2011 1836   CALCIUM 8.7 06/09/2011 1836   CBC:    Component Value Date/Time   WBC 12.2* 06/11/2011 0838   WBC 9.7 05/26/2011 0851   HGB 7.5* 06/11/2011 0838   HGB 10.3* 05/26/2011 0851   HCT 23.4* 06/11/2011 0838   HCT 30.7* 05/26/2011 0851   PLT 126* 06/11/2011 0838   PLT 140 05/26/2011 0851   MCV 99.6 06/11/2011 0838   MCV 81.9 05/26/2011 0851   NEUTROABS 8.0* 06/08/2011  0321   NEUTROABS 4.9 05/26/2011 0851   LYMPHSABS 1.9 06/08/2011 0321   LYMPHSABS 3.5* 05/26/2011 0851   MONOABS 0.8 06/08/2011 0321   MONOABS 1.0* 05/26/2011 0851   EOSABS 0.0 06/08/2011 0321   EOSABS 0.1 05/26/2011 0851   BASOSABS 0.0 06/08/2011 0321   BASOSABS 0.1 05/26/2011 0851      Lab 06/11/11 0838 06/10/11 1855 06/10/11 0018 06/09/11 1521 06/09/11 1119 06/08/11 0321  WBC 12.2* 12.3* 14.2* 19.9* 15.7* --  HGB 7.5* 5.1* 4.4* 5.1* 4.8* --  HCT 23.4* 15.9* 14.2* 16.9* 15.2* --  PLT 126* 142* 130* 139* 155 --  MCV 99.6 114.4* 113.6* 114.2* 113.4* --  MCH 31.9 36.7* 35.2* 34.5* 35.8* --  MCHC 32.1 32.1 31.0 30.2 31.6 --  RDW 28.5* 25.5* NOT CALCULATED NOT CALCULATED NOT CALCULATED --  LYMPHSABS -- -- -- -- -- 1.9  MONOABS -- -- -- -- -- 0.8  EOSABS -- -- -- -- -- 0.0  BASOSABS -- -- -- -- -- 0.0  BANDABS -- -- -- -- -- --    Lab 06/09/11 1836 06/09/11 0520 06/08/11 0321  NA 139 140 137  K 4.0 3.6 3.2*  CL 110 109 100  CO2 18* 19 19  GLUCOSE 159* 107* 147*  BUN 22 17 21   CREATININE 1.24 1.21 1.38*  CALCIUM 8.7 8.5  9.5  MG -- -- --    Lab 06/08/11 0813 06/08/11 0455  INR 1.23 1.20  PROTIME -- --   Cardiac markers: No results found for this basename: CK:3,CKMB:3,TROPONINI:3,MYOGLOBIN:3 in the last 168 hours No components found with this basename: POCBNP:3 Recent Results (from the past 240 hour(s))  TECHNOLOGIST SMEAR REVIEW     Status: Normal   Collection Time   06/08/11  8:13 AM      Component Value Range Status Comment   Path Review MARKED POLYCHROMASIA   Final   MRSA PCR SCREENING     Status: Normal   Collection Time   06/08/11  7:13 PM      Component Value Range Status Comment   MRSA by PCR NEGATIVE  NEGATIVE  Final     Studies/Results: Dg Chest Port 1v Same Day  06/10/2011   IMPRESSION:  1.  Changes of COPD and pulmonary venous congestion. 2.  Bibasilar atelectasis.     Medications: Scheduled Meds:   . sodium chloride   Intravenous Once  .  clotrimazole  10 mg Oral TID  . dextromethorphan-guaiFENesin  1-2 tablet Oral Q12H  . digoxin  125 mcg Oral Daily  . feeding supplement  237 mL Oral TID WC  . folic acid  1 mg Oral BID  . furosemide  10 mg Intravenous Once  . LORazepam  1 mg Oral Once  . methylPREDNISolone (SOLU-MEDROL) injection  30 mg Intravenous Q8H  . DISCONTD: methylPREDNISolone (SOLU-MEDROL) injection  30 mg Intravenous Q8H  . DISCONTD: predniSONE  55 mg Oral Q breakfast   Continuous Infusions:   . sodium chloride 20 mL/hr at 06/09/11 2023  . pantoprozole (PROTONIX) infusion 8 mg/hr (06/11/11 1155)   PRN Meds:.albuterol, diphenhydrAMINE, guaiFENesin-dextromethorphan, HYDROcodone-acetaminophen, ondansetron (ZOFRAN) IV, ondansetron, DISCONTD: diphenhydrAMINE  Assessment/Plan:  Principal Problem:  *Pulmonary embolism - patient will need anticoagulation indefinitely however at this time due to low hemoglobin level he is not a good candidate for anticoagulation; patient is status post IVC filter placement.  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    Hypokalemia - perhaps diuretic induced; resolved at present but we will continue to monitor  Education - patient and family are aware of plan of care    LOS: 3 days   [name] 06/11/2011, 4:07 PM

## 2011-06-11 NOTE — Progress Notes (Signed)
Pt. Had an episode of bradycardia down to 38; on call doc. Paged; 12 lead ordered and showed sinus bradycardia; will continue to monitor.   Van Clines 06/11/2011 05:50 A.M.

## 2011-06-12 LAB — TYPE AND SCREEN
ABO/RH(D): A POS
Unit division: 0
Unit division: 0
Unit division: 0
Unit division: 0

## 2011-06-12 LAB — RETICULOCYTES
RBC.: 3 MIL/uL — ABNORMAL LOW (ref 4.22–5.81)
Retic Count, Absolute: 327 10*3/uL — ABNORMAL HIGH (ref 19.0–186.0)

## 2011-06-12 LAB — CBC
Hemoglobin: 10.7 g/dL — ABNORMAL LOW (ref 13.0–17.0)
Platelets: 112 10*3/uL — ABNORMAL LOW (ref 150–400)
RBC: 3.38 MIL/uL — ABNORMAL LOW (ref 4.22–5.81)
RDW: 25.8 % — ABNORMAL HIGH (ref 11.5–15.5)
WBC: 12.4 10*3/uL — ABNORMAL HIGH (ref 4.0–10.5)
WBC: 13.6 10*3/uL — ABNORMAL HIGH (ref 4.0–10.5)

## 2011-06-12 LAB — GLUCOSE, CAPILLARY
Glucose-Capillary: 125 mg/dL — ABNORMAL HIGH (ref 70–99)
Glucose-Capillary: 151 mg/dL — ABNORMAL HIGH (ref 70–99)

## 2011-06-12 MED ORDER — FUROSEMIDE 10 MG/ML IJ SOLN
20.0000 mg | Freq: Two times a day (BID) | INTRAMUSCULAR | Status: AC
  Administered 2011-06-12 – 2011-06-14 (×4): 20 mg via INTRAVENOUS
  Filled 2011-06-12 (×4): qty 2

## 2011-06-12 MED ORDER — METHYLPREDNISOLONE SODIUM SUCC 40 MG IJ SOLR
30.0000 mg | Freq: Two times a day (BID) | INTRAMUSCULAR | Status: DC
  Administered 2011-06-12 – 2011-06-14 (×4): 30 mg via INTRAVENOUS
  Filled 2011-06-12 (×7): qty 0.75

## 2011-06-12 NOTE — Progress Notes (Signed)
Subjective: He has noticed some swelling of hid feet, he is keeping them up, otherwise denies any fevers chill, N/V/D no bowel movements today. No evidence of bleeding  Objective:  Most recent Vital signs: Blood pressure 143/67, pulse 52, temperature 98 F (36.7 C), temperature source Oral, resp. rate 18, height 5\' 11"  (1.803 m), weight 133 lb 2.5 oz (60.4 kg), SpO2 95.00%. 5\' 11"  (1.803 m)    Body surface area is 1.74 meters squared.  Vital signs in last 24 hours: Temp:  [97.3 F (36.3 C)-99.1 F (37.3 C)] 98 F (36.7 C) (12/15 0847) Pulse Rate:  [44-88] 52  (12/15 0847) Resp:  [13-26] 18  (12/15 0847) BP: (118-172)/(67-99) 143/67 mmHg (12/15 0847) SpO2:  [95 %-99 %] 95 % (12/15 0847)  Intake/Output from previous day: 12/14 0701 - 12/15 0700 In: 1790.8 [P.O.:480; I.V.:635; Blood:675.8] Out: 850 [Urine:850]  Physical Exam: General appearance: alert, cooperative and appears stated age Neck: no adenopathy, no carotid bruit, no JVD, supple, symmetrical, trachea midline and thyroid not enlarged, symmetric, no tenderness/mass/nodules Back: symmetric, no curvature. ROM normal. No CVA tenderness. Resp: diminished breath sounds bibasilar Cardio: regular rate and rhythm, S1, S2 normal, no murmur, click, rub or gallop GI: soft, non-tender; bowel sounds normal; no masses,  no organomegaly Extremities: extremities normal, atraumatic, no cyanosis or edema and no edema by exam  Lab Results:   Basename 06/11/11 2100 06/11/11 0838  WBC 12.6* 12.2*  HGB 9.3* 7.5*  HCT 28.2* 23.4*  PLT 122* 126*   BMET:  Basename 06/09/11 1836  NA 139  K 4.0  CL 110  CO2 18*  GLUCOSE 159*  BUN 22  CREATININE 1.24  CALCIUM 8.7   CMP:     Component Value Date/Time   NA 139 06/09/2011 1836   K 4.0 06/09/2011 1836   CL 110 06/09/2011 1836   CO2 18* 06/09/2011 1836   GLUCOSE 159* 06/09/2011 1836   BUN 22 06/09/2011 1836   CREATININE 1.24 06/09/2011 1836   CALCIUM 8.7 06/09/2011 1836   PROT  6.9 06/09/2011 1836   ALBUMIN 2.5* 06/09/2011 1836   AST 19 06/09/2011 1836   ALT 10 06/09/2011 1836   ALKPHOS 79 06/09/2011 1836   BILITOT 1.5* 06/09/2011 1836   GFRNONAA 56* 06/09/2011 1836   GFRAA 65* 06/09/2011 1836      Studies/Results: Dg Chest Port 1v Same Day  06/10/2011  *RADIOLOGY REPORT*  Clinical Data: Cough and COPD  PORTABLE CHEST - 1 VIEW SAME DAY  Comparison: 06/08/2011  Findings: Heart size appears normal.  There are coarsened interstitial markings identified bilaterally.  Pulmonary venous congestion is noted.  No interstitial edema or pleural effusion identified.  Bibasilar atelectasis present with the lung bases.  IMPRESSION:  1.  Changes of COPD and pulmonary venous congestion. 2.  Bibasilar atelectasis.  Original Report Authenticated By: Rosealee Albee, M.D.    Medications: I have reviewed the patient's current medications.  Assessment/Plan: 76 y.o. male with  1. Hemolytic anemia: idiopathic, on prednisone orally 1mg /kg, hemoglobin relatively stable today after transfuion, infact it is much better.  2. Iron deficiency: s/p feraheme IV  3. PE: s/p IVC filter, no anticoagulants started up to date.  4. Will continue to follow with you, no new recommendations.     LOS: 4 days   Drue Second, MD Medical/Oncology [facility] [phone] (beeper) [phone] (Office)  06/12/2011, 9:37 AM

## 2011-06-12 NOTE — Progress Notes (Signed)
Patient ID: [name], male   DOB: Nov 09, 1934, 76 y.o.   MRN: [id number] Subjective: No events overnight. Patient denies chest pain, shortness of breath, abdominal pain. Patient is sitting in chair and reports feeling significantly better since the admission. Patient tolerates oral intake well.  Objective:  Vital signs in last 24 hours:  Filed Vitals:   06/11/11 2300 06/11/11 2338 06/12/11 0300 06/12/11 0847  BP: 118/78 149/87 152/85 143/67  Pulse: 88 51 47 52  Temp: 98.5 F (36.9 C) 98.4 F (36.9 C) 98 F (36.7 C) 98 F (36.7 C)  TempSrc: Oral Oral Oral Oral  Resp: 17 17 17 18   Height:      Weight:      SpO2: 97% 99% 97% 95%    Intake/Output from previous day:   Intake/Output Summary (Last 24 hours) at 06/12/11 1012 Last data filed at 06/12/11 0630  Gross per 24 hour  Intake 1610.83 ml  Output    650 ml  Net 960.83 ml    Physical Exam: General: Alert, awake, oriented x3, in no acute distress. HEENT: No bruits, no goiter. Moist mucous membranes, no scleral icterus, no conjunctival pallor. Heart: Regular rate and rhythm, without murmurs, rubs, gallops. Lungs: Clear to auscultation bilaterally. No wheezing, no rhonchi, no rales.  Abdomen: Soft, nontender, nondistended, positive bowel sounds. Extremities: No clubbing cyanosis or edema,  positive pedal pulses. Neuro: Grossly intact, nonfocal.    Lab Results:  Basic Metabolic Panel:    Component Value Date/Time   NA 139 06/09/2011 1836   K 4.0 06/09/2011 1836   CL 110 06/09/2011 1836   CO2 18* 06/09/2011 1836   BUN 22 06/09/2011 1836   CREATININE 1.24 06/09/2011 1836   GLUCOSE 159* 06/09/2011 1836   CALCIUM 8.7 06/09/2011 1836   CBC:    Component Value Date/Time   WBC 12.6* 06/11/2011 2100   WBC 9.7 05/26/2011 0851   HGB 9.3* 06/11/2011 2100   HGB 10.3* 05/26/2011 0851   HCT 28.2* 06/11/2011 2100   HCT 30.7* 05/26/2011 0851   PLT 122* 06/11/2011 2100   PLT 140 05/26/2011 0851   MCV 94.9 06/11/2011  2100   MCV 81.9 05/26/2011 0851   NEUTROABS 8.0* 06/08/2011 0321   NEUTROABS 4.9 05/26/2011 0851   LYMPHSABS 1.9 06/08/2011 0321   LYMPHSABS 3.5* 05/26/2011 0851   MONOABS 0.8 06/08/2011 0321   MONOABS 1.0* 05/26/2011 0851   EOSABS 0.0 06/08/2011 0321   EOSABS 0.1 05/26/2011 0851   BASOSABS 0.0 06/08/2011 0321   BASOSABS 0.1 05/26/2011 0851      Lab 06/11/11 2100 06/11/11 0838 06/10/11 1855 06/10/11 0018 06/09/11 1521 06/08/11 0321  WBC 12.6* 12.2* 12.3* 14.2* 19.9* --  HGB 9.3* 7.5* 5.1* 4.4* 5.1* --  HCT 28.2* 23.4* 15.9* 14.2* 16.9* --  PLT 122* 126* 142* 130* 139* --  MCV 94.9 99.6 114.4* 113.6* 114.2* --  MCH 31.3 31.9 36.7* 35.2* 34.5* --  MCHC 33.0 32.1 32.1 31.0 30.2 --  RDW 24.7* 28.5* 25.5* NOT CALCULATED NOT CALCULATED --  LYMPHSABS -- -- -- -- -- 1.9  MONOABS -- -- -- -- -- 0.8  EOSABS -- -- -- -- -- 0.0  BASOSABS -- -- -- -- -- 0.0  BANDABS -- -- -- -- -- --    Lab 06/09/11 1836 06/09/11 0520 06/08/11 0321  NA 139 140 137  K 4.0 3.6 3.2*  CL 110 109 100  CO2 18* 19 19  GLUCOSE 159* 107* 147*  BUN 22 17 21   CREATININE  1.24 1.21 1.38*  CALCIUM 8.7 8.5 9.5  MG -- -- --    Lab 06/08/11 0813 06/08/11 0455  INR 1.23 1.20  PROTIME -- --   Cardiac markers: No results found for this basename: CK:3,CKMB:3,TROPONINI:3,MYOGLOBIN:3 in the last 168 hours No components found with this basename: POCBNP:3 Recent Results (from the past 240 hour(s))  TECHNOLOGIST SMEAR REVIEW     Status: Normal   Collection Time   06/08/11  8:13 AM      Component Value Range Status Comment   Path Review MARKED POLYCHROMASIA   Final   MRSA PCR SCREENING     Status: Normal   Collection Time   06/08/11  7:13 PM      Component Value Range Status Comment   MRSA by PCR NEGATIVE  NEGATIVE  Final     Studies/Results: Dg Chest Port 1v Same Day  06/10/2011  IMPRESSION:  1.  Changes of COPD and pulmonary venous congestion. 2.  Bibasilar atelectasis.     Medications: Scheduled  Meds:   . sodium chloride   Intravenous Once  . clotrimazole  10 mg Oral TID  . dextromethorphan-guaiFENesin  1-2 tablet Oral Q12H  . digoxin  125 mcg Oral Daily  . feeding supplement  237 mL Oral TID WC  . folic acid  1 mg Oral BID  . furosemide  10 mg Intravenous Once  . furosemide  20 mg Intravenous BID  . LORazepam  1 mg Oral Once  . methylPREDNISolone (SOLU-MEDROL) injection  30 mg Intravenous Q12H  . DISCONTD: methylPREDNISolone (SOLU-MEDROL) injection  30 mg Intravenous Q8H   Continuous Infusions:   . sodium chloride 20 mL/hr at 06/11/11 1900  . 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 Patient has tolerated the transfusion very well and reports feeling significantly better since admission.  Active Problems:    Pulmonary embolism - as per pulmonary recommendations patient will need lifelong treatment with anticoagulants however due to very low hemoglobin on admission (around 4) patient was not a candidate for anticoagulation an IVC filter was placed at the time.    Iron deficiency anemia - patient is status post IV iron. Hemoglobin and hematocrit are stable at present.   Metabolic acidosis - likely secondary to multiple rounds of transfusion, as patient will be receiving Lasix for lower extremity swelling we will leave this regimen as it may increase CO2 as well.    Hypokalemia - resolved at present.   Lower extremity swelling - likely secondary to multiple transfusions, we will be giving Lasix 20 mg twice daily IV and we will reassess  with the swelling is coming down   Education - patient and family are aware of plan of care and treatment   LOS: 4 days   [name] 06/12/2011, 10:12 AM

## 2011-06-12 NOTE — Progress Notes (Signed)
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   Patient looks better today  Consider TED hose for the legs: doubt significant DVT in either leg  Subjective    No complaints, notes some foot swelling  Objective Filed Vitals:   06/11/11 2300 06/11/11 2338 06/12/11 0300 06/12/11 0847  BP: 118/78 149/87 152/85 143/67  Pulse: 88 51 47 52  Temp: 98.5 F (36.9 C) 98.4 F (36.9 C) 98 F (36.7 C) 98 F (36.7 C)  TempSrc: Oral Oral Oral Oral  Resp: 17 17 17 18   Height:      Weight:      SpO2: 97% 99% 97% 95%    Intake/Output Summary (Last 24 hours) at 06/12/11 1044 Last data filed at 06/12/11 0630  Gross per 24 hour  Intake 1610.83 ml  Output    650 ml  Net 960.83 ml    PULM  CTAB CV  RRR GI  soft, NTND, inc healed VASC  B groins healed, both feet farm and somewhat swollen  Laboratory CBC    Component Value Date/Time   WBC 12.6* 06/11/2011 2100   WBC 9.7 05/26/2011 0851   HGB 9.3* 06/11/2011 2100   HGB 10.3* 05/26/2011 0851   HCT 28.2* 06/11/2011 2100   HCT 30.7* 05/26/2011 0851   PLT 122* 06/11/2011 2100   PLT 140 05/26/2011 0851    BMET    Component Value Date/Time   NA 139 06/09/2011 1836   K 4.0 06/09/2011 1836   CL 110 06/09/2011 1836   CO2 18* 06/09/2011 1836   GLUCOSE 159* 06/09/2011 1836   BUN 22 06/09/2011 1836   CREATININE 1.24 06/09/2011 1836   CALCIUM 8.7 06/09/2011 1836   GFRNONAA 56* 06/09/2011 1836   GFRAA 65* 06/09/2011 1836    [name], MD [facility] Office: [phone] Pager: [phone]  06/12/2011, 10:44 AM

## 2011-06-13 DIAGNOSIS — D72829 Elevated white blood cell count, unspecified: Secondary | ICD-10-CM | POA: Diagnosis not present

## 2011-06-13 DIAGNOSIS — R001 Bradycardia, unspecified: Secondary | ICD-10-CM | POA: Diagnosis present

## 2011-06-13 LAB — DIGOXIN LEVEL: Digoxin Level: 0.4 ng/mL — ABNORMAL LOW (ref 0.8–2.0)

## 2011-06-13 LAB — RETICULOCYTES: Retic Ct Pct: 10 % — ABNORMAL HIGH (ref 0.4–3.1)

## 2011-06-13 MED ORDER — PANTOPRAZOLE SODIUM 40 MG IV SOLR
40.0000 mg | Freq: Two times a day (BID) | INTRAVENOUS | Status: DC
  Administered 2011-06-13 – 2011-06-15 (×5): 40 mg via INTRAVENOUS
  Filled 2011-06-13 (×6): qty 40

## 2011-06-13 MED ORDER — DIGOXIN 0.0625 MG HALF TABLET
0.0625 mg | ORAL_TABLET | Freq: Every day | ORAL | Status: DC
  Administered 2011-06-13 – 2011-06-15 (×2): 0.0625 mg via ORAL
  Filled 2011-06-13 (×4): qty 1

## 2011-06-13 NOTE — Progress Notes (Addendum)
Patient ID: [name], male   DOB: 05-07-1935, 76 y.o.   MRN: [id number] Subjective: No events overnight. Patient denies chest pain, shortness of breath, abdominal pain.   Objective:  Vital signs in last 24 hours:  Filed Vitals:   06/12/11 2009 06/13/11 0027 06/13/11 0358 06/13/11 0407  BP: 157/75 123/79 129/58   Pulse: 57 56 50   Temp: 97.5 F (36.4 C) 98 F (36.7 C) 98.1 F (36.7 C)   TempSrc: Oral Oral Oral   Resp: 20 15 15    Height:      Weight:    56.654 kg (124 lb 14.4 oz)  SpO2: 98% 95% 94%     Intake/Output from previous day:   Intake/Output Summary (Last 24 hours) at 06/13/11 0840 Last data filed at 06/13/11 0539  Gross per 24 hour  Intake   1375 ml  Output   2900 ml  Net  -1525 ml    Physical Exam: General: Alert, awake, oriented x3, in no acute distress. HEENT: No bruits, no goiter. Moist mucous membranes, no scleral icterus, no conjunctival pallor. Heart: Regular rate and rhythm, bradycardic; without murmurs, rubs, gallops. Lungs: Clear to auscultation bilaterally. No wheezing, no rhonchi, no rales.  Abdomen: Soft, nontender, nondistended, positive bowel sounds. Extremities: No clubbing cyanosis or edema,  positive pedal pulses. Neuro: Grossly intact, nonfocal.    Lab Results:  Basic Metabolic Panel:    Component Value Date/Time   NA 139 06/09/2011 1836   K 4.0 06/09/2011 1836   CL 110 06/09/2011 1836   CO2 18* 06/09/2011 1836   BUN 22 06/09/2011 1836   CREATININE 1.24 06/09/2011 1836   GLUCOSE 159* 06/09/2011 1836   CALCIUM 8.7 06/09/2011 1836   CBC:    Component Value Date/Time   WBC 13.6* 06/12/2011 1833   WBC 9.7 05/26/2011 0851   HGB 10.7* 06/12/2011 1833   HGB 10.3* 05/26/2011 0851   HCT 32.5* 06/12/2011 1833   HCT 30.7* 05/26/2011 0851   PLT 126* 06/12/2011 1833   PLT 140 05/26/2011 0851   MCV 96.2 06/12/2011 1833   MCV 81.9 05/26/2011 0851   NEUTROABS 8.0* 06/08/2011 0321   NEUTROABS 4.9 05/26/2011 0851   LYMPHSABS 1.9  06/08/2011 0321   LYMPHSABS 3.5* 05/26/2011 0851   MONOABS 0.8 06/08/2011 0321   MONOABS 1.0* 05/26/2011 0851   EOSABS 0.0 06/08/2011 0321   EOSABS 0.1 05/26/2011 0851   BASOSABS 0.0 06/08/2011 0321   BASOSABS 0.1 05/26/2011 0851      Lab 06/12/11 1833 06/12/11 1017 06/11/11 2100 06/11/11 0838 06/10/11 1855 06/08/11 0321  WBC 13.6* 12.4* 12.6* 12.2* 12.3* --  HGB 10.7* 9.7* 9.3* 7.5* 5.1* --  HCT 32.5* 29.9* 28.2* 23.4* 15.9* --  PLT 126* 112* 122* 126* 142* --  MCV 96.2 96.5 94.9 99.6 114.4* --  MCH 31.7 31.3 31.3 31.9 36.7* --  MCHC 32.9 32.4 33.0 32.1 32.1 --  RDW 25.7* 25.8* 24.7* 28.5* 25.5* --  LYMPHSABS -- -- -- -- -- 1.9  MONOABS -- -- -- -- -- 0.8  EOSABS -- -- -- -- -- 0.0  BASOSABS -- -- -- -- -- 0.0  BANDABS -- -- -- -- -- --    Lab 06/09/11 1836 06/09/11 0520 06/08/11 0321  NA 139 140 137  K 4.0 3.6 3.2*  CL 110 109 100  CO2 18* 19 19  GLUCOSE 159* 107* 147*  BUN 22 17 21   CREATININE 1.24 1.21 1.38*  CALCIUM 8.7 8.5 9.5  MG -- -- --  Lab 06/08/11 0813 06/08/11 0455  INR 1.23 1.20  PROTIME -- --   Cardiac markers: No results found for this basename: CK:3,CKMB:3,TROPONINI:3,MYOGLOBIN:3 in the last 168 hours No components found with this basename: POCBNP:3 Recent Results (from the past 240 hour(s))  TECHNOLOGIST SMEAR REVIEW     Status: Normal   Collection Time   06/08/11  8:13 AM      Component Value Range Status Comment   Path Review MARKED POLYCHROMASIA   Final   MRSA PCR SCREENING     Status: Normal   Collection Time   06/08/11  7:13 PM      Component Value Range Status Comment   MRSA by PCR NEGATIVE  NEGATIVE  Final     Studies/Results: No results found.  Medications: Scheduled Meds:   . clotrimazole  10 mg Oral TID  . dextromethorphan-guaiFENesin  1-2 tablet Oral Q12H  . digoxin  125 mcg Oral Daily  . feeding supplement  237 mL Oral TID WC  . folic acid  1 mg Oral BID  . furosemide  10 mg Intravenous Once  . furosemide  20 mg  Intravenous BID  . LORazepam  1 mg Oral Once  . methylPREDNISolone (SOLU-MEDROL) injection  30 mg Intravenous Q12H  . DISCONTD: methylPREDNISolone (SOLU-MEDROL) injection  30 mg Intravenous Q8H   Continuous Infusions:   . sodium chloride 20 mL/hr at 06/12/11 1900  . 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 Patient has tolerated the transfusion very well and reports feeling significantly better since admission. . We will continue steroids for approximately 1 mg/kg dosing; hematology is following.  Active Problems:  Pulmonary embolism - as per pulmonary recommendations patient will need lifelong treatment with anticoagulants however due to very low hemoglobin on admission (around 4) patient was not a candidate for anticoagulation an IVC filter was placed at the time.  Iron deficiency anemia - patient is status post IV iron. Hemoglobin and hematocrit are stable at present.  Metabolic acidosis - likely secondary to multiple rounds of transfusion, as patient will be receiving Lasix for lower extremity swelling we will leave this regimen as it may increase CO2 as well; follow up repeat BMP.  Bradycardia - we will check digoxin level and we will decrease digoxin dose for now and continue to monitor; patient is asymptomatic with no complaints of chest pain.  Hypokalemia - resolved at present.  Leukocytosis - likely secondary to blood transfusion; patient is afebrile so will continue to monitor for no and will defer antibiotics for now  Lower extremity swelling -  likely secondary to multiple transfusions, we will be giving Lasix 20 mg twice daily IV and we will reassess with the swelling is coming down  Education - patient and family are aware of plan of care and treatment       LOS: 5 days   Lailani Tool 06/13/2011, 8:40 AM

## 2011-06-13 NOTE — Progress Notes (Signed)
Subjective No new complaints, feels well, slept well, no bleeding  Objective:  Most recent Vital signs: Blood pressure 129/58, pulse 50, temperature 98.1 F (36.7 C), temperature source Oral, resp. rate 15, height 5\' 11"  (1.803 m), weight 124 lb 14.4 oz (56.654 kg), SpO2 94.00%. 5\' 11"  (1.803 m)    Body surface area is 1.69 meters squared.   Physical Exam: General appearance: alert, cooperative and appears stated age Resp: clear to auscultation bilaterally and normal percussion bilaterally Cardio: regular rate and rhythm, S1, S2 normal, no murmur, click, rub or gallop GI: soft, non-tender; bowel sounds normal; no masses,  no organomegaly Extremities: extremities normal, atraumatic, no cyanosis or edema Neurologic: Alert and oriented X 3, normal strength and tone. Normal symmetric reflexes. Normal coordination and gait  Lab Results:   Basename 06/12/11 1833 06/12/11 1017  WBC 13.6* 12.4*  HGB 10.7* 9.7*  HCT 32.5* 29.9*  PLT 126* 112*    Medications: I have reviewed the patient's current medications.  Assessment/Plan: 76 y.o. male with  1.Hemolytic anemia: stable, although no new cbc from today yet 2. Iron deficiency 3. Pulmonary Embolism: IVC filter, not candidate for anticoagulation    LOS: 5 days   Drue Second, MD Medical/Oncology [facility] [phone] (beeper) [phone] (Office)  06/13/2011, 7:28 AM

## 2011-06-14 DIAGNOSIS — I714 Abdominal aortic aneurysm, without rupture: Secondary | ICD-10-CM

## 2011-06-14 LAB — LACTATE DEHYDROGENASE: LDH: 280 U/L — ABNORMAL HIGH (ref 94–250)

## 2011-06-14 LAB — DIFFERENTIAL
Basophils Absolute: 0 10*3/uL (ref 0.0–0.1)
Eosinophils Absolute: 0 10*3/uL (ref 0.0–0.7)
Lymphocytes Relative: 25 % (ref 12–46)
Monocytes Absolute: 1 10*3/uL (ref 0.1–1.0)
Neutrophils Relative %: 66 % (ref 43–77)

## 2011-06-14 LAB — CBC
HCT: 36.2 % — ABNORMAL LOW (ref 39.0–52.0)
Hemoglobin: 12 g/dL — ABNORMAL LOW (ref 13.0–17.0)
MCV: 97.1 fL (ref 78.0–100.0)
RBC: 3.73 MIL/uL — ABNORMAL LOW (ref 4.22–5.81)
WBC: 10.6 10*3/uL — ABNORMAL HIGH (ref 4.0–10.5)

## 2011-06-14 LAB — COMPREHENSIVE METABOLIC PANEL
Alkaline Phosphatase: 73 U/L (ref 39–117)
BUN: 30 mg/dL — ABNORMAL HIGH (ref 6–23)
CO2: 26 mEq/L (ref 19–32)
Chloride: 102 mEq/L (ref 96–112)
Creatinine, Ser: 1.25 mg/dL (ref 0.50–1.35)
GFR calc non Af Amer: 55 mL/min — ABNORMAL LOW (ref >90)
Glucose, Bld: 96 mg/dL (ref 70–99)
Potassium: 3.7 mEq/L (ref 3.5–5.1)
Total Bilirubin: 0.9 mg/dL (ref 0.3–1.2)

## 2011-06-14 LAB — RETICULOCYTES: Retic Ct Pct: 9.9 % — ABNORMAL HIGH (ref 0.4–3.1)

## 2011-06-14 MED ORDER — PREDNISONE 20 MG PO TABS
20.0000 mg | ORAL_TABLET | Freq: Once | ORAL | Status: AC
  Administered 2011-06-14: 20 mg via ORAL
  Filled 2011-06-14: qty 1

## 2011-06-14 MED ORDER — PREDNISONE 50 MG PO TABS
50.0000 mg | ORAL_TABLET | Freq: Every day | ORAL | Status: DC
  Administered 2011-06-15: 50 mg via ORAL
  Filled 2011-06-14 (×2): qty 1

## 2011-06-14 MED ORDER — FOLIC ACID 1 MG PO TABS
1.0000 mg | ORAL_TABLET | Freq: Every day | ORAL | Status: DC
  Administered 2011-06-14 – 2011-06-15 (×2): 1 mg via ORAL
  Filled 2011-06-14 (×2): qty 1

## 2011-06-14 NOTE — Progress Notes (Signed)
Utilization Review Completed.Mavis Gravelle T12/17/2012   

## 2011-06-14 NOTE — Progress Notes (Signed)
Patient ID: [name], male   DOB: 1934-10-15, 76 y.o.   MRN: [id number] Subjective: No events overnight. Patient denies chest pain, shortness of breath, abdominal pain. Patients is eager to go home today as he feels significantly better today.  Objective:  Vital signs in last 24 hours:  Filed Vitals:   06/13/11 2000 06/14/11 0000 06/14/11 0400 06/14/11 0800  BP: 132/78 123/68 131/78 147/80  Pulse: 62 52 51 49  Temp: 98.1 F (36.7 C) 98.4 F (36.9 C) 98.5 F (36.9 C) 98 F (36.7 C)  TempSrc: Oral Oral Oral Oral  Resp: 16 16 16 14   Height:      Weight:   55.9 kg (123 lb 3.8 oz)   SpO2: 97% 98%  100%    Intake/Output from previous day:   Intake/Output Summary (Last 24 hours) at 06/14/11 1121 Last data filed at 06/14/11 0800  Gross per 24 hour  Intake    720 ml  Output   2775 ml  Net  -2055 ml    Physical Exam: General: Alert, awake, oriented x3, in no acute distress. HEENT: No bruits, no goiter. Moist mucous membranes, no scleral icterus, no conjunctival pallor. Heart: Regular rate and rhythm, S1/S2 +, no murmurs, rubs, gallops. Lungs: Clear to auscultation bilaterally. No wheezing, no rhonchi, no rales.  Abdomen: Soft, nontender, nondistended, positive bowel sounds. Extremities: No clubbing or cyanosis, no pitting edema,  positive pedal pulses. Neuro: Grossly nonfocal.  Lab Results:  Basic Metabolic Panel:    Component Value Date/Time   NA 138 06/14/2011 0945   K 3.7 06/14/2011 0945   CL 102 06/14/2011 0945   CO2 26 06/14/2011 0945   BUN 30* 06/14/2011 0945   CREATININE 1.25 06/14/2011 0945   GLUCOSE 96 06/14/2011 0945   CALCIUM 8.9 06/14/2011 0945   CBC:    Component Value Date/Time   WBC 10.6* 06/14/2011 0945   WBC 9.7 05/26/2011 0851   HGB 12.0* 06/14/2011 0945   HGB 10.3* 05/26/2011 0851   HCT 36.2* 06/14/2011 0945   HCT 30.7* 05/26/2011 0851   PLT PENDING 06/14/2011 0945   PLT 140 05/26/2011 0851   MCV 97.1 06/14/2011 0945   MCV 81.9  05/26/2011 0851   NEUTROABS PENDING 06/14/2011 0945   NEUTROABS 4.9 05/26/2011 0851   LYMPHSABS PENDING 06/14/2011 0945   LYMPHSABS 3.5* 05/26/2011 0851   MONOABS PENDING 06/14/2011 0945   MONOABS 1.0* 05/26/2011 0851   EOSABS PENDING 06/14/2011 0945   EOSABS 0.1 05/26/2011 0851   BASOSABS PENDING 06/14/2011 0945   BASOSABS 0.1 05/26/2011 0851      Lab 06/14/11 0945 06/12/11 1833 06/12/11 1017 06/11/11 2100 06/11/11 0838 06/08/11 0321  WBC 10.6* 13.6* 12.4* 12.6* 12.2* --  HGB 12.0* 10.7* 9.7* 9.3* 7.5* --  HCT 36.2* 32.5* 29.9* 28.2* 23.4* --  PLT PENDING 126* 112* 122* 126* --  MCV 97.1 96.2 96.5 94.9 99.6 --  MCH 32.2 31.7 31.3 31.3 31.9 --  MCHC 33.1 32.9 32.4 33.0 32.1 --  RDW 24.6* 25.7* 25.8* 24.7* 28.5* --  LYMPHSABS PENDING -- -- -- -- 1.9  MONOABS PENDING -- -- -- -- 0.8  EOSABS PENDING -- -- -- -- 0.0  BASOSABS PENDING -- -- -- -- 0.0  BANDABS -- -- -- -- -- --    Lab 06/14/11 0945 06/09/11 1836 06/09/11 0520 06/08/11 0321  NA 138 139 140 137  K 3.7 4.0 3.6 3.2*  CL 102 110 109 100  CO2 26 18* 19 19  GLUCOSE 96 159*  107* 147*  BUN 30* 22 17 21   CREATININE 1.25 1.24 1.21 1.38*  CALCIUM 8.9 8.7 8.5 9.5  MG -- -- -- --    Lab 06/08/11 0813 06/08/11 0455  INR 1.23 1.20  PROTIME -- --   Cardiac markers: No results found for this basename: CK:3,CKMB:3,TROPONINI:3,MYOGLOBIN:3 in the last 168 hours No components found with this basename: POCBNP:3 Recent Results (from the past 240 hour(s))  TECHNOLOGIST SMEAR REVIEW     Status: Normal   Collection Time   06/08/11  8:13 AM      Component Value Range Status Comment   Path Review MARKED POLYCHROMASIA   Final   MRSA PCR SCREENING     Status: Normal   Collection Time   06/08/11  7:13 PM      Component Value Range Status Comment   MRSA by PCR NEGATIVE  NEGATIVE  Final     Studies/Results: No results found.  Medications: Scheduled Meds:   . clotrimazole  10 mg Oral TID  . dextromethorphan-guaiFENesin   1-2 tablet Oral Q12H  . digoxin  0.0625 mg Oral Daily  . feeding supplement  237 mL Oral TID WC  . folic acid  1 mg Oral Daily  . furosemide  10 mg Intravenous Once  . furosemide  20 mg Intravenous BID  . LORazepam  1 mg Oral Once  . pantoprazole (PROTONIX) IV  40 mg Intravenous Q12H  . predniSONE  20 mg Oral Once  . predniSONE  50 mg Oral Q breakfast  . DISCONTD: folic acid  1 mg Oral BID  . 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 Patient is definitely doing a lot better clinically and hemoglobin has trended upwards to 12.  Active Problems:   Pulmonary embolism - as per pulmonary patient needs lifelong anticoagulation but the treatment is deferred to heme/onc once hemolytic anemia is stable. As per heme/onc this will most likely be initiated on an outpatient basis. For now, patient is status post IVC filter placement which as per pulmonary can provide adequate protection against recurrent PE for few weeks.    Iron deficiency anemia - hemoglobin and hematocrit stable, patient is status post transfusion with 4 units of packed RBCs     Hypokalemia - resolved    Leukocytosis - likely secondary to blood transfusion    Bradycardia, sinus - stable around 55 beats per minute; patient remains asymptomatic   Disposition - anticipated discharge tomorrow morning   Advance directives - full code    EDUCATION - test results and diagnostic studies were discussed with patient and pt's family  - patient and family have verbalized the understanding - questions were answered at the  bedside and contact information was provided for additional questions or concerns   LOS: 6 days   DEVINE, ALMA 06/14/2011, 11:21 AM  [facility] Pager: [phone]

## 2011-06-14 NOTE — Progress Notes (Signed)
  06/14/2011, 8:58 AM  Hospital day: 7 Antibiotics: none Steroids day ~ 6    Subjective: Awake, alert, sitting up in bed eating breakfast, wife here. Tells me he walked to [place] and sat up in chair yesterday. Breathing better with little cough. No bleeding. No mucositis symptoms. Did sleep last pm. Hopes to go home soon, but still on IV steroids and no CBC since 12-15. 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 rate 14, height 5\' 11"  (1.803 m), weight 123 lb 3.8 oz (55.9 kg), SpO2 100.00%. Looks much better! Breathing not labored on RA. Has eaten all of breakfast, Ensure also beside bed. Oral mucosa clear and moist (on mycelex tid with steroids). PERR, not clearly icteric. Lungs with diminished breath sounds but no rhonchi or wheeze. Cor RRR. Abd soft, NT including epigastrium, normal BS. LE no edema/cords.  Intake/Output from previous day: 12/16 0701 - 12/17 0700 In: 1530 [P.O.:1440; I.V.:40; IV Piggyback:50] Out: 2850 [Urine:2850] Intake/Output this shift: Total I/O In: -  Out: 225 [Urine:225]     Lab Results:  [facility] 06/12/11 1833 06/12/11 1017  WBC 13.6* 12.4*  HGB 10.7* 9.7*  HCT 32.5* 29.9*  PLT 126* 112*  reticulocytes today 9.9% BMET No results found for this basename: NA:2,K:2,CL:2,CO2:2,GLUCOSE:2,BUN:2,CREATININE:2,CALCIUM:2 in the last 72 hours LDH slightly higher at 270 Last CMET 12-12  CBC and CMET ordered now to be done today.  Extended conversation with Epic help desk as I have tried to confirm what steroid doses he has actually received. He has been on solumedrol 30 mg IV bid since (?) 12-15, equivalent to 1 mg/kg prednisone. Per RN now, AM 30 mg dose was just given. I have DC'd the solumedrol IV, will give one dose prednisone 20 mg with lunch  today, then 50 mg q am with breakfast beginning 06-15-11.  Studies/Results: No results found.   Assessment/Plan: 1. Hemolytic anemia: CBC and CMET pending this am. Change steroids to po, dosing as above still at ~ 1 mg/kg/24 hrs. Continue protonix and mycelex troches while on steroids 2. Iron deficiency anemia: post IV iron this admission. Needs endoscopy outpatient when stable 3.Previous low platelets in Sept with negative evaluation for HIT then 4.AAA repair  5.Long tobacco, recently DCd, underlying COPD 6.PE on admission, IVC filter in. Would not begin anticoagulation now Will follow   Keandria Berrocal P

## 2011-06-15 ENCOUNTER — Telehealth: Payer: Self-pay

## 2011-06-15 LAB — CBC
Hemoglobin: 11.3 g/dL — ABNORMAL LOW (ref 13.0–17.0)
MCH: 31.7 pg (ref 26.0–34.0)
MCHC: 32.3 g/dL (ref 30.0–36.0)
MCV: 98 fL (ref 78.0–100.0)
Platelets: 111 10*3/uL — ABNORMAL LOW (ref 150–400)
RBC: 3.57 MIL/uL — ABNORMAL LOW (ref 4.22–5.81)

## 2011-06-15 LAB — RETICULOCYTES
RBC.: 3.57 MIL/uL — ABNORMAL LOW (ref 4.22–5.81)
Retic Count, Absolute: 289.2 10*3/uL — ABNORMAL HIGH (ref 19.0–186.0)

## 2011-06-15 MED ORDER — FOLIC ACID 1 MG PO TABS: 1.0000 mg | ORAL_TABLET | Freq: Every day | ORAL | Status: DC

## 2011-06-15 MED ORDER — PREDNISONE 50 MG PO TABS: 50.0000 mg | ORAL_TABLET | Freq: Every day | ORAL | Status: DC

## 2011-06-15 MED ORDER — METOPROLOL TARTRATE 50 MG PO TABS: 25.0000 mg | ORAL_TABLET | Freq: Two times a day (BID) | ORAL | Status: DC

## 2011-06-15 NOTE — Discharge Summary (Signed)
Patient ID: [name] MRN: [id number] DOB/AGE: July 14, 1934 76 y.o.  Admit date: 06/08/2011 Discharge date: 06/15/2011  Primary Care Physician:  No primary provider on file.  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 Qty: 60 tablet, Refills: 0      CONTINUE these medications which have NOT CHANGED   Details  albuterol (PROVENTIL HFA;VENTOLIN HFA) 108 (90 BASE) MCG/ACT inhaler Inhale 1-2 puffs into the lungs every 4 (four) hours as needed.      dextromethorphan-guaiFENesin (MUCINEX DM) 30-600 MG per 12 hr tablet Take 1-2 tablets by mouth every 12 (twelve) hours.      digoxin (LANOXIN) 0.125 MG tablet Take 125 mcg by mouth daily.      FeFum-FePoly-FA-B Cmp-C-Biot (INTEGRA PLUS) CAPS Take 1 capsule by mouth daily.      ferrous sulfate 325 (65 FE) MG tablet Take 325 mg by mouth daily with breakfast.      Multiple Vitamin (MULTIVITAMIN) capsule Take 1 capsule by mouth daily.      Nutritional Supplements (ENSURE PO) Take by mouth daily.      Pantoprazole Sodium (PROTONIX PO) Take 40 mg by mouth 2 (two) times daily.         Disposition and Follow-up: hematology in 1 week to monitor Hemoglobin  Consults: 1. Gastroenterology        2. Hematology/Oncology        3. Pulmonary/Critical care   Significant Diagnostic Studies:  Ct Abdomen Pelvis Wo Contrast  06/08/2011  *RADIOLOGY REPORT*  Clinical Data: Anemia.  History of abdominal aortic aneurysm. COPD.  Right lower quadrant pain.  CT ABDOMEN AND PELVIS WITHOUT CONTRAST  Technique:  Multidetector CT imaging of the abdomen and pelvis was performed following the standard protocol without intravenous contrast.  Comparison: Earlier today CTA of the abdomen at 0500 hours.Abdominal pelvic CT of 03/10/2011.  Findings: Scarring at the right lung base anteriorly.  Developing airspace disease at the right lung base.  Mild scarring at the left lung base.  Cardiomegaly without pericardial or pleural effusion.  Exam degraded by lack of IV contrast.  There is contrast within the renal collecting systems and bladder from the CTA of earlier in the day.  Normal liver, spleen, stomach, pancreas.  Gallstones without acute cholecystitis or biliary ductal dilatation.  Normal adrenal glands. Right greater than left bilateral renal cysts.  Repair of abdominal aortic aneurysm.  Better evaluated on earlier contrast enhanced study.  No surrounding hemorrhage or retroperitoneal adenopathy.  Sigmoid diverticulosis with probable muscular hypertrophy.  Normal terminal ileum. Appendix not visualized.  Normal caliber of small bowel loops.  No pneumatosis, free intraperitoneal air, or  ascites.  No pelvic adenopathy.  Normal appearance of the anastomotic sites within the groins bilaterally.  No evidence of complicating fluid collection or surrounding hemorrhage.  Urinary bladder is mildly distended.  Moderate prostatomegaly. No significant free fluid.  Mild osteopenia.  Degenerative partial fusion of the bilateral sacroiliac joints.  Lower lumbar spondylosis.  IMPRESSION:  1.  No acute findings within the  pelvis. 2.  No interval change within the abdomen since the CTA of the abdomen of 4 hours ago. 3.  Cholelithiasis.  4.  Degraded exam secondary lack of IV contrast. 5.  Suspect developing right base infection or aspiration.  Original Report Authenticated By: Consuello Bossier, M.D.   Dg Ribs Unilateral W/chest Right  06/08/2011  *RADIOLOGY REPORT*  Clinical Data: Cough, congestion, near syncope, right-sided chest and abdomen pain  RIGHT RIBS AND CHEST - 3+ VIEW  Comparison: 05/06/2011  Findings: Shallow inspiration.  Emphysematous changes in the lungs. Scattered fibrosis.  No focal airspace consolidation.  Normal heart size and pulmonary vascularity.  Tortuous and ectatic aorta.  No pneumothorax.  Right ribs appear intact without displaced fracture.  IMPRESSION: Emphysematous changes and fibrosis in the lungs.  No evidence of active pulmonary disease.  No displaced right rib fractures.  Original Report Authenticated By: Marlon Pel, M.D.   Ct Angio Chest W/cm &/or Wo Cm  06/08/2011  IMPRESSION: Ectatic thoracic aorta without aneurysm or dissection.  Filling defect in a right upper lung pulmonary arteries suggesting subsegmental pulmonary embolus.  Emphysema and fibrosis in the lungs.  CTA ABDOMEN  Findings:  The study is abdominal and extends to the upper aspect of the iliac crest.  This does not include the entire aneurysm repair.  Distal aneurysm sac and repair remains indeterminate as it is not visualized.  There is an infrarenal abdominal aortic aneurysm post repair. Calcification of the native sac with thrombosis of the native sac and patency of the graft.  No retroperitoneal fluid collection or contrast extravasation.  Atherosclerotic changes in the upper abdominal aorta.  Vascular calcifications in the branch vessels. Superior mesenteric artery, celiac axis, and renal artery origins appear patent.  The inferior mesenteric artery is not demonstrated.Calcified granulomas in the spleen.  The liver  parenchyma is homogeneous.  Cholelithiasis.  No bile duct dilatation.  The pancreas and adrenal glands are unremarkable. Suggestion of small stones versus vascular calcifications throughout the kidneys.  No evidence of obstruction.  Cyst in the right kidney measures about 3.3 cm diameter.  Visualized stomach, small bowel, and large bowel are decompressed.  No free air or free fluid in the abdomen.  Degenerative changes in the thoracic and lumbar spine.   Review of the MIP images confirms the above findings.  IMPRESSION: Postoperative change with repair of abdominal aortic aneurysm. This is not completely included on the study.  No retroperitoneal fluid collection or contrast extravasation demonstrated in the visualized abdomen.  No free air or free fluid in the abdomen. Cholelithiasis.  Results discussed with Dr. Norlene Campbell at the time of dictation, [phone] hours on 06/08/2011.    Ct Angio Abdomen W/cm &/or Wo Contrast  06/08/2011 IMPRESSION: Ectatic thoracic aorta without aneurysm or dissection.  Filling defect in a right upper lung pulmonary arteries suggesting subsegmental pulmonary embolus.  Emphysema and fibrosis in the lungs.  CTA ABDOMEN  Findings:  The study is abdominal and extends to the upper aspect of the iliac crest.  This does not include the entire aneurysm repair.  Distal aneurysm sac and repair remains indeterminate  as it is not visualized.  There is an infrarenal abdominal aortic aneurysm post repair. Calcification of the native sac with thrombosis of the native sac and patency of the graft.  No retroperitoneal fluid collection or contrast extravasation.  Atherosclerotic changes in the upper abdominal aorta.  Vascular calcifications in the branch vessels. Superior mesenteric artery, celiac axis, and renal artery origins appear patent.  The inferior mesenteric artery is not demonstrated.Calcified granulomas in the spleen.  The liver parenchyma is homogeneous.  Cholelithiasis.  No bile duct dilatation.   The pancreas and adrenal glands are unremarkable. Suggestion of small stones versus vascular calcifications throughout the kidneys.  No evidence of obstruction.  Cyst in the right kidney measures about 3.3 cm diameter.  Visualized stomach, small bowel, and large bowel are decompressed.  No free air or free fluid in the abdomen.  Degenerative changes in the thoracic and lumbar spine.   Review of the MIP images confirms the above findings.  IMPRESSION: Postoperative change with repair of abdominal aortic aneurysm. This is not completely included on the study.  No retroperitoneal fluid collection or contrast extravasation demonstrated in the visualized abdomen.  No free air or free fluid in the abdomen. Cholelithiasis.  Results discussed with Dr. Norlene Campbell at the time of dictation, [phone] hours on 06/08/2011.   Ir Ivc Filter Placement  06/08/2011  IMPRESSION: 1.  Normal IVC. No thrombus or significant anatomic variation. 2.  Technically successful infrarenal IVC filter placement. This is a retrievable model.   Brief H and P: 76 yo male presents to [facility] ED today for evaluation of right flank pain. Patient found to have a right lung pulmonary embolus. Hb. Noted to be in the 4 to 5 range. Patient had no complaints of chest pain but as per wife she noted he appeared to be short of breath and has complained of feeling week.  Physical Exam on Discharge:  Filed Vitals:   06/15/11 0000 06/15/11 0317 06/15/11 0500 06/15/11 0800  BP: 130/68 126/85  124/77  Pulse: 73 60  57  Temp: 98.7 F (37.1 C) 98 F (36.7 C)  98.5 F (36.9 C)  TempSrc: Oral Oral  Oral  Resp: 16 15  16   Height:      Weight:  55.4 kg (122 lb 2.2 oz) 55.4 kg (122 lb 2.2 oz)   SpO2: 92% 95%  93%     Intake/Output Summary (Last 24 hours) at 06/15/11 1346 Last data filed at 06/15/11 0800  Gross per 24 hour  Intake    150 ml  Output   1100 ml  Net   -950 ml    General: Alert, awake, oriented x3, in no acute  distress. HEENT: No bruits, no goiter. Heart: Regular rate and rhythm, without murmurs, rubs, gallops. Lungs: Clear to auscultation bilaterally. Abdomen: Soft, nontender, nondistended, positive bowel sounds. Extremities: No clubbing cyanosis or edema with positive pedal pulses. Neuro: Grossly intact, nonfocal.  CBC:    Component Value Date/Time   WBC 9.7 06/15/2011 0400   WBC 9.7 05/26/2011 0851   HGB 11.3* 06/15/2011 0400   HGB 10.3* 05/26/2011 0851   HCT 35.0* 06/15/2011 0400   HCT 30.7* 05/26/2011 0851   PLT 111* 06/15/2011 0400   PLT 140 05/26/2011 0851   MCV 98.0 06/15/2011 0400   MCV 81.9 05/26/2011 0851   NEUTROABS 6.9 06/14/2011 0945   NEUTROABS 4.9 05/26/2011 0851   LYMPHSABS 2.7 06/14/2011 0945   LYMPHSABS 3.5* 05/26/2011 0851   MONOABS 1.0 06/14/2011 0945  MONOABS 1.0* 05/26/2011 0851   EOSABS 0.0 06/14/2011 0945   EOSABS 0.1 05/26/2011 0851   BASOSABS 0.0 06/14/2011 0945   BASOSABS 0.1 05/26/2011 0851    Basic Metabolic Panel:    Component Value Date/Time   NA 138 06/14/2011 0945   K 3.7 06/14/2011 0945   CL 102 06/14/2011 0945   CO2 26 06/14/2011 0945   BUN 30* 06/14/2011 0945   CREATININE 1.25 06/14/2011 0945   GLUCOSE 96 06/14/2011 0945   CALCIUM 8.9 06/14/2011 0945    Hospital Course:  Principal Problem:  *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 Patient will follow up with hematology to monitor hemoglobin level and stability of hemolytic anemia. Patient is status post total of 4 units of packed red blood cell transfusion and has tolerated it very well. We will continue ferrous sulfate 325 mg on discharge.  Active Problems:  Pulmonary embolism - patient needs a long term anticoagulation but this will be initiated once we can ensure his hemolytic anemia is stable which will be determined by hematology. Patient is status post IVC filter placement on this admission and as per pulmonary team this  will provide good protection against recurrent PE.   Iron deficiency anemia - hemoglobin around 11 on discharge; continue ferrous sulfate on discharge   Hypokalemia - resolved   Leukocytosis - likely precipitated by transfusion; resolved at discharge   Bradycardia, sinus - stable at discharge; patient can continue digoxin and metoprolol 25 mg BID which is reduced dosage from what he was taking before 50 mg BID.  Education - patient and family are aware of plan of care and treatment  Disposition - patient is medically stable and appears clinically well to be discharged home.   Time spent on Discharge: Greater than 30 minutes  Signed: Jabriel Vanduyne 06/15/2011, 1:46 PM

## 2011-06-15 NOTE — Progress Notes (Signed)
  06/15/2011, 8:23 AM  Hospital day: 8 Antibiotics: none Steroids day ~ 7    Subjective: Patient seen with wife here, stable overnight and has had po steroids this am. Up in chair and to BR yesterday, no noted bleeding, appetite much better on steroids, no pain, no mucositis symptoms. Denies SOB with this activity, no cough/sputum now. Discussed gradual outpatient taper of steroids with patient and wife; discussed activity at home. I have rewritten note for work from Dec.3. Case discussed by phone with hospitalist yesterday, including possible DC today. Objective: Vital signs in last 24 hours: Blood pressure 126/85, pulse 60, temperature 98 F (36.7 C), temperature source Oral, resp. rate 15, height 5\' 11"  (1.803 m), weight 122 lb 2.2 oz (55.4 kg), SpO2 95.00%. Alert, looks comfortable, changes position in bed easily. Not obviously icteric; nailbeds and mucous membranes pinker. Mouth clear and moist. Lungs somewhat diminished breath sounds otherwise clear. Cor RRR. Abd soft, nontenter including epigastrium. LE no edema or cords.  Intake/Output from previous day: 12/17 0701 - 12/18 0700 In: 10 [IV Piggyback:10] Out: 2400 [Urine:2400] Intake/Output this shift:      Lab Results:  Basename 06/15/11 0400 06/14/11 0945  WBC 9.7 10.6*  HGB 11.3* 12.0*  HCT 35.0* 36.2*  PLT 111* 117*  retic 8.1% BMET  Basename 06/14/11 0945  NA 138  K 3.7  CL 102  CO2 26  GLUCOSE 96  BUN 30*  CREATININE 1.25  CALCIUM 8.9  LDH 250 Tbili from CMET yesterday down to 0.9  Studies/Results: No results found.   Assessment/Plan: 1. Hemolytic anemia: etiology not clear, tho I wonder if this could have been from levaquin used for bronchitis (started 2 wks PTA). He has responded rapidly to steroids and appears stable for DC home today with close follow up at [facility], including labs on Dec 21 and 24. My office will let patient/wife know appointment times. Please discharge on prednisone 40 mg q AM with  food, as well as protonix and mycelex troches. 2.Iron deficiency anemia diagnosed in Sept, source of blood loss not identified, now post IV iron this admission. OK to complete oral iron that he has at home. Needs endoscopy by [facility] GI outpatient. (Note UA this admission negative for blood). 3.Mild thrombocytopenia as he has had since presentation in Sept. No overt bleeding and will follow outpatient 4.Pulmonary embolus on admission, s/p IVC filter. Will consider anticoagulation depending on blood counts after DC, but would not begin this yet. 5.AAA and iliac aneurysm repair by Dr.Chen Mar 31, 2011 6.Long tobacco recently [facility], underlying COPD.  No primary MD yet. Hopefully he can be set up with primary [place], as he is also known to pulmonary and cardiology in that group.   Jasmynn Pfalzgraf P

## 2011-06-15 NOTE — Telephone Encounter (Signed)
TOLD BETH THAT MR. Monjaras HAS AN APPT. THIS Friday AT [facility] AT 1000 FOR LAB WORK.  PT. IS TO WAIT FOR RN TO TALK WITH THEM ABOUT HIS CBC AFTER DR. Darrold Span REVIEWS. APPT. 06-21-11 AT 0915 FOR LAB AND VISIT WITH ADRENA JOHNSON PA-C. BETH TO REVIEW WITH PT. AND WIFE AND PUT APPTS. ON DISCHARGE INSTRUCTIONS.

## 2011-06-15 NOTE — Progress Notes (Signed)
Patient d/c'd home per MD order. Patient and family given d/c instructions/prescriptions and all questions answered, and verbalized understanding. Patient d/c'd via wheelchair with volunteer.

## 2011-06-18 ENCOUNTER — Other Ambulatory Visit (HOSPITAL_BASED_OUTPATIENT_CLINIC_OR_DEPARTMENT_OTHER): Payer: BC Managed Care – PPO | Admitting: Lab

## 2011-06-18 ENCOUNTER — Other Ambulatory Visit: Payer: Self-pay | Admitting: Oncology

## 2011-06-18 DIAGNOSIS — D599 Acquired hemolytic anemia, unspecified: Secondary | ICD-10-CM

## 2011-06-18 DIAGNOSIS — D589 Hereditary hemolytic anemia, unspecified: Secondary | ICD-10-CM

## 2011-06-18 LAB — CBC WITH DIFFERENTIAL/PLATELET
BASO%: 0.1 % (ref 0.0–2.0)
EOS%: 1.1 % (ref 0.0–7.0)
HCT: 34.8 % — ABNORMAL LOW (ref 38.4–49.9)
LYMPH%: 17 % (ref 14.0–49.0)
MCH: 33.2 pg (ref 27.2–33.4)
MCHC: 33.2 g/dL (ref 32.0–36.0)
MONO#: 1.1 10*3/uL — ABNORMAL HIGH (ref 0.1–0.9)
MONO%: 10.7 % (ref 0.0–14.0)
NEUT%: 71.1 % (ref 39.0–75.0)
Platelets: 114 10*3/uL — ABNORMAL LOW (ref 140–400)
RBC: 3.48 10*6/uL — ABNORMAL LOW (ref 4.20–5.82)
WBC: 10.3 10*3/uL (ref 4.0–10.3)

## 2011-06-18 LAB — COMPREHENSIVE METABOLIC PANEL
ALT: 25 U/L (ref 0–53)
AST: 24 U/L (ref 0–37)
Alkaline Phosphatase: 60 U/L (ref 39–117)
Creatinine, Ser: 1.37 mg/dL — ABNORMAL HIGH (ref 0.50–1.35)
Sodium: 144 mEq/L (ref 135–145)
Total Bilirubin: 0.6 mg/dL (ref 0.3–1.2)
Total Protein: 6 g/dL (ref 6.0–8.3)

## 2011-06-18 LAB — LACTATE DEHYDROGENASE: LDH: 180 U/L (ref 94–250)

## 2011-06-18 NOTE — Progress Notes (Signed)
Spoke with pt and wife in lobby re: CBC today.  Reviewed by Dr. Darrold Span, and she states counts are stable, pt to continue on current dose of prednisone for now.  Pt states he is tolerating prednisone well, and has not had any bleeding.  Pt is taking prednisone and folic acid, per hospital d/c.  Pt's wife states pt is out of the mucinex, and she wanted to know if pt should continue.  Pt is not having any trouble breathing, but still has the cough and some congestion.  Per Dr. Darrold Span, pt to continue on mucinex then, if he still has the cough.  Pt's wife also wanted to know if it was ok for pt to take all of his am medications together, and then all of his pm medications together.  Dr. Darrold Span reviewed med list and states this is fine.  Per pt's med list, he has integra plus samples and is also on ferrous sulfate.  Per Dr. Darrold Span, pt to bring all of medications to appt on 12/24, so they can be reviewed and adjusted as necessary (pt should probably be only on one type of iron).  Pt also asked if he could drive yet.  Per Dr. Darrold Span, pt's driving is still restricted for now, and will be further discussed at visit Monday.  Pt and wife verbalize understanding of all instructions.  Adrena, PA to be made aware of above.

## 2011-06-21 ENCOUNTER — Encounter: Payer: Self-pay | Admitting: Physician Assistant

## 2011-06-21 ENCOUNTER — Other Ambulatory Visit: Payer: Self-pay | Admitting: Oncology

## 2011-06-21 ENCOUNTER — Other Ambulatory Visit: Payer: Self-pay

## 2011-06-21 ENCOUNTER — Other Ambulatory Visit (HOSPITAL_BASED_OUTPATIENT_CLINIC_OR_DEPARTMENT_OTHER): Payer: BC Managed Care – PPO | Admitting: Lab

## 2011-06-21 ENCOUNTER — Ambulatory Visit (HOSPITAL_BASED_OUTPATIENT_CLINIC_OR_DEPARTMENT_OTHER): Payer: BC Managed Care – PPO | Admitting: Physician Assistant

## 2011-06-21 DIAGNOSIS — D599 Acquired hemolytic anemia, unspecified: Secondary | ICD-10-CM

## 2011-06-21 DIAGNOSIS — R05 Cough: Secondary | ICD-10-CM

## 2011-06-21 DIAGNOSIS — D509 Iron deficiency anemia, unspecified: Secondary | ICD-10-CM

## 2011-06-21 DIAGNOSIS — D696 Thrombocytopenia, unspecified: Secondary | ICD-10-CM

## 2011-06-21 LAB — COMPREHENSIVE METABOLIC PANEL
ALT: 189 U/L — ABNORMAL HIGH (ref 0–53)
AST: 149 U/L — ABNORMAL HIGH (ref 0–37)
Albumin: 3.2 g/dL — ABNORMAL LOW (ref 3.5–5.2)
Alkaline Phosphatase: 82 U/L (ref 39–117)
BUN: 21 mg/dL (ref 6–23)
CO2: 31 mEq/L (ref 19–32)
Calcium: 8.8 mg/dL (ref 8.4–10.5)
Chloride: 103 mEq/L (ref 96–112)
Creatinine, Ser: 1.37 mg/dL — ABNORMAL HIGH (ref 0.50–1.35)
Glucose, Bld: 86 mg/dL (ref 70–99)
Potassium: 4.4 mEq/L (ref 3.5–5.3)
Sodium: 140 mEq/L (ref 135–145)
Total Bilirubin: 0.7 mg/dL (ref 0.3–1.2)
Total Protein: 6 g/dL (ref 6.0–8.3)

## 2011-06-21 LAB — CBC WITH DIFFERENTIAL/PLATELET
Basophils Absolute: 0 10*3/uL (ref 0.0–0.1)
EOS%: 0.7 % (ref 0.0–7.0)
HCT: 37 % — ABNORMAL LOW (ref 38.4–49.9)
HGB: 12.3 g/dL — ABNORMAL LOW (ref 13.0–17.1)
LYMPH%: 25 % (ref 14.0–49.0)
MCH: 33.2 pg (ref 27.2–33.4)
MCHC: 33.1 g/dL (ref 32.0–36.0)
MCV: 100.1 fL — ABNORMAL HIGH (ref 79.3–98.0)
NEUT%: 64.8 % (ref 39.0–75.0)
Platelets: 95 10*3/uL — ABNORMAL LOW (ref 140–400)
lymph#: 2.1 10*3/uL (ref 0.9–3.3)

## 2011-06-21 MED ORDER — PREDNISONE 50 MG PO TABS: ORAL_TABLET | ORAL | Status: DC

## 2011-06-21 NOTE — Progress Notes (Signed)
OFFICE PROGRESS NOTE INTERVAL HISTORY Date of Visit 05-26-11        Physicians: M.Wert, B.Chen, J.Edwards, B.Crenshaw  Patient is seen, accompanied by his wife, in follow up of his iron deficiency anemia and previous thrombocytopenia, these diagnoses made Sept 2012 when he presented with an apparent spontaneous bleed into RUE; during all of that evaluation he was also found to have a significant  AAA and iliac aneurysm. He had no splenomegaly or cirrhotic liver findings, no adenopathy, fecal occult blood was negative, no evidence of HIT, ITP, coagulopathy, no myeloma, did have moderate blood in a urinalysis. He has not had colonoscopy or urology evaluation. He stopped cigarettes that admission, 40 year smoking history. Platelets ranged from ~ 85K to 120K and Hgb ~ 10 - 10.5. The LUE bleed resolved. 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 He saw Dr.Chen last late Oct, and Dr.Wert on Nov 8. Mr.Mccardle continues to slowly improve since surgery. He continues to have a cough that is productive of white phlegm not associated with fever or chills. He continues on the Mucinex as he was instructed. He brought all of his medications today and these are verified according to our records. . Bowels have been moving and no bladder symptoms.He denies pain.  He denies any bleeding or new bruising. The large area of ecchymosis on his left upper extremity is slowly resolving. He states that his appetite is good although he still tries to get in anywhere between one and 3 and shortness daily.  Review of systems otherwise, full 10 point negative. Objective:  Vital signs in last 24 hours:  BP 133/74  Pulse 51  Temp(Src) 96.8 F (36 C) (Oral)  Wt 128 lb 3.2 oz (58.151 kg) Alert, very pleasant, looks mildly ill with intermittent cough, no labored  respirations, ambulatory without assistance   HEENT:mucous membranes moist, pharynx normal without lesions and thrush noted LymphaticsCervical, supraclavicular, and axillary nodes normal. Resp: normal percussion bilaterally, no use accessory muscles, coarse crackles lower posterior felds bilaterally and left lower anterior, no wheeze. Cardio: regular rate and rhythm GI: soft, non-tender; bowel sounds normal; no masses,  no organomegaly. Midline surgical incision well-healed and nontender. Extremities: no edema, cords or tenderness. Muscle wasting lower legs, diminished hair growth. RUE still some discoloration but no residual swelling/hematoma  Skin without rash, ecchymoses, petechiae.}    Lab Results:   [facility] 06/21/11 0946  WBC 8.4  HGB 12.3*  HCT 37.0*  PLT 95*  MCV 81.9, differential not remarkable  BMET  Basename 06/21/11 0946  NA 140  K 4.4  CL 103  CO2 31  GLUCOSE 86  BUN 21  CREATININE 1.37*  CALCIUM 8.8    Studies/Results:  No results found.  Medications: I have reviewed the patient's current medications.  Assessment/Plan:  1. Iron deficiency anemia: source not clear, tho complete work up delayed for the urgent vascular surgery. He is to complete his samples of Integra plus and then began on his ferrous fumarate 1 tablet by mouth daily. Both he and his wife voiced understanding of these instructions.  2. Thrombocytopenia: etiology not clear, tho this may have been related to the AAA. Follow counts as above. He is currently taking prednisone 50 mg by mouth daily was tolerating this dose without difficulty. Patient was discussed with Dr.[name] and we will begin to taper his steroids as follows:  40 mg by mouth daily for 3 days and then 30 mg by mouth daily until he follows up with Dr. Darrold Span 07/09/2011 with a repeat CBC differential C. met and LDH. We will check a CBC differential and C. met weekly for the next 2 weeks. A prescription for prednisone 10 mg  tablets will be sent via E. described to his pharmacy of record as he is to discontinue taking the 50 mg prednisone tablets and followed to taper as instructed. 3. persistent cough continue Mucinex followup should cough worsen or continue to persist. 4. No primary MD:  Awaiting referral to [place] primary care  5.Oral thrush: Resolved  Patient was given oral and written instructions for all of above. He was comfortable with plan.        Conni Slipper, PA-C   06/21/2011, 1:12 PM

## 2011-06-23 ENCOUNTER — Encounter (HOSPITAL_COMMUNITY): Payer: Self-pay | Admitting: Gastroenterology

## 2011-06-28 ENCOUNTER — Telehealth: Payer: Self-pay

## 2011-06-28 ENCOUNTER — Other Ambulatory Visit (HOSPITAL_BASED_OUTPATIENT_CLINIC_OR_DEPARTMENT_OTHER): Payer: BC Managed Care – PPO | Admitting: Lab

## 2011-06-28 DIAGNOSIS — D509 Iron deficiency anemia, unspecified: Secondary | ICD-10-CM

## 2011-06-28 LAB — COMPREHENSIVE METABOLIC PANEL
Albumin: 3.3 g/dL — ABNORMAL LOW (ref 3.5–5.2)
Alkaline Phosphatase: 62 U/L (ref 39–117)
BUN: 15 mg/dL (ref 6–23)
Glucose, Bld: 120 mg/dL — ABNORMAL HIGH (ref 70–99)
Potassium: 3.5 mEq/L (ref 3.5–5.3)
Total Bilirubin: 0.6 mg/dL (ref 0.3–1.2)

## 2011-06-28 LAB — CBC WITH DIFFERENTIAL/PLATELET
Basophils Absolute: 0.1 10*3/uL (ref 0.0–0.1)
Eosinophils Absolute: 0.1 10*3/uL (ref 0.0–0.5)
HCT: 39.8 % (ref 38.4–49.9)
HGB: 13.4 g/dL (ref 13.0–17.1)
LYMPH%: 23.6 % (ref 14.0–49.0)
MCV: 99 fL — ABNORMAL HIGH (ref 79.3–98.0)
MONO%: 4.2 % (ref 0.0–14.0)
NEUT#: 6.7 10*3/uL — ABNORMAL HIGH (ref 1.5–6.5)
Platelets: 79 10*3/uL — ABNORMAL LOW (ref 140–400)

## 2011-06-28 NOTE — Telephone Encounter (Signed)
SPOKE WITH MR. AND MS. Chawla AND TOLD THEM THAT ADRENA JOHNSON PA-C REVIEWED PLT. COUNT TODAY AT 79 K WHICH IS DOWN FROM 06-21-11 AT 95 K. WITH DR. MOHAMED.  HE SAID TO STAY ON PREDNISONE 30 MG DAILY AND REPEAT CBC ON 07-04-10 AS SCHEDULED.   TOLD THEM TO CALL THE OFFICE OR GO TO ED  IF THERE IS AND FRANK BLEEDING OR BRUISING.  PT. DENIES ANY BLEEDING.

## 2011-06-29 DIAGNOSIS — Z9289 Personal history of other medical treatment: Secondary | ICD-10-CM

## 2011-06-29 HISTORY — PX: ABDOMINAL AORTIC ANEURYSM REPAIR: SUR1152

## 2011-06-29 HISTORY — DX: Personal history of other medical treatment: Z92.89

## 2011-07-01 ENCOUNTER — Other Ambulatory Visit: Payer: Self-pay | Admitting: Oncology

## 2011-07-05 ENCOUNTER — Other Ambulatory Visit (HOSPITAL_BASED_OUTPATIENT_CLINIC_OR_DEPARTMENT_OTHER): Payer: BC Managed Care – PPO | Admitting: Lab

## 2011-07-05 DIAGNOSIS — D509 Iron deficiency anemia, unspecified: Secondary | ICD-10-CM

## 2011-07-05 DIAGNOSIS — D5919 Other autoimmune hemolytic anemia: Secondary | ICD-10-CM

## 2011-07-05 LAB — COMPREHENSIVE METABOLIC PANEL
Albumin: 3.5 g/dL (ref 3.5–5.2)
CO2: 29 mEq/L (ref 19–32)
Calcium: 9.2 mg/dL (ref 8.4–10.5)
Chloride: 106 mEq/L (ref 96–112)
Glucose, Bld: 131 mg/dL — ABNORMAL HIGH (ref 70–99)
Potassium: 3.8 mEq/L (ref 3.5–5.3)
Sodium: 143 mEq/L (ref 135–145)
Total Bilirubin: 0.5 mg/dL (ref 0.3–1.2)
Total Protein: 6.2 g/dL (ref 6.0–8.3)

## 2011-07-05 LAB — CBC WITH DIFFERENTIAL/PLATELET
Basophils Absolute: 0 10*3/uL (ref 0.0–0.1)
Eosinophils Absolute: 0 10*3/uL (ref 0.0–0.5)
HGB: 12.3 g/dL — ABNORMAL LOW (ref 13.0–17.1)
MCV: 93.6 fL (ref 79.3–98.0)
MONO#: 0.4 10*3/uL (ref 0.1–0.9)
MONO%: 3.7 % (ref 0.0–14.0)
NEUT#: 8.7 10*3/uL — ABNORMAL HIGH (ref 1.5–6.5)
RDW: 16.4 % — ABNORMAL HIGH (ref 11.0–14.6)

## 2011-07-06 ENCOUNTER — Telehealth: Payer: Self-pay

## 2011-07-06 NOTE — Telephone Encounter (Signed)
SPOKE WITH MR. Jubb AND TOLD HIM THAT HIS BLOOD COUNTS LOOKED A LITTLE BETTER ON 07-05-11 PER DR. Darrold Span.   HIS CHEMISTRIES LOOKED GOOD EXCEPT THAT HE MAY NOT BE DRINKING ENOUGH FLUIDS.  DR. Darrold Span WANTS HIM TO INCREASE HIS PO FLUID INTAKE AND KEEP APPT. 07-09-11 AS SCHEDULED.   SUGGESTED TO MR. Hawkes TO DRINK 8 OZ OF FLUID EVERY 1-1/2 HRS WHILE AWAKE. HE IS TO DECREASE THE PREDNISONE DOSE TO 20 MG PER DAY BEGINNING 07-07-11.  PT. VERBALIZED UNDERSTANDING.

## 2011-07-09 ENCOUNTER — Other Ambulatory Visit: Payer: Self-pay

## 2011-07-09 ENCOUNTER — Ambulatory Visit (HOSPITAL_BASED_OUTPATIENT_CLINIC_OR_DEPARTMENT_OTHER): Payer: BC Managed Care – PPO | Admitting: Oncology

## 2011-07-09 VITALS — BP 138/76 | HR 58 | Temp 97.1°F | Ht 71.0 in | Wt 126.3 lb

## 2011-07-09 DIAGNOSIS — B370 Candidal stomatitis: Secondary | ICD-10-CM

## 2011-07-09 DIAGNOSIS — R001 Bradycardia, unspecified: Secondary | ICD-10-CM

## 2011-07-09 DIAGNOSIS — J449 Chronic obstructive pulmonary disease, unspecified: Secondary | ICD-10-CM

## 2011-07-09 DIAGNOSIS — I729 Aneurysm of unspecified site: Secondary | ICD-10-CM

## 2011-07-09 DIAGNOSIS — D696 Thrombocytopenia, unspecified: Secondary | ICD-10-CM

## 2011-07-09 MED ORDER — METOPROLOL TARTRATE 50 MG PO TABS: 25.0000 mg | ORAL_TABLET | Freq: Two times a day (BID) | ORAL | Status: DC

## 2011-07-09 MED ORDER — CLOTRIMAZOLE 10 MG MT LOZG: LOZENGE | OROMUCOSAL | Status: DC

## 2011-07-09 MED ORDER — FOLIC ACID 1 MG PO TABS: 1.0000 mg | ORAL_TABLET | Freq: Every day | ORAL | Status: DC

## 2011-07-09 MED ORDER — DIGOXIN 125 MCG PO TABS: 125.0000 ug | ORAL_TABLET | Freq: Every day | ORAL | Status: DC

## 2011-07-09 MED ORDER — FLUCONAZOLE 100 MG PO TABS: ORAL_TABLET | ORAL | Status: DC

## 2011-07-09 NOTE — Patient Instructions (Signed)
Continue prednisone 20 mg each am with food until we check labs again on Jan 14.  We will let you know then if prednisone dose will be decreased further.   Diflucan (fluconazole) 100 mg:  2 today then 1 daily for thrush.  After you finish the diflucan, begin mycelex troches 3 times daily to keep thrush from coming back  Metoprolol 25 mg :  1/2 tablet twice daily  Digoxin 125 :  1 tablet daily  Drink lots of fluids    Dr.Avram [facility] [place].  March 4 at 3 pm.   [phone]

## 2011-07-10 NOTE — Progress Notes (Signed)
OFFICE PROGRESS NOTE Date of Visit:  Jul 09, 2011 Physicians:  M.Wert, B.Chen, J.Edwards, B.Crenshaw  INTERVAL HISTORY:   Patient is seen, together with wife, in continuing attention to his hematologic problems, still on prednisone + folate for recent AIHA and on iron for documented iron deficiency anemia. He also has had some degree of thrombocytopenia since at least fall 2012. Patient presented Sept 2012 with apparent  spontaneous bleed into his right upper extremity; he had not had any  medical care in over 10 years when he presented then. CBC on March 08, 2011 without priors for  Comparison.:White count 6.8, hemoglobin 11, platelets 100,000. His  platelets dropped to 84,000 during that hospitalization then improved to  124,000 by discharge. The thrombocytopenia was thought possibly related to low grade DIC from the large AAA.  The evaluation included CT abdomen and pelvis  March 10, 2011 with normal size spleen, no obvious cirrhotic  changes of the liver and no significant adenopathy. HIT screen was  negative. This was not obviously ITP, no evidence of DIC, no Von  Willebrand, and no myeloma that is apparent. He was iron deficient with  fecal occult blood negative x1 and he was begun on oral iron during that  admission. LDH was normal at 188 and Tbili was 0.5.He had some blood in urinalysis in  September. The right upper extremity bleed resolved. 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  He was admitted again  Dec 11 - 18, 2012 with pulmonary embolus, new hemolytic anemia, and platelets 112 - 140K. He had IVC filter placed and has not been on anticoagulation since then. He was transfused PRBCs, without difficulty, for Hgb below 5. He had been on levaquin for bronchitis PTA, which could have been  cause of hemolytic anemia. He was begun on steroids, which have been gradually tapered. He was seen in hospital by [facility], hemoccult negative (and UA without blood), with plans for colonoscopy when stable outpatient.                            [name] has been on prednisone 30 mg daily since 12-27, not decreased further with lab check 12-31 due to drop in platelets then to 79K without bleeding. He has felt progressively stronger overall, with much better appetite. He has some cough not presently productive, no fever, no SOB walking full length of office today, no LE swelling, no noted bleeding, bowels ok. He is able to sleep. He is not aware of thrush symptoms. Review of Systems otherwise: no discomfort at surgical incisions or LE. No other pain. Remainder of 10 point ROS negative.  [name] is still not smoking. I have stressed importance of this again. We have discussed COPD changes noted on imaging studies.  Objective:  Vital signs in last 24 hours:  BP 138/76  Pulse 58  Temp(Src) 97.1 F (36.2 C) (Oral)  Ht 5\' 11"  (1.803 m)  Wt 126 lb 4.8 oz (57.289 kg)  BMI 17.62 kg/m2 This weight is down 1 1/2 lbs from 12-24. Respirations not labored RA.  Alert, very pleasant and appropriate, easily ambulatory. Wife very helpful.  HEENT:mucous membranes moist, pharynx normal without lesions and Mucous membranes moist, pink. Dentures in. Significant thrush of tongue and posterior pharynx. PERRL.  Not obviously icteric. LymphaticsCervical, supraclavicular, and axillary nodes normal. and otherwise normal Resp: normal percussion bilaterally. Diminished breath sounds thruout, no wheeze or rales. No cough during visit. Cardio: clear HS, slightly irregular GI: soft, non-tender; bowel sounds normal; no masses,  no organomegaly Extremities: extremities normal, atraumatic, no cyanosis or edema Skin without bruises or petechiae Neuro nonfocal  Lab Results: CBC 07-05-11 WBC 9.8  ANC 8.7 on steroids,  plt  85K                       MCV 93    BMET CMET 07-05-11  Glucose 131 on steroids, creat 1.3, Tbili 0.5  Studies/Results:  No results found.  Medications: I have reviewed the patient's current medications. There is discrepancy between DC med list and the label instructions on his metoprolol and digoxin, and I have given patient and wife written and oral instructions for use as in DC instructions (Dig 125 daily and metoprolol 25 mg 1/2 tab bid). I have given him new prescriptions for diflucan x 1 week then mycelex troches tid while on steroids. I have rewritten digoxin and metoprolol (no cardiology appointment scheduled and no primary MD yet) and rewritten the folate.  I have called [facility] and have been able to schedule him as a new patient to Dr.Keonta Jonny Holmes on March 4 @ 3pm, this as he is already established with [facility]. Patient and wife are in full agreement with this referral.  Assessment/Plan:  1. Thrombocytopenia: variable counts since fall 2012, higher by last labs and not bleeding. Following. Etiology not clear. 2. New hemolytic anemia in Dec., improved/ may be resolved. Will taper prednisone based on counts pending 1-14. ? Related to levaquin. 3.Iron deficiency anemia improved, on oral iron, source of blood loss not clear. He had microscopic hematuria x 1 in ~ Sept with clear UA subsequently and has had negative hemoccults in hospital. [facility] plans colonoscopy, not yet scheduled 4.Pulmonary emboli in Dec. With IVC filter placed. 5. AAA and iliac aneurysm repair Oct. 2012 6. Long past tobacco, COPD 7. Oral candida from prednisone, diflucan as above, then mycelex   He will have labs at [facility] 1-14 and will see me again 1-23 with labs, or sooner if needed.      [name] P, MD   07/10/2011, 10:29 AM

## 2011-07-11 ENCOUNTER — Other Ambulatory Visit: Payer: Self-pay | Admitting: Vascular Surgery

## 2011-07-12 ENCOUNTER — Telehealth: Payer: Self-pay

## 2011-07-12 ENCOUNTER — Other Ambulatory Visit: Payer: BC Managed Care – PPO

## 2011-07-12 NOTE — Telephone Encounter (Signed)
LM FOR PATIENT TO CALL OUR OFFICE TOMORROW TO RESCHEDULE MISSED LAB APPT. TO DAY TO CHECK COUNTS TO SEE IF HIS PREDNISONE DOSE CAN BE TAPERED DOWN.

## 2011-07-13 ENCOUNTER — Other Ambulatory Visit (HOSPITAL_BASED_OUTPATIENT_CLINIC_OR_DEPARTMENT_OTHER): Payer: BC Managed Care – PPO | Admitting: Lab

## 2011-07-13 DIAGNOSIS — D509 Iron deficiency anemia, unspecified: Secondary | ICD-10-CM

## 2011-07-13 LAB — COMPREHENSIVE METABOLIC PANEL
Albumin: 3.2 g/dL — ABNORMAL LOW (ref 3.5–5.2)
BUN: 19 mg/dL (ref 6–23)
Calcium: 9.6 mg/dL (ref 8.4–10.5)
Chloride: 102 mEq/L (ref 96–112)
Glucose, Bld: 121 mg/dL — ABNORMAL HIGH (ref 70–99)
Potassium: 3.8 mEq/L (ref 3.5–5.3)

## 2011-07-13 LAB — CBC WITH DIFFERENTIAL/PLATELET
Basophils Absolute: 0 10*3/uL (ref 0.0–0.1)
EOS%: 0 % (ref 0.0–7.0)
HCT: 37.2 % — ABNORMAL LOW (ref 38.4–49.9)
HGB: 12.7 g/dL — ABNORMAL LOW (ref 13.0–17.1)
MCH: 31.4 pg (ref 27.2–33.4)
MONO#: 0.2 10*3/uL (ref 0.1–0.9)
NEUT%: 87.5 % — ABNORMAL HIGH (ref 39.0–75.0)
lymph#: 0.9 10*3/uL (ref 0.9–3.3)

## 2011-07-14 ENCOUNTER — Telehealth: Payer: Self-pay

## 2011-07-14 NOTE — Telephone Encounter (Signed)
TOLD [name] THAT HIS PLTS. WERE GOOD AT 88K YESTERDAY.  DR. Darrold Span SAID THAT HE CAN DECREASE HIS DOSE OF PREDNISONE TO 10 MG DAILY FROM 20 MG.  HE WILL BEGIN THIS TODAY AS HE HAS NOT TAKEN THE MEDICATION FOR TODAY.  HE WILL KEEP HIS APPT.  FOR 07-21-11 WITH DR. Darrold Span.

## 2011-07-16 ENCOUNTER — Other Ambulatory Visit: Payer: Self-pay

## 2011-07-16 DIAGNOSIS — B370 Candidal stomatitis: Secondary | ICD-10-CM

## 2011-07-16 NOTE — Telephone Encounter (Signed)
ENCOUNTER OPENED IN ERROR

## 2011-07-21 ENCOUNTER — Ambulatory Visit (HOSPITAL_BASED_OUTPATIENT_CLINIC_OR_DEPARTMENT_OTHER): Payer: BC Managed Care – PPO | Admitting: Oncology

## 2011-07-21 ENCOUNTER — Telehealth: Payer: Self-pay | Admitting: Oncology

## 2011-07-21 ENCOUNTER — Other Ambulatory Visit (HOSPITAL_BASED_OUTPATIENT_CLINIC_OR_DEPARTMENT_OTHER): Payer: BC Managed Care – PPO | Admitting: Lab

## 2011-07-21 DIAGNOSIS — Z86711 Personal history of pulmonary embolism: Secondary | ICD-10-CM

## 2011-07-21 DIAGNOSIS — D509 Iron deficiency anemia, unspecified: Secondary | ICD-10-CM

## 2011-07-21 DIAGNOSIS — D696 Thrombocytopenia, unspecified: Secondary | ICD-10-CM

## 2011-07-21 DIAGNOSIS — R3129 Other microscopic hematuria: Secondary | ICD-10-CM

## 2011-07-21 LAB — IRON AND TIBC
Iron: 119 ug/dL (ref 42–165)
UIBC: 72 ug/dL — ABNORMAL LOW (ref 125–400)

## 2011-07-21 LAB — CBC WITH DIFFERENTIAL/PLATELET
Basophils Absolute: 0.1 10*3/uL (ref 0.0–0.1)
EOS%: 0.5 % (ref 0.0–7.0)
Eosinophils Absolute: 0.1 10*3/uL (ref 0.0–0.5)
MCHC: 34 g/dL (ref 32.0–36.0)
MCV: 92.2 fL (ref 79.3–98.0)
MONO#: 0.5 10*3/uL (ref 0.1–0.9)
NEUT%: 71.1 % (ref 39.0–75.0)
Platelets: 87 10*3/uL — ABNORMAL LOW (ref 140–400)
RBC: 4.21 10*6/uL (ref 4.20–5.82)

## 2011-07-21 LAB — URINALYSIS, MICROSCOPIC - CHCC
Glucose: NEGATIVE g/dL
Ketones: NEGATIVE mg/dL
Nitrite: NEGATIVE
Specific Gravity, Urine: 1.02 (ref 1.003–1.035)

## 2011-07-21 LAB — FERRITIN: Ferritin: 2489 ng/mL — ABNORMAL HIGH (ref 22–322)

## 2011-07-21 NOTE — Patient Instructions (Signed)
Starting Jan 24, decrease prednisone to 1/2 tablet (= 5mg ) each am with breakfast for one week, then stop prednisone.  We will check your blood counts at Dr.[name]'s office first week in February with you off the prednisone, then see Dr.[name] again in late Feb.  You have some blood in urine seen under microscope, as you had once before in September. We will ask urologist to see you.

## 2011-07-21 NOTE — Telephone Encounter (Signed)
Pt will be seen be seen by Dr Isabel Caprice on 08/12/11 0815am, pt aware of appt. Filled out a form taken to HIM for referral

## 2011-07-22 ENCOUNTER — Encounter: Payer: Self-pay | Admitting: Vascular Surgery

## 2011-07-23 ENCOUNTER — Other Ambulatory Visit (INDEPENDENT_AMBULATORY_CARE_PROVIDER_SITE_OTHER): Payer: BC Managed Care – PPO | Admitting: *Deleted

## 2011-07-23 ENCOUNTER — Ambulatory Visit (INDEPENDENT_AMBULATORY_CARE_PROVIDER_SITE_OTHER): Payer: BC Managed Care – PPO | Admitting: *Deleted

## 2011-07-23 ENCOUNTER — Encounter: Payer: Self-pay | Admitting: Vascular Surgery

## 2011-07-23 ENCOUNTER — Ambulatory Visit (INDEPENDENT_AMBULATORY_CARE_PROVIDER_SITE_OTHER): Payer: BC Managed Care – PPO | Admitting: Vascular Surgery

## 2011-07-23 VITALS — BP 159/97 | HR 65 | Resp 15 | Ht 71.0 in | Wt 125.0 lb

## 2011-07-23 DIAGNOSIS — I739 Peripheral vascular disease, unspecified: Secondary | ICD-10-CM

## 2011-07-23 DIAGNOSIS — I714 Abdominal aortic aneurysm, without rupture, unspecified: Secondary | ICD-10-CM

## 2011-07-23 DIAGNOSIS — Z9889 Other specified postprocedural states: Secondary | ICD-10-CM

## 2011-07-23 DIAGNOSIS — Z95828 Presence of other vascular implants and grafts: Secondary | ICD-10-CM | POA: Insufficient documentation

## 2011-07-23 DIAGNOSIS — Z48812 Encounter for surgical aftercare following surgery on the circulatory system: Secondary | ICD-10-CM

## 2011-07-23 NOTE — Progress Notes (Signed)
[facility] [place]  Established Abdominal Aortic Aneurysm  History of Present Illness  The patient is a 77 y.o. male who presents with chief complaint: follow up from Aobifem BPG.  The patient does not have back or abdominal pain.  He was recently hospitalized for acute onset of anemia felt to be of hemolytic etiology.  The patient notes his appetite has improved.  He notes his energy level is improved.  The patient's PMH, PSH, SH, FamHx, Med, Allergies and ROS are unchanged from 03/30/11 except episode of hemolytic anemia and episode of hematuria.  Physical Examination  Filed Vitals:   07/23/11 1045  BP: 159/97  Pulse: 65  Resp: 15  Height: 5\' 11"  (1.803 m)  Weight: 125 lb (56.7 kg)  SpO2: 96%   Body mass index is 17.43 kg/(m^2).  General: A&O x 3, WDWN, Cachectic   Pulmonary: Sym exp, good air movt, CTAB, no rales, rhonchi, & wheezing  Cardiac: RRR, Nl S1, S2, no Murmurs, rubs or gallops  Vascular: Vessel Right Left  Radial Palpable Palpable  Brachial Palpable Palpable  Carotid Palpable, without bruit Palpable, without bruit  Aorta Non-palpable N/A  Femoral Palpable Palpable  Popliteal Non-palpable Non-palpable  PT Non-Palpable Non-Palpable  DP Non-Palpable Non-Palpable   Gastrointestinal: soft, NTND, -G/R, - HSM, - masses, - CVAT B, midline incision well healed, B groin incisions well healed  Musculoskeletal: M/S 5/5 throughout , Extremities without ischemic changes   Neurologic: Pain and light touch intact in extremities , Motor exam as listed above  Non-Invasive Vascular Imaging  Aortic graft duplex (Date: 07/22/10)  R AT and PT: biphasic, TBI 0.42  L AT: monophasic, L PT: biphasic, TBI 0.40  Patent aortobifemoral bypass widely patent  253 c/s PSV in R CFA/PFA  Distal aortic aneurysm dilation noted   Medical Decision Making  The patient is a 77 y.o. male who presents with: s/p Aobifemoral bypass  This patient's Aobifemoral  bypass remains widely patent  I would not pursue the right CFA/PFA stenosis at this time and the distal aortic aneurysm is impossible as the entire abdominal aorta was opened longitudinally to accomodate the aortobifemoral graft.  The sac is closed over the graft, which is what was likely visualized.  I will reimage the patient at 6 months with ABI and Aortoiliac duplex   Thank you for allowing [place] to participate in this patient's care.  [name], MD [facility] [place] Office: [phone] Pager: [phone]  07/23/2011, 2:49 PM

## 2011-07-26 NOTE — Progress Notes (Signed)
OFFICE PROGRESS NOTE Date of Visit 07-21-11 Physicians M.Wert, B.Chen, J.Edwards, B.Crenshaw,  Also new patient appt pending with Dr.Aundra John for 08-30-11  INTERVAL HISTORY:   Patient is seen, alone for visit today, in scheduled follow up of his hematologic problems, specifically recent AIHA on steroid taper, iron deficiency anemia and mild thrombocytopenia.   Patient presented Sept 2012 with apparent  spontaneous bleed into his right upper extremity; he had not had any  medical care in over 10 years when he presented then. CBC on March 08, 2011 without priors for  Comparison.:White count 6.8, hemoglobin 11, platelets 100,000. His  platelets dropped to 84,000 during that hospitalization then improved to  124,000 by discharge. The thrombocytopenia was thought possibly related to low grade DIC from the large AAA. The evaluation included CT abdomen and pelvis  March 10, 2011 with normal size spleen, no obvious cirrhotic  changes of the liver and no significant adenopathy. HIT screen was  negative. This was not obviously ITP, no evidence of DIC, no Von  Willebrand, and no myeloma that is apparent. He was iron deficient with  fecal occult blood negative x1 and he was begun on oral iron during that  admission. LDH was normal at 188 and Tbili was 0.5.He had some blood in urinalysis in  September. The right upper extremity bleed resolved.  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 He was admitted again Dec 11 - 18, 2012 with pulmonary embolus, new hemolytic anemia, and platelets 112 - 140K. He had IVC filter placed and has not been on anticoagulation since then. He was transfused PRBCs, without difficulty, for Hgb below 5. He had been on levaquin for bronchitis PTA, which could have been cause of hemolytic  anemia. He was begun on steroids, which have been gradually tapered. He was seen in hospital by [facility], hemoccult negative (and UA without blood), with plans for colonoscopy when stable outpatient.  Mr.Tozer has progressively improved since most recent hospitalization. Prednisone has been at 10 mg /d since 07-14-11. He has had fairly good energy level altho this is not back to energy prior to illness in Sept,  is able to sleep, very little cough/ minimal sputum, no increased shortness of breath, no fever or symptoms of infection, no bladder symptoms, no noted bleeding, good appetite, no symptoms of gastric irritation or mucositis with steroids, no pain. Remainder of 10 point ROS negative.   Objective:  Vital signs in last 24 hours:  BP 150/86  Pulse 73  Temp(Src) 96.7 F (35.9 C) (Oral)  Ht 5\' 11"  (1.803 m)  Wt 127 lb 9.6 oz (57.879 kg)  BMI 17.80 kg/m2  Alert, appropriate, ambulatory without assistance, just delightful as always. Respirations not labored RA, no coughing.  HEENT:mucous membranes moist, pharynx normal without lesions LymphaticsCervical, supraclavicular, and axillary nodes normal. Resp: hyperresonant to percussion, few scattered crackles, no wheezes, generally somewhat diminished breath sounds. Cardio: occasionally irregular, no gallop GI: soft, non-tender; bowel sounds normal; no masses,  no organomegaly Extremities: extremities normal, atraumatic, no cyanosis or edema. No swelling RUE where he had bleed in Sept '12. Skin without rash, petechiae, ecchymosis    Lab Results: CBC  WBC on steroids 9.7, ANC 6.9, Hgb 13.2, MCV 92, plt 87K, RDW 15.6   Differential normal Iron on po supplement 119, %sat 62, ferritin 2400. Iron  studies had been low in hospital and these may reflect the po iron.  UA repeated today again with small blood, as he had intermittently during hospitalizations.  BMET CMET/LDH 07-13-11 : glucose 121, bili 0.4, alb 3.2 and LDH down to 185, remainder of  full CMET WNL  Studies/Results:  No results found.  Medications: I have reviewed the patient's current medications. He has been given written and oral instructions to decrease prednisone to 5 mg daily beginning 07-22-11 x 1 week then DC. Assessment/Plan:  1. Bleed, apparently spontaneous, into RUE Sept '12, with mild thrombocytopenia. Etiology of these not clea, initially low plt thought related to some DIC with large AAA. Marland Kitchen Platelets stable without significant bleeding. Follow as he comes off prednisone 2.AIHA Dec.'12: clinically resolved, complete taper and DC of prednisone as above. This may have been related to levaquin 3.AAA repair and repair of iliac artery aneurysm urgently in Nov.'12.  Doing well. 4.Pulmonary embolus Dec.with IVC filter placed. Not on anticoagulation. 5.Long tobacco recently DCd, significant underlying COPD 6.microscopic hematuria with iron deficiency anemia previously: referral to urology now 7.iron deficiency anemia previously: microscopic hematuria, no positive hemoccults but has not had endoscopy. Known to [facility] from consults in hospital when he was otherwise not stable for endoscopy. Referral back to GI. 8. Arrhythmias in hospital, now on digoxin and lopressor. 9. No medical care/ no primary MD in > 10 years when he presented in Sept '12. He will see Dr.J.John as new patient in early March We will repeat CBC first week in Feb and I will see him back with labs late Feb, or sooner if needed.        [name],LENNIS P, MD   07/26/2011, 3:04 PM

## 2011-07-29 ENCOUNTER — Other Ambulatory Visit: Payer: Self-pay | Admitting: Vascular Surgery

## 2011-07-29 NOTE — Procedures (Unsigned)
BYPASS GRAFT EVALUATION  INDICATION:  Aortobifem bypass graft.  HISTORY: Diabetes:  No Cardiac:  No Hypertension:  No Smoking:  Yes Previous Surgery:  Aortobifem bypass graft on 03/30/2011  SINGLE LEVEL ARTERIAL EXAM                              RIGHT              LEFT Brachial: Anterior tibial: Posterior tibial: Peroneal: Ankle/brachial index:  PREVIOUS ABI:  Date:  RIGHT:  LEFT:  LOWER EXTREMITY BYPASS GRAFT DUPLEX EXAM:  DUPLEX: 1. Biphasic Doppler waveforms noted throughout the aortobifem bypass     graft, with no internally increased velocities. 2. Velocity of 253 cm/sec noted in the right common femoral/proximal     profunda femoral artery region.  IMPRESSION: 1. Patent aortobifem bypass graft, with no evidence of stenosis. 2. Elevated velocity noted in the right common femoral/proximal     profunda femoral artery region, as described above. 3. Aneurysmal dilatation of the distal abdominal aorta noted, with     diameter measurements of 3.2 x 3.1 cm. 4. Bilateral ankle-brachial indices are noted on a separate report.  ___________________________________________ Fransisco Hertz, MD  CH/MEDQ  D:  07/23/2011  T:  07/23/2011  Job:  [id number]

## 2011-08-02 ENCOUNTER — Telehealth: Payer: Self-pay

## 2011-08-02 ENCOUNTER — Other Ambulatory Visit (HOSPITAL_BASED_OUTPATIENT_CLINIC_OR_DEPARTMENT_OTHER): Payer: BC Managed Care – PPO

## 2011-08-02 LAB — CBC WITH DIFFERENTIAL/PLATELET
BASO%: 0.3 % (ref 0.0–2.0)
HCT: 39.8 % (ref 38.4–49.9)
LYMPH%: 16 % (ref 14.0–49.0)
MCH: 31.6 pg (ref 27.2–33.4)
MCHC: 34 g/dL (ref 32.0–36.0)
MCV: 93 fL (ref 79.3–98.0)
MONO#: 0.4 10*3/uL (ref 0.1–0.9)
NEUT%: 79.9 % — ABNORMAL HIGH (ref 39.0–75.0)
Platelets: 90 10*3/uL — ABNORMAL LOW (ref 140–400)
WBC: 9.7 10*3/uL (ref 4.0–10.3)

## 2011-08-02 NOTE — Telephone Encounter (Signed)
SPOKE WITH [name] AND TOLD HIM THAT HIS PLTS. WERE GOOD TODAY AT 90 K.  DECREASE PREDNISONE TO 5 MG EVERY OTHER DAY THIS WEEK AND NEXT.  STOP ON 08-13-11.  KEEP APPT. WITH DR. Darrold Span AS SCHEDULED ON 08-20-11 PER DR. Darrold Span. PT. VERBALIZED UNDERSTANDING.

## 2011-08-20 ENCOUNTER — Ambulatory Visit: Payer: BC Managed Care – PPO | Admitting: Oncology

## 2011-08-20 ENCOUNTER — Other Ambulatory Visit (HOSPITAL_BASED_OUTPATIENT_CLINIC_OR_DEPARTMENT_OTHER): Payer: BC Managed Care – PPO | Admitting: Lab

## 2011-08-20 ENCOUNTER — Encounter: Payer: Self-pay | Admitting: Oncology

## 2011-08-20 ENCOUNTER — Telehealth: Payer: Self-pay | Admitting: Oncology

## 2011-08-20 VITALS — BP 144/93 | HR 83 | Temp 97.2°F | Ht 71.0 in | Wt 129.9 lb

## 2011-08-20 DIAGNOSIS — D649 Anemia, unspecified: Secondary | ICD-10-CM

## 2011-08-20 DIAGNOSIS — D696 Thrombocytopenia, unspecified: Secondary | ICD-10-CM

## 2011-08-20 LAB — COMPREHENSIVE METABOLIC PANEL
BUN: 10 mg/dL (ref 6–23)
CO2: 25 mEq/L (ref 19–32)
Calcium: 9.2 mg/dL (ref 8.4–10.5)
Chloride: 106 mEq/L (ref 96–112)
Creatinine, Ser: 1.27 mg/dL (ref 0.50–1.35)
Glucose, Bld: 112 mg/dL — ABNORMAL HIGH (ref 70–99)

## 2011-08-20 LAB — CBC WITH DIFFERENTIAL/PLATELET
BASO%: 0.5 % (ref 0.0–2.0)
EOS%: 2.4 % (ref 0.0–7.0)
HCT: 37.7 % — ABNORMAL LOW (ref 38.4–49.9)
LYMPH%: 33.9 % (ref 14.0–49.0)
MCH: 31 pg (ref 27.2–33.4)
MCHC: 33.8 g/dL (ref 32.0–36.0)
NEUT%: 55.6 % (ref 39.0–75.0)
lymph#: 3.3 10*3/uL (ref 0.9–3.3)

## 2011-08-20 LAB — MORPHOLOGY: PLT EST: DECREASED

## 2011-08-20 NOTE — Progress Notes (Signed)
OFFICE PROGRESS NOTE Date of Visit 08-20-2011 Physicians: B.Chen, D.Grapey, [facility], (J.John upcoming)  INTERVAL HISTORY:   Patient is seen, alone for visit today, in continuing attention to his hematologic problems, specifically AIHA, iron deficiency anemia and mild thrombocytopenia.  Patient presented Sept 2012 with apparent  spontaneous bleed into his right upper extremity; he had not had any  medical care in over 10 years when he presented then. CBC on March 08, 2011 without priors for  Comparison.:White count 6.8, hemoglobin 11, platelets 100,000. His  platelets dropped to 84,000 during that hospitalization then improved to  124,000 by discharge. The thrombocytopenia was thought possibly related to low grade DIC from the large AAA. The evaluation included CT abdomen and pelvis  March 10, 2011 with normal size spleen, no obvious cirrhotic  changes of the liver and no significant adenopathy. HIT screen was  negative. This was not obviously ITP, no evidence of DIC, no Von  Willebrand, and no myeloma that is apparent. He was iron deficient with  fecal occult blood negative x1 and he was begun on oral iron during that  admission. LDH was normal at 188 and Tbili was 0.5.He had some blood in urinalysis in  September. The right upper extremity bleed resolved.  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 He was admitted again Dec 11 - 18, 2012 with pulmonary embolus, new hemolytic anemia, and platelets 112 - 140K. He had IVC filter placed and has not been on anticoagulation since then. He was transfused PRBCs, without difficulty, for Hgb below 5. He had been on levaquin for bronchitis PTA, which could have been cause of hemolytic anemia. The hemolytic anemia responded to steroids, prednisone then tapered off  as of ~ 07-28-2011. He was seen in hospital by [facility], hemoccult negative (and UA without blood), with plans for colonoscopy when stable outpatient. I do not believe apt is set up as yet with GI. He has now been seen by Dr.Grapey, with his initial exam not remarkable and plans for cystoscopy upcoming (history of long tobacco and some microscopic hematuria). Mr.Wilner continues oral iron and folate.  Patient tells me that he has been feeling very well since here last. He denies any increased shortness of breath or symptoms from anemia and has not seen any bleeding. His appetite is good. He denies LE swelling or pain. He has had no fever or symptoms of infection. 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 Remainder of 10 point Review of Systems negative.  Objective:  Vital signs in last 24 hours:  BP 144/93  Pulse 83  Temp(Src) 97.2 F (36.2 C) (Oral)  Ht 5\' 11"  (1.803 m)  Wt 129 lb 14.4 oz (58.922 kg)  BMI 18.12 kg/m2  Alert, easily ambulatory, looks comfortable, no cough and respirations not labored RA.  HEENT:mucous membranes moist, pharynx normal without lesions. PERRL, not icteric.No JVD. LymphaticsCervical, supraclavicular, and axillary nodes normal. Resp: hyperresonant to percussion and somewhat diminished breath sounds bilaterally, no wheezes or rales, no rubs. Cardio: regular rate and rhythm GI: soft, non-tender; bowel sounds normal; no masses,  no organomegaly Extremities: extremities normal, atraumatic, no cyanosis or edema. No residual swelling RUE where he had apparent spontaneous bleed around elbow at initial presentation. Skin without rash or petechiae/ecchymosis.  Lab Results:  Basename 08/20/11 0908  WBC 9.7  HGB 12.7*  HCT 37.7*  PLT 101*   RDW 15, ANC 5.4, MCV 91. Some large platelets called. BMET  Basename 08/20/11 0908  NA 142  K 3.6  CL 106  CO2 25  GLUCOSE 112*  BUN 10  CREATININE  1.27  CALCIUM 9.2   Remainder of CMET with Tbili 0.5 and otherwise normal Studies/Results:  No results found.  Medications: I have reviewed the patient's current medications.  Assessment/Plan:  1. Anemia: now off steroids for AIHA diagnosed in Dec. He was initially iron deficient, continues oral iron; source of iron deficiency not clear, urologic evaluation in process and has not had endoscopy (heme negative stool). He does have chronic illness and some renal insufficiency which are likely contributing.  2.Thrombocytopenia: variable counts, no bleeding 3.urgent aortobifem bypass graft and legation of left common iliac artery aneurysm Oct  2012 4.Pulmonary embolus Dec 2012, IVC filter in 5.Long tobacco abuse, hopefully still discontinued.  He has not had bone marrow exam, which may be helpful at some point. I will see him back in ~ 4 wks with CBC/CMET/LDH/haptoglobin then.   Note he has new patient visit with Dr.Elmor John in early March, to establish primary care.      Kurt Hoffmeier P, MD   08/20/2011, 8:16 PM

## 2011-08-20 NOTE — Telephone Encounter (Signed)
gv pt appt schedule for march °

## 2011-08-20 NOTE — Patient Instructions (Signed)
Do not need to use clotrimazole troches now.  Keep apts with Dr. Oliver Barre and Dr.David Isabel Caprice as scheduled.

## 2011-08-27 ENCOUNTER — Encounter: Payer: Self-pay | Admitting: Internal Medicine

## 2011-08-27 DIAGNOSIS — Z0000 Encounter for general adult medical examination without abnormal findings: Secondary | ICD-10-CM | POA: Insufficient documentation

## 2011-08-30 ENCOUNTER — Ambulatory Visit (INDEPENDENT_AMBULATORY_CARE_PROVIDER_SITE_OTHER): Payer: BC Managed Care – PPO | Admitting: Internal Medicine

## 2011-08-30 ENCOUNTER — Other Ambulatory Visit: Payer: Self-pay | Admitting: Vascular Surgery

## 2011-08-30 ENCOUNTER — Encounter: Payer: Self-pay | Admitting: Internal Medicine

## 2011-08-30 ENCOUNTER — Telehealth: Payer: Self-pay

## 2011-08-30 ENCOUNTER — Other Ambulatory Visit (INDEPENDENT_AMBULATORY_CARE_PROVIDER_SITE_OTHER): Payer: BC Managed Care – PPO

## 2011-08-30 VITALS — BP 144/102 | HR 94 | Temp 97.0°F | Ht 71.0 in | Wt 130.5 lb

## 2011-08-30 DIAGNOSIS — E0590 Thyrotoxicosis, unspecified without thyrotoxic crisis or storm: Secondary | ICD-10-CM

## 2011-08-30 DIAGNOSIS — E785 Hyperlipidemia, unspecified: Secondary | ICD-10-CM

## 2011-08-30 DIAGNOSIS — I10 Essential (primary) hypertension: Secondary | ICD-10-CM

## 2011-08-30 DIAGNOSIS — Z0000 Encounter for general adult medical examination without abnormal findings: Secondary | ICD-10-CM

## 2011-08-30 HISTORY — DX: Hyperlipidemia, unspecified: E78.5

## 2011-08-30 LAB — URINALYSIS, ROUTINE W REFLEX MICROSCOPIC
Ketones, ur: NEGATIVE
Urine Glucose: NEGATIVE
Urobilinogen, UA: 0.2 (ref 0.0–1.0)

## 2011-08-30 LAB — LIPID PANEL
Cholesterol: 188 mg/dL (ref 0–200)
LDL Cholesterol: 133 mg/dL — ABNORMAL HIGH (ref 0–99)
Total CHOL/HDL Ratio: 5

## 2011-08-30 LAB — PSA: PSA: 0.58 ng/mL (ref 0.10–4.00)

## 2011-08-30 MED ORDER — ASPIRIN 81 MG PO TBEC: 81.0000 mg | DELAYED_RELEASE_TABLET | Freq: Every day | ORAL | Status: DC

## 2011-08-30 MED ORDER — AMLODIPINE BESYLATE 5 MG PO TABS: 5.0000 mg | ORAL_TABLET | Freq: Every day | ORAL | Status: DC

## 2011-08-30 MED ORDER — METOPROLOL TARTRATE 25 MG PO TABS: 12.5000 mg | ORAL_TABLET | Freq: Two times a day (BID) | ORAL | Status: DC

## 2011-08-30 MED ORDER — PANTOPRAZOLE SODIUM 40 MG PO TBEC: 40.0000 mg | DELAYED_RELEASE_TABLET | Freq: Every day | ORAL | Status: DC

## 2011-08-30 MED ORDER — ATORVASTATIN CALCIUM 10 MG PO TABS: 10.0000 mg | ORAL_TABLET | Freq: Every day | ORAL | Status: DC

## 2011-08-30 MED ORDER — LANSOPRAZOLE 30 MG PO CPDR: 30.0000 mg | DELAYED_RELEASE_CAPSULE | Freq: Every day | ORAL | Status: DC

## 2011-08-30 MED ORDER — ALBUTEROL SULFATE HFA 108 (90 BASE) MCG/ACT IN AERS: 1.0000 | INHALATION_SPRAY | RESPIRATORY_TRACT | Status: DC | PRN

## 2011-08-30 MED ORDER — FOLIC ACID 1 MG PO TABS: 1.0000 mg | ORAL_TABLET | Freq: Every day | ORAL | Status: DC

## 2011-08-30 NOTE — Telephone Encounter (Signed)
I changed to generic prevacid, but if PA required for this, please ask pharmacist if any PPI does not require PA

## 2011-08-30 NOTE — Patient Instructions (Addendum)
Take all new medications as prescribed - the amlodipine 5 mg per day (for blood pressure), and the Aspirin 81 mg - 1 per day (COATED only to not irritate the stomach) Continue all other medications as before - the metoprolol, protonix and other medications on the list (all refilled to the pharmacy today) Please go to LAB in the Basement for the blood and/or urine tests to be done today Please call the phone number [phone] (the PhoneTree System) for results of testing in 2-3 days;  When calling, simply dial the number, and when prompted enter the MRN number above (the Medical Record Number) and the # key, then the message should start. Please return in 2 months, or sooner if needed, to check the blood pressure

## 2011-08-30 NOTE — Progress Notes (Signed)
Subjective:    Patient ID: [name], male    DOB: December 02, 1934, 77 y.o.   MRN: [id number]  HPI  Here for wellness and f/u;  Overall doing ok;  Pt denies CP, worsening SOB, DOE, wheezing, orthopnea, PND, worsening LE edema, palpitations, dizziness or syncope.  Pt denies neurological change such as new Headache, facial or extremity weakness.  Pt denies polydipsia, polyuria, or low sugar symptoms. Pt states overall good compliance with treatment and medications, good tolerability, and trying to follow lower cholesterol diet.  Pt denies worsening depressive symptoms, suicidal ideation or panic. No fever, wt loss, night sweats, loss of appetite, or other constitutional symptoms.  Pt states good ability with ADL's, low fall risk, home safety reviewed and adequate, no significant changes in hearing or vision, and occasionally active with exercise.  No new complaints. Not taking ASA .   Past Medical History  Diagnosis Date  . AAA (abdominal aortic aneurysm)   . Thrombocytopenia   . Abdominal aneurysm without mention of rupture 04/23/2011  . Peripheral arterial disease 04/23/2011  . COPD (chronic obstructive pulmonary disease) 05/06/2011  . Shortness of breath   . Anemia   . Iron deficiency anemia 05/26/2011   Past Surgical History  Procedure Date  . Abdominal aortic aneurysm repair   . Pr vein bypass graft,aorto-fem-pop   . Esophagogastroduodenoscopy 06/10/2011    Procedure: ESOPHAGOGASTRODUODENOSCOPY (EGD);  Surgeon: Petra Kuba, MD;  Location: [facility] ENDOSCOPY;  Service: Endoscopy;  Laterality: N/A;    reports that he quit smoking about 5 months ago. His smoking use included Cigarettes. He has a 12.5 pack-year smoking history. He has never used smokeless tobacco. He reports that he does not drink alcohol or use illicit drugs. family history includes COPD in his brother; Cancer in his mother; Diabetes in his sister; and Hypertension in his father. Allergies  Allergen Reactions  . Levaquin Other  (See Comments)    HEMOLYTIC ANEMIA   Current Outpatient Prescriptions on File Prior to Visit  Medication Sig Dispense Refill  . ferrous fumarate (HEMOCYTE - 106 MG FE) 325 (106 FE) MG TABS Take 1 tablet by mouth daily.      . Nutritional Supplements (ENSURE PO) Take by mouth daily.        Marland Kitchen dextromethorphan-guaiFENesin (MUCINEX DM) 30-600 MG per 12 hr tablet Take 1-2 tablets by mouth every 12 (twelve) hours.         Review of Systems Review of Systems  Constitutional: Negative for diaphoresis, activity change, appetite change and unexpected weight change.  HENT: Negative for hearing loss, ear pain, facial swelling, mouth sores and neck stiffness.   Eyes: Negative for pain, redness and visual disturbance.  Respiratory: Negative for shortness of breath and wheezing.   Cardiovascular: Negative for chest pain and palpitations.  Gastrointestinal: Negative for diarrhea, blood in stool, abdominal distention and rectal pain.  Genitourinary: Negative for hematuria, flank pain and decreased urine volume.  Musculoskeletal: Negative for myalgias and joint swelling.  Skin: Negative for color change and wound.  Neurological: Negative for syncope and numbness.  Hematological: Negative for adenopathy.  Psychiatric/Behavioral: Negative for hallucinations, self-injury, decreased concentration and agitation.      Objective:   Physical Exam BP 144/102  Pulse 94  Temp(Src) 97 F (36.1 C) (Oral)  Ht 5\' 11"  (1.803 m)  Wt 130 lb 8 oz (59.194 kg)  BMI 18.20 kg/m2  SpO2 94% Physical Exam  VS noted Constitutional: Pt is oriented to person, place, and time. Appears well-developed and well-nourished.  HENT:  Head: Normocephalic and atraumatic.  Right Ear: External ear normal.  Left Ear: External ear normal.  Nose: Nose normal.  Mouth/Throat: Oropharynx is clear and moist.  Eyes: Conjunctivae and EOM are normal. Pupils are equal, round, and reactive to light.  Neck: Normal range of motion. Neck supple.  No JVD present. No tracheal deviation present.  Cardiovascular: Normal rate, regular rhythm, normal heart sounds and intact distal pulses.   Pulmonary/Chest: Effort normal and breath sounds normal.  Abdominal: Soft. Bowel sounds are normal. There is no tenderness.  Musculoskeletal: Normal range of motion. Exhibits no edema.  Lymphadenopathy:  Has no cervical adenopathy.  Neurological: Pt is alert and oriented to person, place, and time. Pt has normal reflexes. No cranial nerve deficit.  Skin: Skin is warm and dry. No rash noted.  Psychiatric:  Has  normal mood and affect. Behavior is normal.     Assessment & Plan:

## 2011-08-30 NOTE — Assessment & Plan Note (Signed)
To start ASA 81 mg, as well as amlod 5, and cont lopressor as is,  to f/u any worsening symptoms or concerns. And OV in 2 mo

## 2011-08-30 NOTE — Telephone Encounter (Signed)
PA for Pantoprazole  SOD DR 40 mg requested or alternative, please adivse

## 2011-08-30 NOTE — Telephone Encounter (Signed)
Left message on phone tree  - labs ok except chol too high for his hx of circulation problem (LDL needs to be < 70), and he has a new problem of overactive thyroid by blood tests as well      1)   To start lipitor 10 qd     2)    To be referred to Dr Glenna Fellows for thyroid evaluation and tx  Zella Ball to inform pt, I will do rx and referral

## 2011-08-30 NOTE — Assessment & Plan Note (Signed)

## 2011-08-31 NOTE — Telephone Encounter (Signed)
Called informed the patient of lab results, new medication for cholesterol and referral. Also informed that the pantoprazole had been changed to generic prevacid.

## 2011-09-17 ENCOUNTER — Ambulatory Visit (HOSPITAL_BASED_OUTPATIENT_CLINIC_OR_DEPARTMENT_OTHER): Payer: BC Managed Care – PPO | Admitting: Oncology

## 2011-09-17 ENCOUNTER — Encounter: Payer: Self-pay | Admitting: Oncology

## 2011-09-17 ENCOUNTER — Telehealth: Payer: Self-pay | Admitting: Oncology

## 2011-09-17 ENCOUNTER — Other Ambulatory Visit (HOSPITAL_BASED_OUTPATIENT_CLINIC_OR_DEPARTMENT_OTHER): Payer: BC Managed Care – PPO | Admitting: Lab

## 2011-09-17 VITALS — BP 127/77 | HR 69 | Temp 96.8°F | Ht 71.0 in | Wt 131.5 lb

## 2011-09-17 DIAGNOSIS — D696 Thrombocytopenia, unspecified: Secondary | ICD-10-CM

## 2011-09-17 DIAGNOSIS — Z86711 Personal history of pulmonary embolism: Secondary | ICD-10-CM

## 2011-09-17 DIAGNOSIS — D509 Iron deficiency anemia, unspecified: Secondary | ICD-10-CM

## 2011-09-17 DIAGNOSIS — D649 Anemia, unspecified: Secondary | ICD-10-CM

## 2011-09-17 LAB — CBC WITH DIFFERENTIAL/PLATELET
Basophils Absolute: 0.1 10*3/uL (ref 0.0–0.1)
Eosinophils Absolute: 0.6 10*3/uL — ABNORMAL HIGH (ref 0.0–0.5)
HGB: 12.6 g/dL — ABNORMAL LOW (ref 13.0–17.1)
NEUT#: 7 10*3/uL — ABNORMAL HIGH (ref 1.5–6.5)
RDW: 14.8 % — ABNORMAL HIGH (ref 11.0–14.6)
lymph#: 3.5 10*3/uL — ABNORMAL HIGH (ref 0.9–3.3)

## 2011-09-17 LAB — COMPREHENSIVE METABOLIC PANEL
AST: 23 U/L (ref 0–37)
Albumin: 4.2 g/dL (ref 3.5–5.2)
BUN: 12 mg/dL (ref 6–23)
CO2: 26 mEq/L (ref 19–32)
Calcium: 9.2 mg/dL (ref 8.4–10.5)
Chloride: 103 mEq/L (ref 96–112)
Glucose, Bld: 111 mg/dL — ABNORMAL HIGH (ref 70–99)
Potassium: 3.9 mEq/L (ref 3.5–5.3)

## 2011-09-17 LAB — LACTATE DEHYDROGENASE: LDH: 178 U/L (ref 94–250)

## 2011-09-17 NOTE — Telephone Encounter (Signed)
gv pt appt schedule for July.  

## 2011-09-17 NOTE — Progress Notes (Signed)
OFFICE PROGRESS NOTE Date of Visit 09-17-2011 Physicians: B.Chen, J.John, S.Ellison, D.Grapey, [facility]  INTERVAL HISTORY:  Patient is seen, alone for visit, in follow up of his iron deficiency anemia, hemolytic anemia and mild thrombocytopenia. He continues oral iron and folate.He has been feeling very well since he was here last. He is now established with Dr.Darral John as primary, is to see Dr.Ellison next week for thyroid, and has been seen by Dr.Grapey.  Patient presented Sept 2012 with bleeding into RUE, with WBC 6.8, hgb 11 and plt 100k. Work up then included CT with normal spleen size and no adenopathy but with large AAA, no cirrhosis, iron deficiency, no + hemoccults, no vonWillebrand's, no HIT or myeloma, not obviously ITP. He had aortobifem bypass 03-31-2011, with pulmonary problems post operatively. He was admitted in early Dec with PE and new hemolytic anemia, with platelet count still slightly low. IVC filter was placed and he has not been on further anticoagulation. He had intermittent hematuria. He has not yet had endoscopy. Dr.Grapey did not find microhematuria, but his note recommends cystoscopy due to other risk factors. He had upper endoscopy by [facility], but has not had lower endoscopy.  [name] tells me that his energy and appetite are good, denies bleeding or unusual bruising, denies LE swelling or pain. He has very little cough, no increased shortness of breath or chest pain, is still not smoking. He is having no problems with the iron or the folate.  Review of Systems otherwise: no headache, no environmental allergy symptoms, no rash, no voiding symptoms.  Objective:  Vital signs in last 24 hours:  BP 127/77  Pulse 69  Temp(Src) 96.8 F (36 C) (Oral)  Ht 5\' 11"  (1.803 m)  Wt 131 lb 8 oz (59.648 kg)  BMI 18.34 kg/m2 Alert, quickly and easily ambulatory, looks the best that I have seen him. Weight is up 1.5 lbs from last month. Respirations not labored RA HEENT:mucous  membranes moist, pharynx normal without lesions. PERRL. No JVD.Dentures in. LymphaticsCervical, supraclavicular, and axillary nodes normal. Resp: clear to auscultation bilaterally, distant breath sounds. Cardio: regular rate and rhythm GI: soft, non-tender; bowel sounds normal; no masses,  no organomegaly Extremities: extremities normal, atraumatic, no cyanosis or edema. FIngers with minimal clubbing changes. Neuro:CN, motor, sensory grossly intact Skin without rash, petechiae or ecchymoses seen  Lab Results:   Basename 09/17/11 0926  WBC 12.1*  HGB 12.6*  HCT 37.7*  PLT 119*  ANC 7.0, RDW 14.8, differential not remarkable  BMET CMET, LDH, haptoglobin all pending. Studies/Results:  No results found.  Medications: I have reviewed the patient's current medications. He has brought his iron and folate. New medications by Dr.John noted.  Assessment/Plan:  1.Anemia: iron deficient and AIHA. Hgb stable, continuing iron and folate 2. Variable mild thrombocytopenia: no bleeding 3.aortobifemoral bypass done urgently fall 2012 4.spontaneous bleed into RUE as presentation in fall 2012, resolved 5.pulmonary embolus Dec 2012, IVC filter in and not on anticoagulation 6.long tobacco abuse, DC'd with above  I will see him with repeat counts ~ July or sooner if needed. He has apts set up with Dr.Ellison next week, Dr.John in May and Dr.Chen July.  Grant Benjamin P, MD   09/17/2011, 10:11 AM

## 2011-09-17 NOTE — Patient Instructions (Signed)
If cbc checked by other MDs and stable, we can move appointment with Dr.Gildardo Tickner out further.

## 2011-09-20 ENCOUNTER — Encounter: Payer: Self-pay | Admitting: Endocrinology

## 2011-09-20 ENCOUNTER — Ambulatory Visit (INDEPENDENT_AMBULATORY_CARE_PROVIDER_SITE_OTHER): Payer: Medicare Other | Admitting: Endocrinology

## 2011-09-20 VITALS — BP 128/82 | HR 85 | Temp 97.0°F | Ht 71.0 in | Wt 131.0 lb

## 2011-09-20 DIAGNOSIS — E0590 Thyrotoxicosis, unspecified without thyrotoxic crisis or storm: Secondary | ICD-10-CM

## 2011-09-20 NOTE — Progress Notes (Signed)
Subjective:    Patient ID: [name], male    DOB: August 14, 1934, 77 y.o.   MRN: [id number]  HPI Pt was noted on routine blood test to have suppressed tsh.  Pt states few mos of moderate (30 lbs) weight loss, throughout the body, in the context of surgery.  No assoc tremor.   Past Medical History  Diagnosis Date  . AAA (abdominal aortic aneurysm)   . Thrombocytopenia   . Abdominal aneurysm without mention of rupture 04/23/2011  . Peripheral arterial disease 04/23/2011  . COPD (chronic obstructive pulmonary disease) 05/06/2011  . Shortness of breath   . Anemia   . Iron deficiency anemia 05/26/2011  . Hyperlipidemia 08/30/2011    Past Surgical History  Procedure Date  . Abdominal aortic aneurysm repair   . Pr vein bypass graft,aorto-fem-pop   . Esophagogastroduodenoscopy 06/10/2011    Procedure: ESOPHAGOGASTRODUODENOSCOPY (EGD);  Surgeon: Petra Kuba, MD;  Location: [facility] ENDOSCOPY;  Service: Endoscopy;  Laterality: N/A;    History   Social History  . Marital Status: Married    Spouse Name: N/A    Number of Children: N/A  . Years of Education: 15   Occupational History  . 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 Smoking status: Former Smoker -- 0.2 packs/day for 50 years    Types: Cigarettes    Quit date: 03/30/2011  . Smokeless tobacco: Never Used  . Alcohol Use: No  . Drug Use: No  . Sexually Active: Not Currently   Other Topics Concern  . Not on file   Social History Narrative  . No narrative on file    Current Outpatient Prescriptions on File Prior to Visit  Medication Sig Dispense Refill  . albuterol (PROVENTIL HFA;VENTOLIN HFA) 108 (90 BASE) MCG/ACT inhaler Inhale 1-2 puffs into the lungs every 4 (four) hours as needed.  1 Inhaler  5  . amLODipine (NORVASC) 5 MG tablet Take 1 tablet (5 mg total) by mouth daily.  90 tablet  3  . aspirin 81 MG EC tablet Take 1 tablet (81 mg total) by mouth daily. Swallow whole.  30 tablet   12  . atorvastatin (LIPITOR) 10 MG tablet Take 1 tablet (10 mg total) by mouth daily.  90 tablet  3  . ferrous fumarate (HEMOCYTE - 106 MG FE) 325 (106 FE) MG TABS Take 1 tablet by mouth daily.      . folic acid (FOLVITE) 1 MG tablet Take 1 tablet (1 mg total) by mouth daily.  90 tablet  3  . lansoprazole (PREVACID) 30 MG capsule Take 1 capsule (30 mg total) by mouth daily.  90 capsule  3  . metoprolol tartrate (LOPRESSOR) 25 MG tablet Take 0.5 tablets (12.5 mg total) by mouth 2 (two) times daily.  90 tablet  3  . Nutritional Supplements (ENSURE PO) Take by mouth daily.          Allergies  Allergen Reactions  . Levaquin Other (See Comments)    HEMOLYTIC ANEMIA    Family History  Problem Relation Age of Onset  . Cancer Mother     breast  . Hypertension Father   . Diabetes Sister   . COPD Brother   no goiter or other thyroid problem  BP 128/82  Pulse 85  Temp(Src) 97 F (36.1 C) (Oral)  Ht 5\' 11"  (1.803 m)  Wt 131 lb (59.421 kg)  BMI 18.27 kg/m2  SpO2 98%  Review of Systems denies  fever, headache, hoarseness, double vision, palpitations, sob, diarrhea, polyuria, myalgias, excessive diaphoresis, numbness, seizure, anxiety, hypoglycemia, easy bruising, and rhinorrhea.      Objective:   Physical Exam VS: see vs page GEN: no distress HEAD: head: no deformity eyes: no periorbital swelling, no proptosis external nose and ears are normal mouth: no lesion seen NECK: supple, thyroid is not enlarged CHEST WALL: no deformity LUNGS: clear to auscultation BREASTS:  No gynecomastia CV: reg rate and rhythm, no murmur ABD: abdomen is soft, nontender.  no hepatosplenomegaly.  not distended.  no hernia MUSCULOSKELETAL: muscle bulk and strength are grossly normal.  no obvious joint swelling.  gait is normal and steady EXTEMITIES: no deformity.  no edema.   PULSES: dorsalis pedis intact bilat.   NEURO:  cn 2-12 grossly intact.   readily moves all 4's.  sensation is intact to touch on  all 4's.  No tremor.  SKIN:  Normal texture and temperature.  No rash or suspicious lesion is visible.  Not diaphoretic.     NODES:  None palpable at the neck PSYCH: alert, oriented x3.  Does not appear anxious nor depressed.  Lab Results  Component Value Date   TSH 0.15* 08/30/2011      Assessment & Plan:  Hyperthyroidism, new, uncertain etiology. Weight loss, prob due to the hyperthyroidism. H/o bradycardia.  This may recur with rx of the hyperthyroidism.

## 2011-09-20 NOTE — Patient Instructions (Addendum)
Please consider your options, and let me know what you would like to do.  If i was you, i would do the radioactive iodine treatment:   If you agree, we would check a thyroid "scan" (a special, but easy and painless type of thyroid x ray).  It works like this: you go to the x-ray department of the hospital to swallow a pill, which contains a miniscule amount of radiation.  You will not notice any symptoms from this.  You will go back to the x-ray department the next day, to lie down in front of a camera.  The results of this will be sent to me.   Based on the results, i hope to order for you a treatment pill of radioactive iodine.  Although it is a larger amount of radiation, you will again notice no symptoms from this.  The pill is gone from your body in a few days (during which you should stay away from other people), but takes several months to work.  Therefore, please return here approximately 6-8 weeks after the treatment.  This treatment has been available for many years, and the only known side-effect is an underactive thyroid.  It is possible that i would eventually prescribe for you a thyroid hormone pill, which is very inexpensive.  You don't have to worry about side-effects of this thyroid hormone pill, because it is the same molecule your thyroid makes.

## 2011-09-26 DIAGNOSIS — E0590 Thyrotoxicosis, unspecified without thyrotoxic crisis or storm: Secondary | ICD-10-CM | POA: Insufficient documentation

## 2011-09-27 ENCOUNTER — Other Ambulatory Visit: Payer: Self-pay

## 2011-09-27 MED ORDER — LANSOPRAZOLE 30 MG PO CPDR: 30.0000 mg | DELAYED_RELEASE_CAPSULE | Freq: Every day | ORAL | Status: DC

## 2011-11-01 ENCOUNTER — Encounter: Payer: Self-pay | Admitting: Internal Medicine

## 2011-11-01 ENCOUNTER — Ambulatory Visit (INDEPENDENT_AMBULATORY_CARE_PROVIDER_SITE_OTHER): Payer: BC Managed Care – PPO | Admitting: Internal Medicine

## 2011-11-01 ENCOUNTER — Other Ambulatory Visit: Payer: Self-pay | Admitting: Endocrinology

## 2011-11-01 VITALS — BP 120/70 | HR 70 | Temp 97.0°F | Ht 71.0 in | Wt 130.5 lb

## 2011-11-01 DIAGNOSIS — I10 Essential (primary) hypertension: Secondary | ICD-10-CM

## 2011-11-01 DIAGNOSIS — E0590 Thyrotoxicosis, unspecified without thyrotoxic crisis or storm: Secondary | ICD-10-CM

## 2011-11-01 DIAGNOSIS — E785 Hyperlipidemia, unspecified: Secondary | ICD-10-CM

## 2011-11-01 DIAGNOSIS — J449 Chronic obstructive pulmonary disease, unspecified: Secondary | ICD-10-CM

## 2011-11-01 NOTE — Assessment & Plan Note (Signed)
stable overall by hx and exam, most recent data reviewed with pt, and pt to continue medical treatment as before  SpO2 Readings from Last 3 Encounters:  11/01/11 96%  09/20/11 98%  08/30/11 94%

## 2011-11-01 NOTE — Assessment & Plan Note (Addendum)
D/w pt; he is ok with the tx, I will let Dr Everardo All know of his decision  Lab Results  Component Value Date   TSH 0.15* 08/30/2011

## 2011-11-01 NOTE — Assessment & Plan Note (Signed)
stable overall by hx and exam, most recent data reviewed with pt, and pt to continue medical treatment as before BP Readings from Last 3 Encounters:  11/01/11 120/70  09/20/11 128/82  09/17/11 127/77

## 2011-11-01 NOTE — Patient Instructions (Signed)
Continue all other medications as before Please keep your appointments with your specialists as you have planned I will let Dr Everardo All know of your wanting to have the thyroid treatment done Please return in 6 months, or sooner if needed

## 2011-11-01 NOTE — Assessment & Plan Note (Addendum)
stable overall by hx and exam, most recent data reviewed with pt, and pt to continue medical treatment as before, declines further change in tx - on new liptor 10 mg after mar 4, declines labs today, will try to do next visit Lab Results  Component Value Date   LDLCALC 133* 08/30/2011

## 2011-11-01 NOTE — Progress Notes (Signed)
Subjective:    Patient ID: [name], male    DOB: July 21, 1934, 76 y.o.   MRN: [id number]  HPI  Here to f/u; overall doing very well, has seen both endo with hyperthyroid, and heme with stable anemia;  Denies hyper or hypo thyroid symptoms such as voice, skin or hair change.  Pt does want to have the thyroid tx at this time.  Pt denies chest pain, increased sob or doe, wheezing, orthopnea, PND, increased LE swelling, palpitations, dizziness or syncope.  Pt denies new neurological symptoms such as new headache, or facial or extremity weakness or numbness   Pt denies polydipsia, polyuria.   Pt denies fever, wt loss, night sweats, loss of appetite, or other constitutional symptom  No new complaints.  Overall good compliance with treatment, and good medicine tolerability, and no leg edema on current meds Past Medical History  Diagnosis Date  . AAA (abdominal aortic aneurysm)   . Thrombocytopenia   . Abdominal aneurysm without mention of rupture 04/23/2011  . Peripheral arterial disease 04/23/2011  . COPD (chronic obstructive pulmonary disease) 05/06/2011  . Shortness of breath   . Anemia   . Iron deficiency anemia 05/26/2011  . Hyperlipidemia 08/30/2011   Past Surgical History  Procedure Date  . Abdominal aortic aneurysm repair   . Pr vein bypass graft,aorto-fem-pop   . Esophagogastroduodenoscopy 06/10/2011    Procedure: ESOPHAGOGASTRODUODENOSCOPY (EGD);  Surgeon: Petra Kuba, MD;  Location: [facility] ENDOSCOPY;  Service: Endoscopy;  Laterality: N/A;    reports that he quit smoking about 7 months ago. His smoking use included Cigarettes. He has a 12.5 pack-year smoking history. He has never used smokeless tobacco. He reports that he does not drink alcohol or use illicit drugs. family history includes COPD in his brother; Cancer in his mother; Diabetes in his sister; and Hypertension in his father. Allergies  Allergen Reactions  . Levofloxacin Other (See Comments)    HEMOLYTIC ANEMIA   Current  Outpatient Prescriptions on File Prior to Visit  Medication Sig Dispense Refill  . albuterol (PROVENTIL HFA;VENTOLIN HFA) 108 (90 BASE) MCG/ACT inhaler Inhale 1-2 puffs into the lungs every 4 (four) hours as needed.  1 Inhaler  5  . amLODipine (NORVASC) 5 MG tablet Take 1 tablet (5 mg total) by mouth daily.  90 tablet  3  . aspirin 81 MG EC tablet Take 1 tablet (81 mg total) by mouth daily. Swallow whole.  30 tablet  12  . atorvastatin (LIPITOR) 10 MG tablet Take 1 tablet (10 mg total) by mouth daily.  90 tablet  3  . ferrous fumarate (HEMOCYTE - 106 MG FE) 325 (106 FE) MG TABS Take 1 tablet by mouth daily.      . folic acid (FOLVITE) 1 MG tablet Take 1 tablet (1 mg total) by mouth daily.  90 tablet  3  . lansoprazole (PREVACID) 30 MG capsule Take 1 capsule (30 mg total) by mouth daily.  90 capsule  3  . metoprolol tartrate (LOPRESSOR) 25 MG tablet Take 0.5 tablets (12.5 mg total) by mouth 2 (two) times daily.  90 tablet  3  . Nutritional Supplements (ENSURE PO) Take by mouth daily.         Review of Systems Review of Systems  Constitutional: Negative for diaphoresis and unexpected weight change.  HENT: Negative for drooling and tinnitus.   Eyes: Negative for photophobia and visual disturbance.  Respiratory: Negative for choking and stridor.  Skin: Negative for color change and wound.  Neurological: Negative for  tremors and numbness.  Gastrointestinal: Negative for vomiting and blood in stool.  Genitourinary: Negative for hematuria and decreased urine volume.  Musculoskeletal: Negative for gait problem. .      Objective:   Physical ExamBP 120/70  Pulse 70  Temp(Src) 97 F (36.1 C) (Oral)  Ht 5\' 11"  (1.803 m)  Wt 130 lb 8 oz (59.194 kg)  BMI 18.20 kg/m2  SpO2 96% Physical Exam  VS noted, not ill appearing Constitutional: Pt appears well-developed and well-nourished.  HENT: Head: Normocephalic.  Right Ear: External ear normal.  Left Ear: External ear normal.  Eyes: Conjunctivae and  EOM are normal. Pupils are equal, round, and reactive to light.  Neck: Normal range of motion. Neck supple.  Cardiovascular: Normal rate and regular rhythm.   Pulmonary/Chest: Effort normal and breath sounds decreased without wheeze or rales Neurological: Pt is alert. No cranial nerve deficit.  Skin: Skin is warm. No erythema.  Psychiatric: Pt behavior is normal. Thought content normal.     Assessment & Plan:

## 2011-11-08 ENCOUNTER — Encounter (HOSPITAL_COMMUNITY)
Admission: RE | Admit: 2011-11-08 | Discharge: 2011-11-08 | Disposition: A | Discharge status: Home / Self Care | Payer: Medicare Other | Source: Ambulatory Visit | Attending: Endocrinology | Admitting: Endocrinology

## 2011-11-08 DIAGNOSIS — E0590 Thyrotoxicosis, unspecified without thyrotoxic crisis or storm: Secondary | ICD-10-CM | POA: Insufficient documentation

## 2011-11-09 ENCOUNTER — Encounter (HOSPITAL_COMMUNITY)
Admission: RE | Admit: 2011-11-09 | Discharge: 2011-11-09 | Disposition: A | Discharge status: Home / Self Care | Payer: Medicare Other | Source: Ambulatory Visit | Attending: Endocrinology | Admitting: Endocrinology

## 2011-11-09 ENCOUNTER — Encounter: Payer: Self-pay | Admitting: Endocrinology

## 2011-11-09 MED ORDER — SODIUM PERTECHNETATE TC 99M INJECTION
10.4000 | Freq: Once | INTRAVENOUS | Status: AC | PRN
  Administered 2011-11-09: 10 via INTRAVENOUS

## 2011-11-09 MED ORDER — SODIUM IODIDE I 131 CAPSULE
8.5000 | Freq: Once | INTRAVENOUS | Status: AC | PRN
  Administered 2011-11-08: 8.5 via ORAL

## 2011-11-10 ENCOUNTER — Telehealth: Payer: Self-pay | Admitting: *Deleted

## 2011-11-10 MED ORDER — METHIMAZOLE 5 MG PO TABS: 5.0000 mg | ORAL_TABLET | Freq: Every day | ORAL | Status: DC

## 2011-11-10 NOTE — Telephone Encounter (Signed)
Pt informed of lab results. Pt would like rx for daily medication.

## 2011-11-10 NOTE — Telephone Encounter (Signed)
i sent rx Please come back for a follow-up appointment for 1 month.  Please make an appointment. if ever you have fever while taking this medication, stop it and call us, because of the risk of a rare side-effect

## 2011-11-10 NOTE — Telephone Encounter (Signed)
Called pt to inform of thyroid scan results, left message for pt to callback office (letter also mailed to pt).

## 2011-11-11 NOTE — Telephone Encounter (Signed)
Pt informed of rx and to schedule F/U appointment in 1 month.

## 2011-11-16 ENCOUNTER — Telehealth: Payer: Self-pay

## 2011-11-16 NOTE — Telephone Encounter (Signed)
Received PA for Lansoprazole DR 30 mg qd proceed or offer alternative

## 2011-11-17 MED ORDER — PANTOPRAZOLE SODIUM 40 MG PO TBEC: 40.0000 mg | DELAYED_RELEASE_TABLET | Freq: Every day | ORAL | Status: DC

## 2011-11-17 NOTE — Telephone Encounter (Signed)
Called informed the patient of medication change. 

## 2011-11-17 NOTE — Telephone Encounter (Signed)
I sent generic protonix  - ok to try that if does not require PA

## 2011-11-17 NOTE — Telephone Encounter (Signed)
Called the patient left message to call back 

## 2011-12-24 ENCOUNTER — Telehealth: Payer: Self-pay

## 2011-12-24 MED ORDER — LANSOPRAZOLE 30 MG PO CPDR: 30.0000 mg | DELAYED_RELEASE_CAPSULE | Freq: Every day | ORAL | Status: DC

## 2011-12-24 NOTE — Telephone Encounter (Signed)
Ok for prevacid if ok with insurance

## 2011-12-24 NOTE — Telephone Encounter (Signed)
Received PA for Pantoprazole. Proceed or offer alternative

## 2012-01-10 ENCOUNTER — Encounter: Payer: Self-pay | Admitting: Oncology

## 2012-01-10 ENCOUNTER — Other Ambulatory Visit: Payer: BC Managed Care – PPO | Admitting: Lab

## 2012-01-10 ENCOUNTER — Ambulatory Visit: Payer: BC Managed Care – PPO | Admitting: Oncology

## 2012-01-10 NOTE — Patient Instructions (Signed)
[facility] visit + lab

## 2012-01-10 NOTE — Progress Notes (Signed)
FTKA lab and MD today. Order to schedulers to reschedule visit+ CBC  L.Darrold Span MD

## 2012-01-11 ENCOUNTER — Telehealth: Payer: Self-pay | Admitting: Oncology

## 2012-01-11 NOTE — Telephone Encounter (Signed)
Appt r/s fr 7/15 to 7/31 per pt rqst

## 2012-01-17 ENCOUNTER — Other Ambulatory Visit: Payer: Self-pay | Admitting: Oncology

## 2012-01-21 ENCOUNTER — Ambulatory Visit: Payer: BC Managed Care – PPO | Admitting: Neurosurgery

## 2012-01-21 ENCOUNTER — Other Ambulatory Visit: Payer: BC Managed Care – PPO

## 2012-01-26 ENCOUNTER — Other Ambulatory Visit (HOSPITAL_BASED_OUTPATIENT_CLINIC_OR_DEPARTMENT_OTHER): Payer: Medicare Other | Admitting: Lab

## 2012-01-26 ENCOUNTER — Ambulatory Visit (HOSPITAL_BASED_OUTPATIENT_CLINIC_OR_DEPARTMENT_OTHER): Payer: Medicare Other | Admitting: Lab

## 2012-01-26 ENCOUNTER — Encounter: Payer: Self-pay | Admitting: Oncology

## 2012-01-26 ENCOUNTER — Telehealth: Payer: Self-pay | Admitting: Oncology

## 2012-01-26 ENCOUNTER — Other Ambulatory Visit: Payer: Self-pay

## 2012-01-26 ENCOUNTER — Ambulatory Visit (HOSPITAL_COMMUNITY): Payer: Medicare Other

## 2012-01-26 ENCOUNTER — Ambulatory Visit (HOSPITAL_BASED_OUTPATIENT_CLINIC_OR_DEPARTMENT_OTHER): Payer: Medicare Other | Admitting: Oncology

## 2012-01-26 VITALS — BP 150/89 | HR 56 | Temp 96.9°F | Ht 71.0 in | Wt 128.4 lb

## 2012-01-26 DIAGNOSIS — D509 Iron deficiency anemia, unspecified: Secondary | ICD-10-CM

## 2012-01-26 DIAGNOSIS — N289 Disorder of kidney and ureter, unspecified: Secondary | ICD-10-CM

## 2012-01-26 DIAGNOSIS — D649 Anemia, unspecified: Secondary | ICD-10-CM

## 2012-01-26 DIAGNOSIS — D599 Acquired hemolytic anemia, unspecified: Secondary | ICD-10-CM

## 2012-01-26 DIAGNOSIS — I2699 Other pulmonary embolism without acute cor pulmonale: Secondary | ICD-10-CM

## 2012-01-26 DIAGNOSIS — J449 Chronic obstructive pulmonary disease, unspecified: Secondary | ICD-10-CM

## 2012-01-26 DIAGNOSIS — D696 Thrombocytopenia, unspecified: Secondary | ICD-10-CM

## 2012-01-26 LAB — CBC WITH DIFFERENTIAL/PLATELET
BASO%: 0.3 % (ref 0.0–2.0)
HCT: 35.4 % — ABNORMAL LOW (ref 38.4–49.9)
LYMPH%: 37.1 % (ref 14.0–49.0)
MCHC: 33.4 g/dL (ref 32.0–36.0)
MCV: 85 fL (ref 79.3–98.0)
MONO#: 0.5 10*3/uL (ref 0.1–0.9)
MONO%: 7.7 % (ref 0.0–14.0)
NEUT%: 49.6 % (ref 39.0–75.0)
Platelets: 88 10*3/uL — ABNORMAL LOW (ref 140–400)
RBC: 4.16 10*6/uL — ABNORMAL LOW (ref 4.20–5.82)
WBC: 6 10*3/uL (ref 4.0–10.3)

## 2012-01-26 MED ORDER — ALBUTEROL SULFATE HFA 108 (90 BASE) MCG/ACT IN AERS: 1.0000 | INHALATION_SPRAY | Freq: Four times a day (QID) | RESPIRATORY_TRACT | Status: DC

## 2012-01-26 MED ORDER — FERROUS FUMARATE 325 (106 FE) MG PO TABS: 1.0000 | ORAL_TABLET | Freq: Every day | ORAL | Status: DC

## 2012-01-26 NOTE — Progress Notes (Signed)
OFFICE PROGRESS NOTE   01/26/2012   Physicians:B.Chen, J.John, S.Ellison, D.Grapey, W.Outlaw   INTERVAL HISTORY:  Patient is seen, alone for visit, this rescheduled after a recent missed appointment at this office, in follow up of anemia and mild thrombocytopenia,  Patient presented Sept 2012 with bleeding into RUE, with WBC 6.8, hgb 11 and plt 100k. Work up then included CT with normal spleen size and no adenopathy but with large AAA, no cirrhosis, iron deficiency, no + hemoccults, no vonWillebrand's, no HIT or myeloma, not obviously ITP. He had aortobifem bypass 03-31-2011, with pulmonary problems post operatively. He was admitted in early Dec with PE and new hemolytic anemia, with platelet count still slightly low. IVC filter was placed and he has not been on further anticoagulation. He had intermittent hematuria. He has not yet had endoscopy. Dr.Grapey did not find microhematuria, but recommended cystoscopy due to other risk factors; I am not certain that this has been done.  He had upper endoscopy by [facility] in hospital but has not had colonoscopy.  Patient complains of increased cough x at least a month, with white sputum and not associated increased shortness of breath, fever or chest pain. He also complains of decreased hearing in left ear. He has not taken oral iron in quite some time. He has otherwise been feeling well, with good appetite and enjoying lots of fishing. He denies pain in LE, any bleeding, new or different pain otherwise. He denies GERD symptoms or change in bowels. He has had no LE swelling. Remainder of 10 point Review of Systems negative.  Objective:  Vital signs in last 24 hours:  BP 150/89  Pulse 56  Temp 96.9 F (36.1 C) (Oral)  Ht 5\' 11"  (1.803 m)  Wt 128 lb 6.4 oz (58.242 kg)  BMI 17.91 kg/m2 Weight is up 6 lbs from March. Ambulatory without difficulty, occasional cough but respirations not labored RA. Alert, very pleasant as always, looks  comfortable.  HEENT:PERRLA, sclera clear, anicteric and oropharynx clear, no lesions LymphaticsCervical, supraclavicular, and axillary nodes normal. Resp: no dullness to percussion. Scattered coarse crackles in lower fields bilaterally clear with cough. Somewhat diminished breath sounds thruout. No use of accessory muscles Cardio: regular rate and rhythm GI: soft, non-tender; bowel sounds normal; no masses,  no organomegaly Extremities: extremities normal, atraumatic, no cyanosis or edema. No swelling RUE where previous bleed Neuro: nonfocal Skin without rash, petechiae, ecchymoses  Lab Results:  Results for orders placed in visit on 01/26/12  CBC WITH DIFFERENTIAL      Component Value Range   WBC 6.0  4.0 - 10.3 10e3/uL   NEUT# 3.0  1.5 - 6.5 10e3/uL   HGB 11.8 (*) 13.0 - 17.1 g/dL   HCT 16.1 (*) 09.6 - 04.5 %   Platelets 88 (*) 140 - 400 10e3/uL   MCV 85.0  79.3 - 98.0 fL   MCH 28.4  27.2 - 33.4 pg   MCHC 33.4  32.0 - 36.0 g/dL   RBC 4.09 (*) 8.11 - 9.14 10e6/uL   RDW 14.3  11.0 - 14.6 %   lymph# 2.2  0.9 - 3.3 10e3/uL   MONO# 0.5  0.1 - 0.9 10e3/uL   Eosinophils Absolute 0.3  0.0 - 0.5 10e3/uL   Basophils Absolute 0.0  0.0 - 0.1 10e3/uL   NEUT% 49.6  39.0 - 75.0 %   LYMPH% 37.1  14.0 - 49.0 %   MONO% 7.7  0.0 - 14.0 %   EOS% 5.3  0.0 - 7.0 %  BASO% 0.3  0.0 - 2.0 %  [facility] SMEAR      Component Value Range   Smear Result Smear Available      CMET available after visit has creatinine 1.4 with BUN 15, otherwise normal including Tbili 0.5. (Creatinine had been 1.2 - 1.3 in last 8 months.)  LDH normal at 2.9 Iron 71, %sat 33 (down from 119 and 62% in Jan) Haptoglobin 167 DAT positive IgG Studies/Results:  No results found.  Medications: I have reviewed the patient's current medications. He will resume ferrous fumarate for now. He had albuterol inhaler after hospitalization last year and will resume that.  With further drop in platelets and some decrease in hemoglobin, I  have recommended bone marrow exam, as he may have some underlying MDS. I have also asked GI to see back to consider colonoscopy.  Assessment/Plan: 1.Anemia: iron deficient in 2012, source not identified then. Has not had colonoscopy.Hemolytic anemia in Dec 2012 possibly medication related, DAT + but haptoglobin/bili/LDH normal. Chronic disease and chronic renal insufficiency. 2.thrombocytopenia: platelets lower now without overt bleeding. Normal spleen 2012. No HIT or ITP previously. Will set up bone marrow exam. 3.pulmonary embolus Dec 2012 likely related to immobility post op aortobifem, IVC filter placed, not on anticoagulation 4. AAA and peripheral vascular disease, post aortobifem Oct 2012. For follow up with vascular surgery 5.past hematuria, known to urology. ? Related to hemolysis 6.COPD, long past tobacco. Does not appear to have bronchitis, will resume inhaler. 7. Apparent spontaneous bleed into RUE fall 2012  I will see him back after bone marrow exam.      [name],LENNIS P, MD   01/26/2012, 11:43 AM

## 2012-01-26 NOTE — Telephone Encounter (Signed)
Add to previous note. LL aware pt seeing Dr. Dulce Sellar 8/23 and per Office [facility]) colonoscopy will be scheduled w/pt at visit if needed. Since pt only saw Dr. Randa Evens in the hospital appt could be schedule w/anyoneper Corrie Dandy. Dr. Dulce Sellar had the next available appt.

## 2012-01-26 NOTE — Patient Instructions (Signed)
Drops of baby oil into left ear daily for ~ a week, to loosen impacted wax.  Then can use dental water pick to wash it out, or ask primary care or urgent care to wash out ear.

## 2012-01-26 NOTE — Telephone Encounter (Signed)
Pt sent back to lb and given appt schedule for August and appt for 8/23 w/Dr. Dulce Sellar @ [facility]. Pt aware that he will be contacted re appt for bx. S/w Lyla Son in central she is aware of order and central will contact pt w/bx order.

## 2012-01-27 LAB — COMPREHENSIVE METABOLIC PANEL
ALT: <8 U/L (ref 0–53)
Albumin: 4.3 g/dL (ref 3.5–5.2)
CO2: 25 mEq/L (ref 19–32)
Calcium: 9.3 mg/dL (ref 8.4–10.5)
Chloride: 108 mEq/L (ref 96–112)
Glucose, Bld: 82 mg/dL (ref 70–99)
Sodium: 141 mEq/L (ref 135–145)
Total Bilirubin: 0.5 mg/dL (ref 0.3–1.2)
Total Protein: 7.5 g/dL (ref 6.0–8.3)

## 2012-01-27 LAB — IRON AND TIBC
%SAT: 33 % (ref 20–55)
Iron: 71 ug/dL (ref 42–165)

## 2012-01-27 LAB — LACTATE DEHYDROGENASE: LDH: 209 U/L (ref 94–250)

## 2012-01-27 LAB — DIRECT ANTIGLOBULIN TEST (NOT AT ARMC)
DAT (Complement): NEGATIVE
DAT IgG: POSITIVE — AB

## 2012-01-28 ENCOUNTER — Encounter (HOSPITAL_COMMUNITY): Payer: Self-pay | Admitting: Pharmacy Technician

## 2012-01-28 MED ORDER — MIDAZOLAM HCL 2 MG/2ML IJ SOLN
INTRAMUSCULAR | Status: AC
  Filled 2012-01-28: qty 2

## 2012-01-28 MED ORDER — FENTANYL CITRATE 0.05 MG/ML IJ SOLN
INTRAMUSCULAR | Status: AC
  Filled 2012-01-28: qty 4

## 2012-01-28 MED ORDER — HYDROMORPHONE HCL PF 2 MG/ML IJ SOLN
INTRAMUSCULAR | Status: AC
  Filled 2012-01-28: qty 1

## 2012-02-01 ENCOUNTER — Telehealth: Payer: Self-pay

## 2012-02-01 ENCOUNTER — Other Ambulatory Visit: Payer: Self-pay | Admitting: Radiology

## 2012-02-01 NOTE — Telephone Encounter (Signed)
Spoke with [name] in [place] and he will enter order for specimen from [facility] to be sent for cytogenetics.  Pt. For [facility] tomorrow 02-02-12 in CT.

## 2012-02-02 ENCOUNTER — Encounter (HOSPITAL_COMMUNITY): Payer: Self-pay

## 2012-02-02 ENCOUNTER — Ambulatory Visit (HOSPITAL_COMMUNITY)
Admission: RE | Admit: 2012-02-02 | Discharge: 2012-02-02 | Disposition: A | Discharge status: Home / Self Care | Payer: Medicare Other | Source: Ambulatory Visit | Attending: Oncology | Admitting: Oncology

## 2012-02-02 DIAGNOSIS — D649 Anemia, unspecified: Secondary | ICD-10-CM

## 2012-02-02 DIAGNOSIS — D696 Thrombocytopenia, unspecified: Secondary | ICD-10-CM | POA: Insufficient documentation

## 2012-02-02 DIAGNOSIS — E785 Hyperlipidemia, unspecified: Secondary | ICD-10-CM | POA: Insufficient documentation

## 2012-02-02 DIAGNOSIS — Z79899 Other long term (current) drug therapy: Secondary | ICD-10-CM | POA: Insufficient documentation

## 2012-02-02 DIAGNOSIS — J4489 Other specified chronic obstructive pulmonary disease: Secondary | ICD-10-CM | POA: Insufficient documentation

## 2012-02-02 DIAGNOSIS — I739 Peripheral vascular disease, unspecified: Secondary | ICD-10-CM | POA: Insufficient documentation

## 2012-02-02 DIAGNOSIS — J449 Chronic obstructive pulmonary disease, unspecified: Secondary | ICD-10-CM | POA: Insufficient documentation

## 2012-02-02 DIAGNOSIS — Z87891 Personal history of nicotine dependence: Secondary | ICD-10-CM | POA: Insufficient documentation

## 2012-02-02 DIAGNOSIS — Z7982 Long term (current) use of aspirin: Secondary | ICD-10-CM | POA: Insufficient documentation

## 2012-02-02 HISTORY — DX: Liver disease, unspecified: K76.9

## 2012-02-02 LAB — APTT: aPTT: 37 seconds (ref 24–37)

## 2012-02-02 LAB — CBC
HCT: 34.2 % — ABNORMAL LOW (ref 39.0–52.0)
Hemoglobin: 11.7 g/dL — ABNORMAL LOW (ref 13.0–17.0)
MCV: 81.4 fL (ref 78.0–100.0)
RBC: 4.2 MIL/uL — ABNORMAL LOW (ref 4.22–5.81)
RDW: 14.1 % (ref 11.5–15.5)
WBC: 6.5 10*3/uL (ref 4.0–10.5)

## 2012-02-02 LAB — PROTIME-INR: INR: 1.15 (ref 0.00–1.49)

## 2012-02-02 MED ORDER — FENTANYL CITRATE 0.05 MG/ML IJ SOLN
INTRAMUSCULAR | Status: AC
  Filled 2012-02-02: qty 8

## 2012-02-02 MED ORDER — MIDAZOLAM HCL 2 MG/2ML IJ SOLN
INTRAMUSCULAR | Status: AC
  Filled 2012-02-02: qty 8

## 2012-02-02 MED ORDER — SODIUM CHLORIDE 0.9 % IV SOLN
INTRAVENOUS | Status: DC
  Administered 2012-02-02: 07:00:00 via INTRAVENOUS

## 2012-02-02 MED ORDER — FENTANYL CITRATE 0.05 MG/ML IJ SOLN
INTRAMUSCULAR | Status: AC | PRN
  Administered 2012-02-02: 100 ug via INTRAVENOUS

## 2012-02-02 MED ORDER — MIDAZOLAM HCL 5 MG/5ML IJ SOLN
INTRAMUSCULAR | Status: AC | PRN
  Administered 2012-02-02 (×2): 2 mg via INTRAVENOUS

## 2012-02-02 NOTE — Procedures (Signed)
Technically successful CT guided bone marrow aspiration and biopsy of right iliac crest. No immediate complications. Awaiting pathology report.   

## 2012-02-02 NOTE — H&P (Signed)
[name] is an 77 y.o. male.   Chief Complaint: "I'm here for a bone marrow biopsy" HPI: Patient with history of anemia and thrombocytopenia presents today for CT guided bone marrow biopsy.  Past Medical History  Diagnosis Date  . AAA (abdominal aortic aneurysm)   . Thrombocytopenia   . Abdominal aneurysm without mention of rupture 04/23/2011  . Peripheral arterial disease 04/23/2011  . COPD (chronic obstructive pulmonary disease) 05/06/2011  . Shortness of breath   . Anemia   . Iron deficiency anemia 05/26/2011  . Hyperlipidemia 08/30/2011    Past Surgical History  Procedure Date  . Abdominal aortic aneurysm repair   . Pr vein bypass graft,aorto-fem-pop   . Esophagogastroduodenoscopy 06/10/2011    Procedure: ESOPHAGOGASTRODUODENOSCOPY (EGD);  Surgeon: Petra Kuba, MD;  Location: [facility] ENDOSCOPY;  Service: Endoscopy;  Laterality: N/A;    Family History  Problem Relation Age of Onset  . Cancer Mother     breast  . Hypertension Father   . Diabetes Sister   . COPD Brother    Social History:  reports that he quit smoking about 10 months ago. His smoking use included Cigarettes. He has a 12.5 pack-year smoking history. He has never used smokeless tobacco. He reports that he does not drink alcohol or use illicit drugs.  Allergies:  Allergies  Allergen Reactions  . Levofloxacin Other (See Comments)    HEMOLYTIC ANEMIA    Current outpatient prescriptions:albuterol (PROVENTIL HFA;VENTOLIN HFA) 108 (90 BASE) MCG/ACT inhaler, Inhale 1-2 puffs into the lungs every 4 (four) hours as needed. Wheezing, Disp: , Rfl: ;  aspirin 81 MG EC tablet, Take 81 mg by mouth daily before breakfast. Swallow whole., Disp: , Rfl: ;  ferrous fumarate (HEMOCYTE - 106 MG FE) 325 (106 FE) MG TABS, Take 1 tablet (106 mg of iron total) by mouth daily., Disp: 30 each, Rfl: 3 folic acid (FOLVITE) 1 MG tablet, Take 1 mg by mouth daily., Disp: , Rfl: ;  pantoprazole (PROTONIX) 40 MG tablet, Take 40 mg by mouth  daily., Disp: , Rfl: ;  amLODipine (NORVASC) 5 MG tablet, Take 5 mg by mouth daily., Disp: , Rfl: ;  atorvastatin (LIPITOR) 10 MG tablet, Take 10 mg by mouth daily before breakfast., Disp: , Rfl: ;  methimazole (TAPAZOLE) 5 MG tablet, Take 1 tablet (5 mg total) by mouth daily., Disp: 30 tablet, Rfl: 1 metoprolol tartrate (LOPRESSOR) 25 MG tablet, Take 12.5 mg by mouth 2 (two) times daily., Disp: , Rfl: ;  pantoprazole (PROTONIX) 40 MG tablet, Take 40 mg by mouth daily., Disp: , Rfl:  Current facility-administered medications:0.9 %  sodium chloride infusion, , Intravenous, Continuous, Brayton El, PA, Last Rate: 20 mL/hr at 02/02/12 0720   Results for orders placed during the hospital encounter of 02/02/12 (from the past 48 hour(s))  APTT     Status: Normal   Collection Time   02/02/12  7:19 AM      Component Value Range Comment   aPTT 37  24 - 37 seconds   CBC     Status: Abnormal   Collection Time   02/02/12  7:19 AM      Component Value Range Comment   WBC 6.5  4.0 - 10.5 K/uL    RBC 4.20 (*) 4.22 - 5.81 MIL/uL    Hemoglobin 11.7 (*) 13.0 - 17.0 g/dL    HCT 16.1 (*) 09.6 - 52.0 %    MCV 81.4  78.0 - 100.0 fL    MCH 27.9  26.0 - 34.0 pg    MCHC 34.2  30.0 - 36.0 g/dL    RDW 25.3  66.4 - 40.3 %    Platelets 83 (*) 150 - 400 K/uL   PROTIME-INR     Status: Normal   Collection Time   02/02/12  7:19 AM      Component Value Range Comment   Prothrombin Time 14.9  11.6 - 15.2 seconds    INR 1.15  0.00 - 1.49    No results found.  Review of Systems  Constitutional: Negative for fever and chills.  HENT:       Difficulty hearing left ear  Respiratory: Positive for cough. Negative for shortness of breath.   Cardiovascular: Negative for chest pain.  Gastrointestinal: Negative for nausea, vomiting and abdominal pain.  Musculoskeletal: Negative for back pain.  Neurological: Negative for headaches.  Endo/Heme/Allergies: Does not bruise/bleed easily.    Blood pressure 145/85, pulse 56,  temperature 97.7 F (36.5 C), temperature source Oral, resp. rate 18, weight 128 lb (58.06 kg), SpO2 99.00%. Physical Exam  Constitutional: He is oriented to person, place, and time.       Thin BM in NAD  Cardiovascular:       bradycardic with occ ectopy  Respiratory: Effort normal. He has no wheezes.       Distant BS throughout  GI: Soft. Bowel sounds are normal. There is no tenderness.  Musculoskeletal: Normal range of motion. He exhibits no edema.  Neurological: He is alert and oriented to person, place, and time.     Assessment/Plan Patient with history of anemia and thrombocytopenia; plan is for CT guided bone marrow biopsy today. Details/risks of procedure d/w pt with his understanding and consent.  ALLRED,D KEVIN 02/02/2012, 8:29 AM

## 2012-02-03 ENCOUNTER — Ambulatory Visit: Payer: BC Managed Care – PPO | Admitting: Neurosurgery

## 2012-02-03 ENCOUNTER — Other Ambulatory Visit: Payer: BC Managed Care – PPO

## 2012-02-07 ENCOUNTER — Telehealth: Payer: Self-pay | Admitting: Oncology

## 2012-02-07 NOTE — Telephone Encounter (Signed)
Pt called and r/s appt from 8/13 to 03/03/12, MD visit only, RN aware of rescheduled appt

## 2012-02-08 ENCOUNTER — Ambulatory Visit: Payer: Medicare Other | Admitting: Oncology

## 2012-02-15 ENCOUNTER — Emergency Department (HOSPITAL_COMMUNITY)
Admission: EM | Admit: 2012-02-15 | Discharge: 2012-02-15 | Disposition: A | Discharge status: Home / Self Care | Payer: Medicare Other | Attending: Emergency Medicine | Admitting: Emergency Medicine

## 2012-02-15 ENCOUNTER — Emergency Department (HOSPITAL_COMMUNITY): Payer: Medicare Other

## 2012-02-15 ENCOUNTER — Telehealth: Payer: Self-pay | Admitting: Internal Medicine

## 2012-02-15 ENCOUNTER — Encounter (HOSPITAL_COMMUNITY): Payer: Self-pay | Admitting: *Deleted

## 2012-02-15 DIAGNOSIS — F17200 Nicotine dependence, unspecified, uncomplicated: Secondary | ICD-10-CM | POA: Insufficient documentation

## 2012-02-15 DIAGNOSIS — D509 Iron deficiency anemia, unspecified: Secondary | ICD-10-CM | POA: Insufficient documentation

## 2012-02-15 DIAGNOSIS — E785 Hyperlipidemia, unspecified: Secondary | ICD-10-CM | POA: Insufficient documentation

## 2012-02-15 DIAGNOSIS — M79609 Pain in unspecified limb: Secondary | ICD-10-CM | POA: Insufficient documentation

## 2012-02-15 DIAGNOSIS — J4489 Other specified chronic obstructive pulmonary disease: Secondary | ICD-10-CM | POA: Insufficient documentation

## 2012-02-15 DIAGNOSIS — J449 Chronic obstructive pulmonary disease, unspecified: Secondary | ICD-10-CM | POA: Insufficient documentation

## 2012-02-15 DIAGNOSIS — M79606 Pain in leg, unspecified: Secondary | ICD-10-CM

## 2012-02-15 MED ORDER — HYDROCODONE-ACETAMINOPHEN 5-325 MG PO TABS: 1.0000 | ORAL_TABLET | ORAL | Status: AC | PRN

## 2012-02-15 NOTE — Telephone Encounter (Signed)
Caller: [name]/Patient; Patient Name: [name]; PCP: Oliver Barre; Best Callback Phone Number: [phone]; Reason for call: Other.  Spouse states that pt is at ED right now to have leg checked out.  Spouse states left leg is swollen up in his thigh;  not sure if it is an injury. Spouse also states pt may not be taken medication that is prescribed.  Pt is currently being seen at [facility] ED.  No triage, put registered in ED.

## 2012-02-15 NOTE — ED Provider Notes (Signed)
History     CSN: [id number]  Arrival date & time 02/15/12  1733   First MD Initiated Contact with Patient 02/15/12 1818      Chief Complaint  Patient presents with  . Leg Pain  . Leg Swelling    (Consider location/radiation/quality/duration/timing/severity/associated sxs/prior treatment) HPI Comments: [name] is a 77 y.o. Male who began having left leg pain. Insidiously yesterday. He looked at it and noticed it was swelling. The discomfort persisted. The discomfort is in the left thigh. He is walking with a limp because the discomfort. No known trauma. No recent illnesses. He denies fever, chills, nausea, vomiting, weakness, or dizziness. He has intact sensation in his left leg. There is no left foot pain. There are no palliative factors. The pain is worse with ambulation.  Patient is a 77 y.o. male presenting with leg pain. The history is provided by the patient.  Leg Pain     Past Medical History  Diagnosis Date  . AAA (abdominal aortic aneurysm)   . Thrombocytopenia   . Abdominal aneurysm without mention of rupture 04/23/2011  . Peripheral arterial disease 04/23/2011  . COPD (chronic obstructive pulmonary disease) 05/06/2011  . Shortness of breath   . Anemia   . Iron deficiency anemia 05/26/2011  . Hyperlipidemia 08/30/2011  . Liver damage 1960's    Several liver bx due to elevated liver functions    Past Surgical History  Procedure Date  . Abdominal aortic aneurysm repair   . Pr vein bypass graft,aorto-fem-pop   . Esophagogastroduodenoscopy 06/10/2011    Procedure: ESOPHAGOGASTRODUODENOSCOPY (EGD);  Surgeon: Petra Kuba, MD;  Location: [facility] ENDOSCOPY;  Service: Endoscopy;  Laterality: N/A;    Family History  Problem Relation Age of Onset  . Cancer Mother     breast  . Hypertension Father   . Diabetes Sister   . COPD Brother     History  Substance Use Topics  . Smoking status: Current Everyday Smoker -- 0.2 packs/day for 50 years    Types: Cigarettes   Last Attempt to Quit: 03/30/2011  . Smokeless tobacco: Never Used  . Alcohol Use: No     Quit 20 years ago. Was a heavy drinker in his 20s-30s      Review of Systems  All other systems reviewed and are negative.    Allergies  Levofloxacin  Home Medications   Current Outpatient Rx  Name Route Sig Dispense Refill  . ALBUTEROL SULFATE HFA 108 (90 BASE) MCG/ACT IN AERS Inhalation Inhale 1-2 puffs into the lungs every 4 (four) hours as needed. Wheezing    . AMLODIPINE BESYLATE 5 MG PO TABS Oral Take 5 mg by mouth daily.    . ASPIRIN 81 MG PO TBEC Oral Take 81 mg by mouth daily before breakfast. Swallow whole.    . ATORVASTATIN CALCIUM 10 MG PO TABS Oral Take 10 mg by mouth daily before breakfast.    . FERROUS FUMARATE 325 (106 FE) MG PO TABS Oral Take 1 tablet (106 mg of iron total) by mouth daily. 30 each 3    Take daily on empty stomach with OJ  . FOLIC ACID 1 MG PO TABS Oral Take 1 mg by mouth daily.    Marland Kitchen METHIMAZOLE 5 MG PO TABS Oral Take 1 tablet (5 mg total) by mouth daily. 30 tablet 1  . METOPROLOL TARTRATE 25 MG PO TABS Oral Take 12.5 mg by mouth 2 (two) times daily.    Marland Kitchen PANTOPRAZOLE SODIUM 40 MG PO TBEC Oral  Take 40 mg by mouth daily.    Marland Kitchen HYDROCODONE-ACETAMINOPHEN 5-325 MG PO TABS Oral Take 1 tablet by mouth every 4 (four) hours as needed for pain. 10 tablet 0    BP 125/87  Pulse 82  Temp 99.3 F (37.4 C) (Oral)  Resp 14  Ht 5\' 11"  (1.803 m)  Wt 128 lb (58.06 kg)  BMI 17.85 kg/m2  SpO2 100%  Physical Exam  Nursing note and vitals reviewed. Constitutional: He is oriented to person, place, and time. He appears well-developed and well-nourished.  HENT:  Head: Normocephalic and atraumatic.  Right Ear: External ear normal.  Left Ear: External ear normal.  Eyes: Conjunctivae and EOM are normal. Pupils are equal, round, and reactive to light.  Neck: Normal range of motion and phonation normal. Neck supple.  Cardiovascular: Normal rate, regular rhythm, normal heart  sounds and intact distal pulses.        No palpable left dorsalis pedis pulse. Bounding left inguinal pulses. Vaguely discolored left leg, somewhat lighter in tone in the right leg.   Pulmonary/Chest: Effort normal and breath sounds normal. He exhibits no bony tenderness.  Abdominal: Soft. Normal appearance. There is no tenderness.  Musculoskeletal: Normal range of motion.       Diffuse left lateral thigh, swelling, and tenderness. He resists active hip flexion secondary to pain, in the thigh. No pain left hip, with passive range of motion.  Neurological: He is alert and oriented to person, place, and time. He has normal strength. No cranial nerve deficit or sensory deficit. He exhibits normal muscle tone. Coordination normal.  Skin: Skin is warm, dry and intact.  Psychiatric: He has a normal mood and affect. His behavior is normal. Judgment and thought content normal.    ED Course  Procedures (including critical care time)  Labs Reviewed - No data to display Dg Femur Left  02/15/2012  *RADIOLOGY REPORT*  Clinical Data: Swelling, pain.  LEFT FEMUR - 2 VIEW  Comparison: None  Findings: No acute bony abnormality.  Specifically, no fracture, subluxation, or dislocation.  Soft tissues are intact.  Vascular calcifications noted.  IMPRESSION: No acute bony abnormality.   Original Report Authenticated By: Cyndie Chime, M.D.      1. Leg pain       MDM  Nonspecific leg pain. Doubt DVT, arterial insufficiency or cellulitis, or fracture.   Plan: Home Medications- Norco; Home Treatments- rest, elevate; Recommended follow up- PCP in 2-3 days             Flint Melter, MD 02/16/12 [phone]

## 2012-02-15 NOTE — ED Notes (Signed)
Left thigh swelling started yesterday, obvious swelling, positive pulses left foot

## 2012-02-15 NOTE — ED Notes (Signed)
Pt states he started to have left leg pain yesterday. Pt has left upper leg swelling and painful to ambulate. Pt denies any injury to leg

## 2012-02-17 ENCOUNTER — Ambulatory Visit (INDEPENDENT_AMBULATORY_CARE_PROVIDER_SITE_OTHER)
Admission: RE | Admit: 2012-02-17 | Discharge: 2012-02-17 | Disposition: A | Discharge status: Home / Self Care | Payer: Medicare Other | Source: Ambulatory Visit | Attending: Internal Medicine | Admitting: Internal Medicine

## 2012-02-17 ENCOUNTER — Ambulatory Visit (INDEPENDENT_AMBULATORY_CARE_PROVIDER_SITE_OTHER): Payer: Medicare Other | Admitting: Internal Medicine

## 2012-02-17 ENCOUNTER — Other Ambulatory Visit (INDEPENDENT_AMBULATORY_CARE_PROVIDER_SITE_OTHER): Payer: Medicare Other

## 2012-02-17 ENCOUNTER — Encounter: Payer: Self-pay | Admitting: Internal Medicine

## 2012-02-17 VITALS — BP 120/80 | HR 107 | Temp 97.3°F | Ht 71.0 in | Wt 128.0 lb

## 2012-02-17 DIAGNOSIS — M79605 Pain in left leg: Secondary | ICD-10-CM

## 2012-02-17 DIAGNOSIS — I10 Essential (primary) hypertension: Secondary | ICD-10-CM

## 2012-02-17 DIAGNOSIS — M79609 Pain in unspecified limb: Secondary | ICD-10-CM

## 2012-02-17 DIAGNOSIS — J449 Chronic obstructive pulmonary disease, unspecified: Secondary | ICD-10-CM

## 2012-02-17 LAB — SEDIMENTATION RATE: Sed Rate: 48 mm/hr — ABNORMAL HIGH (ref 0–22)

## 2012-02-17 LAB — BASIC METABOLIC PANEL
BUN: 26 mg/dL — ABNORMAL HIGH (ref 6–23)
CO2: 24 mEq/L (ref 19–32)
Calcium: 9.1 mg/dL (ref 8.4–10.5)
Chloride: 102 mEq/L (ref 96–112)
GFR: 55.71 mL/min — ABNORMAL LOW (ref >60.00)
Sodium: 134 mEq/L — ABNORMAL LOW (ref 135–145)

## 2012-02-17 LAB — CBC WITH DIFFERENTIAL/PLATELET
Eosinophils Relative: 0.2 % (ref 0.0–5.0)
HCT: 25.7 % — ABNORMAL LOW (ref 39.0–52.0)
Lymphocytes Relative: 16.4 % (ref 12.0–46.0)
Lymphs Abs: 1.3 10*3/uL (ref 0.7–4.0)
Monocytes Relative: 10.5 % (ref 3.0–12.0)
Platelets: 132 10*3/uL — ABNORMAL LOW (ref 150.0–400.0)
WBC: 8.1 10*3/uL (ref 4.5–10.5)

## 2012-02-17 LAB — HEPATIC FUNCTION PANEL
AST: 22 U/L (ref 0–37)
Albumin: 3.7 g/dL (ref 3.5–5.2)
Total Protein: 7.6 g/dL (ref 6.0–8.3)

## 2012-02-17 NOTE — Patient Instructions (Addendum)
Please walk with crutches for now, and no new medications at this time Continue all other medications as before Please see [facility] now for "stat" left leg CT scan for the left thigh Please go to LAB in the Basement for the blood and/or urine tests to be done today Further treatment or other recommendations for treatment will depend on the results

## 2012-02-17 NOTE — Assessment & Plan Note (Addendum)
With marked swelling diffusely to left thigh (none below left knee), mostly anteriorly and possible knee effusion; Not tender over greater trochanter - o/w neurovasc intact and no skin change except faint erythema and warmth/tender;  S/p recent right low back BM biopsy with local swelling persistent to the site today, firm but non tender, nonfluctuant  And no drainage  Cant r/o infectious myositis or DVT vs other; no evidence for compartment syndrome, no fever and only pain with ambulating - for crutches, hold med tx such as pain med/antibx for now; for CT leg now, and labs including cbc, esr, blood cx

## 2012-02-18 ENCOUNTER — Encounter: Payer: Self-pay | Admitting: Internal Medicine

## 2012-02-18 ENCOUNTER — Other Ambulatory Visit: Payer: BC Managed Care – PPO

## 2012-02-18 ENCOUNTER — Ambulatory Visit: Payer: BC Managed Care – PPO | Admitting: Neurosurgery

## 2012-02-19 ENCOUNTER — Encounter: Payer: Self-pay | Admitting: Internal Medicine

## 2012-02-19 NOTE — Assessment & Plan Note (Signed)
stable overall by hx and exam, most recent data reviewed with pt, and pt to continue medical treatment as before SpO2 Readings from Last 3 Encounters:  02/17/12 97%  02/15/12 100%  02/02/12 99%

## 2012-02-19 NOTE — Progress Notes (Signed)
Subjective:    Patient ID: Mathew Postiglione, male    DOB: 04/16/1935, 77 y.o.   MRN: [id number]  HPI  Here to c/o left leg pain since aug 9 ; Is s/p bone marrow bx at right lower back aug 6, seen in ER without diagnosis aug 10, now here with cont'd and worsening pain, intermittent, dull assoc with rather marked left anterior thigh swelling without other more distal LE pain/swelling/numb below the knee or claudiation;  Only hurts to walk, and plans to get crutches to help since pain and swelling not improving; No low back pain, recent falls, trauma, or fever, chills.  Pt denies chest pain, increased sob or doe, wheezing, orthopnea, PND, increased LE swelling, palpitations, dizziness or syncope.  Pt denies new neurological symptoms such as new headache, or facial or extremity weakness or numbness   Pt denies polydipsia, polyuria. No overt bleeding or bruising,  Not on anticoag or antiplt.  Has hx of anemia/TCP.  Has hx of elev LFT's.  Recent INR 1.1 - aug 7 Past Medical History  Diagnosis Date  . AAA (abdominal aortic aneurysm)   . Thrombocytopenia   . Abdominal aneurysm without mention of rupture 04/23/2011  . Peripheral arterial disease 04/23/2011  . COPD (chronic obstructive pulmonary disease) 05/06/2011  . Shortness of breath   . Anemia   . Iron deficiency anemia 05/26/2011  . Hyperlipidemia 08/30/2011  . Liver damage 1960's    Several liver bx due to elevated liver functions   Past Surgical History  Procedure Date  . Abdominal aortic aneurysm repair   . Pr vein bypass graft,aorto-fem-pop   . Esophagogastroduodenoscopy 06/10/2011    Procedure: ESOPHAGOGASTRODUODENOSCOPY (EGD);  Surgeon: Petra Kuba, MD;  Location: [facility] ENDOSCOPY;  Service: Endoscopy;  Laterality: N/A;    reports that he has been smoking Cigarettes.  He has a 12.5 pack-year smoking history. He has never used smokeless tobacco. He reports that he does not drink alcohol or use illicit drugs. family history includes COPD in his  brother; Cancer in his mother; Diabetes in his sister; and Hypertension in his father. Allergies  Allergen Reactions  . Levofloxacin Other (See Comments)    HEMOLYTIC ANEMIA   Current Outpatient Prescriptions on File Prior to Visit  Medication Sig Dispense Refill  . albuterol (PROVENTIL HFA;VENTOLIN HFA) 108 (90 BASE) MCG/ACT inhaler Inhale 1-2 puffs into the lungs every 4 (four) hours as needed. Wheezing      . aspirin 81 MG EC tablet Take 81 mg by mouth daily before breakfast. Swallow whole.      Marland Kitchen atorvastatin (LIPITOR) 10 MG tablet Take 10 mg by mouth daily before breakfast.      . ferrous fumarate (HEMOCYTE - 106 MG FE) 325 (106 FE) MG TABS Take 1 tablet (106 mg of iron total) by mouth daily.  30 each  3  . folic acid (FOLVITE) 1 MG tablet Take 1 mg by mouth daily.      . pantoprazole (PROTONIX) 40 MG tablet Take 40 mg by mouth daily.      Marland Kitchen amLODipine (NORVASC) 5 MG tablet Take 5 mg by mouth daily.      Marland Kitchen HYDROcodone-acetaminophen (NORCO/VICODIN) 5-325 MG per tablet Take 1 tablet by mouth every 4 (four) hours as needed for pain.  10 tablet  0  . methimazole (TAPAZOLE) 5 MG tablet Take 1 tablet (5 mg total) by mouth daily.  30 tablet  1  . metoprolol tartrate (LOPRESSOR) 25 MG tablet Take 12.5 mg by mouth 2 (  two) times daily.       Review of Systems Constitutional: Negative for diaphoresis and unexpected weight change.  HENT: Negative for drooling and tinnitus.   Eyes: Negative for photophobia and visual disturbance.  Respiratory: Negative for choking and stridor.   Gastrointestinal: Negative for vomiting and blood in stool.  Genitourinary: Negative for hematuria and decreased urine volume.  Musculoskeletal: Negative for acute joint swelling, though left knee has ? swelling Skin: Negative for color change and wound.  Neurological: Negative for tremors and numbness.  Psychiatric/Behavioral: Negative for decreased concentration. The patient is not hyperactive.      Objective:    Physical Exam BP 120/80  Pulse 107  Temp 97.3 F (36.3 C) (Oral)  Ht 5\' 11"  (1.803 m)  Wt 128 lb (58.06 kg)  BMI 17.85 kg/m2  SpO2 97% Physical Exam  VS noted, not ill appearing Constitutional: Pt appears well-developed and well-nourished.  HENT: Head: Normocephalic.  Right Ear: External ear normal.  Left Ear: External ear normal.  Eyes: Conjunctivae and EOM are normal. Pupils are equal, round, and reactive to light.  Neck: Normal range of motion. Neck supple.  Cardiovascular: Normal rate and regular rhythm.   Pulmonary/Chest: Effort normal and breath sounds decreased bilat.  Abd:  Soft, NT, non-distended, + BS Neurological: Pt is alert. Not confused Skin: Skin is warm. No erythema. No bruising though has faint erythema possibly to left anterior thigh with rather large nondiscrete area diffuse swelling/tender without fluctuance, other skin change or drainage;  Right low back BM biopsy site firm/raised but nontender, non fluctuance, no drainage Left knee with ? Trace effusion Dorsalis pedis 1+ bilat, Left calf nontender, nonswollen Psychiatric: Pt behavior is normal. Thought content normal.     Assessment & Plan:

## 2012-02-19 NOTE — Assessment & Plan Note (Signed)
stable overall by hx and exam, most recent data reviewed with pt, and pt to continue medical treatment as before BP Readings from Last 3 Encounters:  02/17/12 120/80  02/15/12 125/87  02/02/12 140/77  '

## 2012-02-21 ENCOUNTER — Encounter: Payer: Self-pay | Admitting: Internal Medicine

## 2012-02-21 ENCOUNTER — Other Ambulatory Visit (INDEPENDENT_AMBULATORY_CARE_PROVIDER_SITE_OTHER): Payer: Medicare Other

## 2012-02-21 ENCOUNTER — Ambulatory Visit (INDEPENDENT_AMBULATORY_CARE_PROVIDER_SITE_OTHER): Payer: Medicare Other | Admitting: Internal Medicine

## 2012-02-21 VITALS — BP 124/80 | HR 56 | Temp 96.3°F | Ht 71.0 in | Wt 126.5 lb

## 2012-02-21 DIAGNOSIS — S8010XA Contusion of unspecified lower leg, initial encounter: Secondary | ICD-10-CM

## 2012-02-21 DIAGNOSIS — N289 Disorder of kidney and ureter, unspecified: Secondary | ICD-10-CM

## 2012-02-21 DIAGNOSIS — D509 Iron deficiency anemia, unspecified: Secondary | ICD-10-CM

## 2012-02-21 LAB — CBC WITH DIFFERENTIAL/PLATELET
Basophils Absolute: 0 10*3/uL (ref 0.0–0.1)
Eosinophils Absolute: 0.1 10*3/uL (ref 0.0–0.7)
HCT: 25.6 % — ABNORMAL LOW (ref 39.0–52.0)
Hemoglobin: 8.6 g/dL — ABNORMAL LOW (ref 13.0–17.0)
Lymphs Abs: 1.4 10*3/uL (ref 0.7–4.0)
MCHC: 33.6 g/dL (ref 30.0–36.0)
MCV: 85.2 fl (ref 78.0–100.0)
Monocytes Absolute: 0.7 10*3/uL (ref 0.1–1.0)
Neutro Abs: 5.5 10*3/uL (ref 1.4–7.7)
Platelets: 201 10*3/uL (ref 150.0–400.0)
RDW: 14.8 % — ABNORMAL HIGH (ref 11.5–14.6)

## 2012-02-21 LAB — BASIC METABOLIC PANEL
BUN: 21 mg/dL (ref 6–23)
CO2: 24 mEq/L (ref 19–32)
Chloride: 105 mEq/L (ref 96–112)
Creatinine, Ser: 1.5 mg/dL (ref 0.4–1.5)
Potassium: 4.6 mEq/L (ref 3.5–5.1)

## 2012-02-21 LAB — IBC PANEL: Iron: 49 ug/dL (ref 42–165)

## 2012-02-21 LAB — PROTIME-INR: Prothrombin Time: 11.6 s (ref 10.2–12.4)

## 2012-02-21 NOTE — Assessment & Plan Note (Addendum)
Spontaneous, without fever or evidecne for infection or orthostasis, today with improved pain, ambulation and able to do ADL's; no need for PT at this time or cane/walker,  to f/u any worsening symptoms or concerns, for INR with labs

## 2012-02-21 NOTE — Progress Notes (Signed)
Subjective:    Patient ID: [name], male    DOB: [birth year], 77 y.o.   MRN: [id number]  HPI  Here to f/u; fortunately improved with less leg pain and swelling, no redness, no knee pain, and no fever or back pain as well.  Pt denies chest pain, increased sob or doe, wheezing, orthopnea, PND, increased LE swelling, palpitations, dizziness or syncope.   Pt denies polydipsia, polyuria.  Pt denies new neurological symptoms such as new headache, or facial or extremity weakness or numbness .  No leg pain below the knee, overall ambulating well without need for cane or other assist.  No other overt bleeding or bruising.  Denies orthostasis or falls.  Went to a football game the day after the CT last wk.  No new complaints.  Has f/u with heme early september Past Medical History  Diagnosis Date  . AAA (abdominal aortic aneurysm)   . Thrombocytopenia   . Abdominal aneurysm without mention of rupture 04/23/2011  . Peripheral arterial disease 04/23/2011  . COPD (chronic obstructive pulmonary disease) 05/06/2011  . Shortness of breath   . Anemia   . Iron deficiency anemia 05/26/2011  . Hyperlipidemia 08/30/2011  . Liver damage 1960's    Several liver bx due to elevated liver functions  . Renal insufficiency 02/21/2012   Past Surgical History  Procedure Date  . Abdominal aortic aneurysm repair   . Pr vein bypass graft,aorto-fem-pop   . Esophagogastroduodenoscopy 06/10/2011    Procedure: ESOPHAGOGASTRODUODENOSCOPY (EGD);  Surgeon: Petra Kuba, MD;  Location: [facility] ENDOSCOPY;  Service: Endoscopy;  Laterality: N/A;    reports that he has been smoking Cigarettes.  He has a 12.5 pack-year smoking history. He has never used smokeless tobacco. He reports that he does not drink alcohol or use illicit drugs. family history includes COPD in his brother; Cancer in his mother; Diabetes in his sister; and Hypertension in his father. Allergies  Allergen Reactions  . Levofloxacin Other (See Comments)    HEMOLYTIC  ANEMIA   Current Outpatient Prescriptions on File Prior to Visit  Medication Sig Dispense Refill  . albuterol (PROVENTIL HFA;VENTOLIN HFA) 108 (90 BASE) MCG/ACT inhaler Inhale 1-2 puffs into the lungs every 4 (four) hours as needed. Wheezing      . amLODipine (NORVASC) 5 MG tablet Take 5 mg by mouth daily.      Marland Kitchen aspirin 81 MG EC tablet Take 81 mg by mouth daily before breakfast. Swallow whole.      Marland Kitchen atorvastatin (LIPITOR) 10 MG tablet Take 10 mg by mouth daily before breakfast.      . ferrous fumarate (HEMOCYTE - 106 MG FE) 325 (106 FE) MG TABS Take 1 tablet (106 mg of iron total) by mouth daily.  30 each  3  . folic acid (FOLVITE) 1 MG tablet Take 1 mg by mouth daily.      . methimazole (TAPAZOLE) 5 MG tablet Take 1 tablet (5 mg total) by mouth daily.  30 tablet  1  . HYDROcodone-acetaminophen (NORCO/VICODIN) 5-325 MG per tablet Take 1 tablet by mouth every 4 (four) hours as needed for pain.  10 tablet  0  . metoprolol tartrate (LOPRESSOR) 25 MG tablet Take 12.5 mg by mouth 2 (two) times daily.      . pantoprazole (PROTONIX) 40 MG tablet Take 40 mg by mouth daily.       Review of Systems Review of Systems  Constitutional: Negative for diaphoresis and unexpected weight change.  HENT: Negative  tinnitus.  Eyes: Negative for photophobia and visual disturbance.  Respiratory: Negative for choking and stridor.   Gastrointestinal: Negative for vomiting and blood in stool.  Genitourinary: Negative for hematuria and decreased urine volume.  Musculoskeletal: Negative for gait problem.  Skin: Negative for color change and wound.  Neurological: Negative for tremors and numbness.    Objective:   Physical Exam BP 124/80  Pulse 56  Temp 96.3 F (35.7 C) (Oral)  Ht 5\' 11"  (1.803 m)  Wt 126 lb 8 oz (57.38 kg)  BMI 17.64 kg/m2  SpO2 98% Physical Exam  VS noted, not ill appearing Constitutional: Pt appears well-developed and well-nourished.  HENT: Head: Normocephalic.  Right Ear: External ear  normal.  Left Ear: External ear normal.  Eyes: Conjunctivae and EOM are normal. Pupils are equal, round, and reactive to light.  Neck: Normal range of motion. Neck supple.  Cardiovascular: Normal rate and regular rhythm.   Pulmonary/Chest: Effort normal and breath sounds normal.  Left thigh still swollen but mild now, mild diffuse tender, no skin change or erythema, nonfluctuance, no drainage Neurological: Pt is alert. Not confused, LE motor/gait intact Skin: Skin is warm. No erythema.  Psychiatric: Pt behavior is normal. Thought content normal.     Assessment & Plan:

## 2012-02-21 NOTE — Patient Instructions (Addendum)
Your leg pain and swelling was due to bleeding into the muscle of the left thigh, resulting in severe anemia Please increase your iron pill to twice per day Please go to LAB in the Basement for the blood and/or urine tests to be done today You will be contacted by phone if any changes need to be made immediately.  Otherwise, you will receive a letter about your results with an explanation. Please keep your appointments with your specialists as you have planned - Dr Darrold Span with blood work at that time as well

## 2012-02-21 NOTE — Assessment & Plan Note (Addendum)
Mild worsening with recent leg hematoma - for f/u today Lab Results  Component Value Date   CREATININE 1.5 02/21/2012

## 2012-02-21 NOTE — Assessment & Plan Note (Addendum)
With large hgb drop with blood loss into leg - to hgb 8.5, tolerated remarkably well, to increase the iron to bid, for f/u labs today including INR, as well as f/u with [name] as planned sept 6

## 2012-02-23 LAB — CULTURE, BLOOD (SINGLE): Organism ID, Bacteria: NO GROWTH

## 2012-03-03 ENCOUNTER — Ambulatory Visit (HOSPITAL_BASED_OUTPATIENT_CLINIC_OR_DEPARTMENT_OTHER): Payer: Medicare Other | Admitting: Oncology

## 2012-03-03 ENCOUNTER — Telehealth: Payer: Self-pay | Admitting: Oncology

## 2012-03-03 ENCOUNTER — Ambulatory Visit (HOSPITAL_BASED_OUTPATIENT_CLINIC_OR_DEPARTMENT_OTHER): Payer: BC Managed Care – PPO

## 2012-03-03 ENCOUNTER — Telehealth: Payer: Self-pay

## 2012-03-03 ENCOUNTER — Encounter: Payer: Self-pay | Admitting: Oncology

## 2012-03-03 VITALS — BP 120/80 | HR 88 | Temp 96.8°F | Resp 18 | Ht 71.0 in | Wt 128.0 lb

## 2012-03-03 DIAGNOSIS — D689 Coagulation defect, unspecified: Secondary | ICD-10-CM

## 2012-03-03 DIAGNOSIS — I739 Peripheral vascular disease, unspecified: Secondary | ICD-10-CM

## 2012-03-03 DIAGNOSIS — D591 Autoimmune hemolytic anemia, unspecified: Secondary | ICD-10-CM

## 2012-03-03 DIAGNOSIS — D696 Thrombocytopenia, unspecified: Secondary | ICD-10-CM

## 2012-03-03 DIAGNOSIS — I2699 Other pulmonary embolism without acute cor pulmonale: Secondary | ICD-10-CM

## 2012-03-03 LAB — CBC WITH DIFFERENTIAL/PLATELET
BASO%: 0.8 % (ref 0.0–2.0)
Basophils Absolute: 0.1 10*3/uL (ref 0.0–0.1)
EOS%: 0.9 % (ref 0.0–7.0)
HGB: 10.3 g/dL — ABNORMAL LOW (ref 13.0–17.1)
MCH: 29 pg (ref 27.2–33.4)
MCHC: 33.7 g/dL (ref 32.0–36.0)
RDW: 16 % — ABNORMAL HIGH (ref 11.0–14.6)
WBC: 6 10*3/uL (ref 4.0–10.3)
lymph#: 1.3 10*3/uL (ref 0.9–3.3)

## 2012-03-03 NOTE — Telephone Encounter (Signed)
Gave pt apt for October 15th lab and MD, sent patient to labs

## 2012-03-03 NOTE — Telephone Encounter (Signed)
Spoke with [name] and told him that his Hgb was much better today.  It was 10.3.  It had been 8.6 on 02-21-12. Pt. Verbalized understanding.

## 2012-03-03 NOTE — Progress Notes (Signed)
OFFICE PROGRESS NOTE   03/03/2012   Physicians:B.Chen, J.John, S.Ellison, D.Grapey, W.Outlaw   INTERVAL HISTORY:  Patient is seen, together with wife, in follow up of bone marrow exam done 02-02-12, this visit rescheduled from 02-08-12 at patient's request.  I have learned today that he had problems with bleeding from the right iliac crest bone marrow site ~ a week after that procedure such that he was seen "at ED" (? If this was an urgent care, as I do not find documentation in EMR) and then again on 02-15-12 at [facility] ED due to hematoma without known trauma left thigh. CBC 02-02-12 had hgb 11.7; this was down to 8.5 on 02-17-12 and 8.6 on 8-26-13at follow up visit by Dr Jonny Ruiz. HE has been on bid oral iron since last week. He has had no further bleeding from bone marrow site and near resolution of left thigh pain and swelling. Other labs during that time included INR of 1.1 and plt 201k on 02-21-12 negative blood culture and normal LFTs 8-22; PTT 37 on 02-02-12. Xrays left femur were unremarkable on 02-15-12.  Patient presented Sept 2012 with bleeding into RUE, with WBC 6.8, hgb 11 and plt 100k. Work up then included CT with normal spleen size and no adenopathy but with large AAA, no cirrhosis, iron deficiency, no + hemoccults, no vonWillebrand's, no HIT or myeloma, not obviously ITP. He had aortobifem bypass 03-31-2011, with pulmonary problems post operatively. He was admitted in early Dec with PE and new hemolytic anemia, with platelet count still slightly low. IVC filter was placed and he has not been on further anticoagulation. He had intermittent hematuria. He has not yet had endoscopy. Dr.Grapey did not find microhematuria, but his note recommends cystoscopy due to other risk factors. He had upper endoscopy by [facility], but had not had lower endoscopy as of July.  The bone marrow exam ([id number])did not show any definite problems, tho it was somewhat hypercellular for age at 40-50% with trilineage  hematopoiesis, some nonspecific changes, increased iron without ringed sideroblasts and abundant megakaryocytes. Cytogenetics ([id number])done at [facility] were normal, that report scanned in media section.  Patient tells me that he is feeling well today. He denies pain left thigh, at bone marrow site or elsewhere. He denies increased shortness of breath or cough/ sputum. Appetite has been better. He has had no fever. He denies any trauma to the leg and no other bleeding. Remainder of 10 point Review of Systems negative.  Objective:  Vital signs in last 24 hours:  BP 120/80  Pulse 88  Temp 96.8 F (36 C)  Resp 18  Ht 5\' 11"  (1.803 m)  Wt 128 lb (58.06 kg)  BMI 17.85 kg/m2 Weight is up 2 lbs. Ambulatory without assistance, appears comfortable. Wife is helpful with history.  HEENT:PERRLA, sclera clear, anicteric and oropharynx clear, no lesions LymphaticsCervical, supraclavicular, and axillary nodes normal. Resp: hyperresonant to percussion, clear to auscultation Cardio: regular rate and rhythm GI: soft, non-tender; bowel sounds normal; no masses,  no organomegaly Extremities: no edema, cords, tenderness. Minimal fullness without tenderness or heat left anterior thigh.Decreased hair growth LE. Back: bone marrow site right posterior iliac crest 1x2 cm and firm but not tender, no discoloration suggesting tracking hematoma towards left. Skin without rash or petechiae  Lab Results: WBC 6.0, ANC 4.1, Hgb up to 10.3, plt 155k, RDW 16, segs 68%, lymphs 21%  Platelet aggregation study sent and pending vonWillebrand testing repeated as levels were all slightly elevated in 02-2011 ? Acute phase  reactant  Studies/Results:  No results found.  Medications: I have reviewed the patient's current medications. With increased iron in bone marrow, he does not need to continue oral iron.  Assessment/Plan: 1.excessive bleeding from bone marrow site ~ 1 week after procedure, hematoma left thigh, history of  bleeding into RUE  Sept 2012: etiology not clear, labs pending above. No evidence of bleeding now. 2.previous iron deficiency anemia: source not identified, good iron stores in bone marrow now. Hemoglobin increasing since the recent bleeding episode. 3.hemolytic anemia Dec 2012  4.PE Dec 2012: IVC filter in 5.variable thrombocytopenia: good megakaryocytes in bone marrow. 6.past tobacco, COPD, previous bronchitis 7. AAA and peripheral vascular disease: vascular surgery Oct 2012  I will see him with repeat CBC in Oct. He has next visit with Dr Jonny Ruiz in Nov. He should have flu shot this fall.  Patient and wife were in agreement with plan above.   [name],Grant P, MD   03/03/2012, 10:54 AM

## 2012-03-03 NOTE — Patient Instructions (Signed)
You need to get a flu shot this fall.  We will let you know about the labs from today.

## 2012-03-07 LAB — VON WILLEBRAND PANEL
Coagulation Factor VIII: 173 % — ABNORMAL HIGH (ref 73–140)
Von Willebrand Antigen, Plasma: 199 % (ref 50–217)

## 2012-03-10 LAB — PLATELET AGGREGATION STUDY, BLOOD: Collagen Aggregation: NORMAL

## 2012-03-13 ENCOUNTER — Telehealth: Payer: Self-pay

## 2012-03-13 NOTE — Telephone Encounter (Signed)
Told Mr. Tinson that this blood testing shows a problem in  the platelets themselves.  They are not working properly.  Dr. Darrold Span thinks this is the reason for his bleeding problems.  There is no medication for this unless there is bleeding, but at least the reason was found.  Pt. Verbalized understanding.

## 2012-03-22 ENCOUNTER — Telehealth: Payer: Self-pay | Admitting: Oncology

## 2012-03-22 NOTE — Telephone Encounter (Signed)
Note received from Dr Dulce Sellar that colonoscopy is planned, per that office now procedure is scheduled for Apr 06, 2012. Our office will let patient know to hold ASA beginning Oct 1 until ok per GI after procedure, this due to platelet defect/ bleeding tendency. If bleeding, transfusing platelets would be appropriate. Ila Mcgill, MD

## 2012-03-24 ENCOUNTER — Telehealth: Payer: Self-pay

## 2012-03-24 NOTE — Telephone Encounter (Signed)
Told [name] that Dr. Darrold Span wants him to stop taking his ASA after Sept. 30, 2013 until the GI doctor tells him he can resume med.  This is a little longer than GI told him.  Patient wrote instructions down and repeated them back to this nurse correctly.

## 2012-04-06 ENCOUNTER — Other Ambulatory Visit: Payer: Self-pay | Admitting: Gastroenterology

## 2012-04-11 ENCOUNTER — Telehealth: Payer: Self-pay | Admitting: Oncology

## 2012-04-11 ENCOUNTER — Ambulatory Visit (HOSPITAL_BASED_OUTPATIENT_CLINIC_OR_DEPARTMENT_OTHER): Payer: Medicare Other | Admitting: Oncology

## 2012-04-11 ENCOUNTER — Other Ambulatory Visit (HOSPITAL_BASED_OUTPATIENT_CLINIC_OR_DEPARTMENT_OTHER): Payer: Medicare Other | Admitting: Lab

## 2012-04-11 ENCOUNTER — Encounter: Payer: Self-pay | Admitting: Oncology

## 2012-04-11 ENCOUNTER — Other Ambulatory Visit: Payer: Self-pay | Admitting: *Deleted

## 2012-04-11 VITALS — BP 128/85 | HR 80 | Temp 97.8°F | Resp 18 | Ht 71.0 in | Wt 128.5 lb

## 2012-04-11 DIAGNOSIS — D696 Thrombocytopenia, unspecified: Secondary | ICD-10-CM

## 2012-04-11 DIAGNOSIS — Z86711 Personal history of pulmonary embolism: Secondary | ICD-10-CM

## 2012-04-11 DIAGNOSIS — D599 Acquired hemolytic anemia, unspecified: Secondary | ICD-10-CM

## 2012-04-11 DIAGNOSIS — D649 Anemia, unspecified: Secondary | ICD-10-CM

## 2012-04-11 LAB — CBC WITH DIFFERENTIAL/PLATELET
Basophils Absolute: 0 10*3/uL (ref 0.0–0.1)
Eosinophils Absolute: 0 10*3/uL (ref 0.0–0.5)
HGB: 9 g/dL — ABNORMAL LOW (ref 13.0–17.1)
NEUT#: 3.8 10*3/uL (ref 1.5–6.5)
RDW: 14.8 % — ABNORMAL HIGH (ref 11.0–14.6)
lymph#: 1.5 10*3/uL (ref 0.9–3.3)

## 2012-04-11 NOTE — Progress Notes (Signed)
OFFICE PROGRESS NOTE   04/11/2012   Physicians:B.Chen, J.John, S.Ellison, D.Grapey, W.Outlaw   INTERVAL HISTORY:  Patient is seen, alone for visit, in scheduled follow up of his hematologic problems, which include iron deficiency anemia, hemolytic anemia possibly drug related in Dec 2012, and variable thrombocytopenia with good megakaryocytes in bone marrow Aug 2013. He has also had unusual bleeding into RUE in Sept 2012, excessive bleeding from the bone marrow site with hematoma into left thigh, and pulmonary embolus Dec 2012 after vascular surgery (IVC filter in). 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 Patient tells me that he had colonoscopy by Dr Dulce Sellar on 04-06-12, then passed some noticeable amount of "old blood" on 04-10-12. He is to see Dr Dulce Sellar to discuss colonoscopy upcoming. Following visit, I have received information from Dr Hulen Shouts office, including colonoscopy report: 3 benign sessile polyps in ascending colon and cecum 5-20 mm, a benign 5 mm sessile polyp in rectum and a 25mm serpiginous sessile polyp in transverse colon with central umbilication and not endoscopically resectable. 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 Dr Hulen Shouts note plans surgical consultation after his upcoming visit. Colonoscopy report and path [facility] [phone] from 04-06-12) to be scanned into this EMR. He has follow up visit with Dr Jonny Ruiz on 05-04-12.  Patient presented Sept 2012 with bleeding into RUE, with WBC 6.8, hgb 11 and plt 100k. Work up then included CT with normal spleen size and no adenopathy but with large AAA, no cirrhosis, iron deficiency, no + hemoccults, no vonWillebrand's, no HIT or myeloma, not obviously ITP. He had aortobifem bypass 03-31-2011, with pulmonary problems post operatively. He was admitted in early Dec with PE and new  hemolytic anemia, with platelet count still slightly low. IVC filter was placed and he has not been on further anticoagulation. He had intermittent hematuria. He has not yet had endoscopy. Dr.Grapey did not find microhematuria, but his note recommends cystoscopy due to other risk factors. He had upper endoscopy by [facility] before the colonoscopy 04-06-12. Bone marrow exam in Aug 2013 did not show any definite problems, tho it was somewhat hypercellular for age at 40-50% with trilineage hematopoiesis, some nonspecific changes, increased iron without ringed sideroblasts and abundant megakaryocytes. Cytogenetics done at [facility] were normal, that report scanned in media section.  Patient denies any other bleeding. He has not resumed ANY meds since colonoscopy, as he was under impression that he was to take nothing for one week  (I believe instructions were to avoid ASA and NSAID x 1 week).He tells me that he is feeling very well, denies SOB or other respiratory symptoms, pain, other GI symptoms, fever, lower extremity swelling. Appetite is good. Remainder of 10 point Review of Systems negative.  He attended [employer] football this past weekend and plans to go fishing after this visit. Objective:  Vital signs in last 24 hours:  BP 128/85  Pulse 80  Temp 97.8 F (36.6 C) (Oral)  Resp 18  Ht 5\' 11"  (1.803 m)  Wt 128 lb 8 oz (58.287 kg)  BMI 17.92 kg/m2 Easily ambulatory, looks comfortable, respirations not labored RA. Very pleasant as always.   HEENT:PERRLA, sclera clear, anicteric, oropharynx clear, no lesions and neck supple with midline trachea Mucous membranes moist LymphaticsCervical, supraclavicular, and axillary nodes normal. Resp: hyperresonant to percussion, diminished BS but otherwise clear bilaterally. Back nontender Cardio: regular rate and  rhythm no gallop GI: soft, non-tender; bowel sounds normal; no masses,  no organomegaly Extremities: extremities normal, atraumatic, no  cyanosis or edema Neuro:grossly nonfocal Skin without ecchymosis or petechiae.  Lab Results:  Results for orders placed in visit on 04/11/12  CBC WITH DIFFERENTIAL      Component Value Range   WBC 5.9  4.0 - 10.3 10e3/uL   NEUT# 3.8  1.5 - 6.5 10e3/uL   HGB 9.0 (*) 13.0 - 17.1 g/dL   HCT 16.1 (*) 09.6 - 04.5 %   Platelets 105 (*) 140 - 400 10e3/uL   MCV 86.1  79.3 - 98.0 fL   MCH 28.5  27.2 - 33.4 pg   MCHC 33.1  32.0 - 36.0 g/dL   RBC 4.09 (*) 8.11 - 9.14 10e6/uL   RDW 14.8 (*) 11.0 - 14.6 %   lymph# 1.5  0.9 - 3.3 10e3/uL   MONO# 0.5  0.1 - 0.9 10e3/uL   Eosinophils Absolute 0.0  0.0 - 0.5 10e3/uL   Basophils Absolute 0.0  0.0 - 0.1 10e3/uL   NEUT% 64.8  39.0 - 75.0 %   LYMPH% 25.1  14.0 - 49.0 %   MONO% 8.8  0.0 - 14.0 %   EOS% 0.7  0.0 - 7.0 %   BASO% 0.6  0.0 - 2.0 %    Hemoglobin is down from 10.3 on 03-03-12.  Studies/Results:  See colonoscopy report done at [facility] 04-06-12  Medications: I have reviewed the patient's current medications. Patient has brought albuterol inhaler, ASA, iron, atorvastin and folic acid bottles, tho he has not resumed any of these since the colonoscopy. He does not have and does not know if he needs norvasc, tapazole, lopressor and protonix. He thinks he has some meds to pick up at pharmacy. He understands that he needs to discuss BP meds with Dr Jonny Ruiz; I have asked him to continue protonix (unless GI feels otherwise). He has not had flu vaccine yet, but needs this at next visit here or when he sees Dr Jonny Ruiz  Assessment/Plan: 1. Iron deficiency anemia: drop in hemoglobin today seems related to bleeding after colonoscopy and those biopsies. He is not symptomatic. He had adequate iron in bone marrow in August and will resume po iron now. I will recheck labs in ~2wks. I expect previous iron deficiency anemia was related primarily to GI blood loss from polyps/ tubulovillous adenoma 2. Hemolytic anemia Dec 2012 likely drug related. Will recheck labs  for hemolysis with visit in 2 wks. Continues po folate 3. Variable thrombocytopenia: good megakaryocytes on bone marrow, may be related to bleeding from colonoscopy/ biopsies. Follow. Hold ASA 4.Unusual bleeding episodes and possible platelet defect. Would hold ASA for now 5.Peripheral vascular disease and AAA with bypass surgery Nov 2012: on ASA for this indication previously 6.PE Dec 2012 thought related to the vascular surgery: IVC filter in. No further clots 7.long tobacco, COPD, previous bronchitis. 8.HTN, tachycardia previously. See Medications above.  Note information re colonoscopy and path not available during patient's visit.  Haward Pope P, MD   04/11/2012, 4:18 PM

## 2012-04-11 NOTE — Telephone Encounter (Signed)
Confirmed with [facility] that pt has protonix rx available

## 2012-04-11 NOTE — Telephone Encounter (Signed)
Gave pt apt for November 2013 lab and MD

## 2012-04-11 NOTE — Patient Instructions (Signed)
Do not take aspirin   Start back on iron today  Start back on Protonix   Ask Dr Jonny Ruiz about your other blood pressure medicines.  Call if more bleeding before next appointment with Dr Darrold Span

## 2012-04-18 ENCOUNTER — Encounter: Payer: Self-pay | Admitting: Oncology

## 2012-04-23 ENCOUNTER — Other Ambulatory Visit: Payer: Self-pay | Admitting: Oncology

## 2012-04-23 DIAGNOSIS — D649 Anemia, unspecified: Secondary | ICD-10-CM

## 2012-04-28 ENCOUNTER — Encounter: Payer: Self-pay | Admitting: Oncology

## 2012-04-28 ENCOUNTER — Ambulatory Visit (HOSPITAL_BASED_OUTPATIENT_CLINIC_OR_DEPARTMENT_OTHER): Payer: Medicare Other | Admitting: Oncology

## 2012-04-28 ENCOUNTER — Telehealth: Payer: Self-pay | Admitting: Oncology

## 2012-04-28 ENCOUNTER — Other Ambulatory Visit (HOSPITAL_BASED_OUTPATIENT_CLINIC_OR_DEPARTMENT_OTHER): Payer: Medicare Other

## 2012-04-28 VITALS — BP 111/70 | HR 113 | Temp 99.0°F | Resp 20 | Ht 71.0 in | Wt 130.8 lb

## 2012-04-28 DIAGNOSIS — D696 Thrombocytopenia, unspecified: Secondary | ICD-10-CM

## 2012-04-28 DIAGNOSIS — D72829 Elevated white blood cell count, unspecified: Secondary | ICD-10-CM

## 2012-04-28 DIAGNOSIS — R509 Fever, unspecified: Secondary | ICD-10-CM

## 2012-04-28 DIAGNOSIS — D649 Anemia, unspecified: Secondary | ICD-10-CM

## 2012-04-28 LAB — COMPREHENSIVE METABOLIC PANEL (CC13)
ALT: <6 U/L (ref 0–55)
Alkaline Phosphatase: 101 U/L (ref 40–150)
Sodium: 138 mEq/L (ref 136–145)
Total Bilirubin: 0.81 mg/dL (ref 0.20–1.20)
Total Protein: 7.5 g/dL (ref 6.4–8.3)

## 2012-04-28 LAB — CBC & DIFF AND RETIC
BASO%: 0.1 % (ref 0.0–2.0)
Basophils Absolute: 0 10*3/uL (ref 0.0–0.1)
HCT: 30.6 % — ABNORMAL LOW (ref 38.4–49.9)
HGB: 10.3 g/dL — ABNORMAL LOW (ref 13.0–17.1)
Immature Retic Fract: 9.1 % (ref 3.00–10.60)
MCHC: 33.7 g/dL (ref 32.0–36.0)
MONO#: 1.2 10*3/uL — ABNORMAL HIGH (ref 0.1–0.9)
NEUT%: 86.2 % — ABNORMAL HIGH (ref 39.0–75.0)
RDW: 15.3 % — ABNORMAL HIGH (ref 11.0–14.6)
Retic %: 1.63 % (ref 0.80–1.80)
Retic Ct Abs: 59.33 10*3/uL (ref 34.80–93.90)
WBC: 14.2 10*3/uL — ABNORMAL HIGH (ref 4.0–10.3)
lymph#: 0.7 10*3/uL — ABNORMAL LOW (ref 0.9–3.3)

## 2012-04-28 LAB — IRON AND TIBC
%SAT: 6 % — ABNORMAL LOW (ref 20–55)
TIBC: 187 ug/dL — ABNORMAL LOW (ref 215–435)

## 2012-04-28 LAB — LACTATE DEHYDROGENASE (CC13): LDH: 288 U/L — ABNORMAL HIGH (ref 125–220)

## 2012-04-28 NOTE — Patient Instructions (Addendum)
Call if unusual bruising or bleeding, or if fever or symptoms of infection

## 2012-04-28 NOTE — Telephone Encounter (Signed)
gv and printed pt appt schedule for Dec 2013

## 2012-04-28 NOTE — Progress Notes (Signed)
OFFICE PROGRESS NOTE   04/28/2012   Physicians:B.Chen, J.John, S.Ellison, D.Grapey, W.Outlaw       INTERVAL HISTORY:  Patient is seen, alone for visit, in follow up of lower hemoglobin and platelets at visit 04-11-12, which seemed possibly related to colonoscopy with biopsy of large sessile polyp in transverse colon by Dr Dulce Sellar 04-06-12, with some blood per rectum on 04-10-12 according to patient. Patient is to see Dr Dulce Sellar in the next week or so and does not seem to have known about the larger polyp that could not be resected endoscopically; Dr Hulen Shouts note mentions possible surgical consultation.  Patient has had complicated course since presentation fall 2012 with unusual bleeding into RUE. He has been iron deficient, which was reason for the colonoscopy. 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 He had some microhematuria, or possibly hemoglobinuria, was seen by Dr Isabel Caprice, but may not have followed up with cystoscopy. He also had excessive bleeding from bone marrow site Aug 2013 (requiring 2 ED visits) and has had ASA-like platelet defect, tho not clear if he was off ASA at that testing. The bone marrow was slightly hypercellular with abundant megakaryocytes and increased iron without ringed sideroblasts, and normal cytogenetics.  Patient denies any bleeding including with bowels since he was here last. He tells me that he is feeling entirely well, looking forward to [employer]. He denies any fever, increased shortness of breath, change in usual minimal cough/ some whitish sputum as is baseline. He denies diarrhea, bladder symptoms, sinus congestion or drainage, any pain. Appetite and energy are good. He is wearing long johns today and feels hot with these. Remainder of 10 point Review of Systems negative.  Patient tells me that he  has resumed smoking, 1/3 - 1/2 ppd. We have discussed and I have strongly encouraged him to stop this entirely. Objective:  Vital signs in last 24 hours:  BP 111/70  Pulse 113  Temp 99 F (37.2 C) (Oral)  Resp 20  Ht 5\' 11"  (1.803 m)  Wt 130 lb 12.8 oz (59.33 kg)  BMI 18.24 kg/m2 Alert, talkative, easily mobile, respirations not labored, no cough.  HEENT:PERRLA, sclera clear, anicteric and oropharynx clear, no lesions. No JVD LymphaticsCervical, supraclavicular, and axillary nodes normal. Resp: hyperresonant to percussion and somewhat diminished breath sounds bilaterally without wheezes or crackles Cardio: regular rate and rhythm GI: scaphoid, nontender, no HSM or mass, normal bowel sounds Extremities: extremities normal, atraumatic, no cyanosis or edema Neuro:unchanged Skin without rash or ecchymosis, no petechiae   Lab Results:  Results for orders placed in visit on 04/28/12  LACTATE DEHYDROGENASE (CC13)      Component Value Range   LDH 288 (*) 125 - 220 U/L  COMPREHENSIVE METABOLIC PANEL (CC13)      Component Value Range   Sodium 138  136 - 145 mEq/L   Potassium 3.7  3.5 - 5.1 mEq/L   Chloride 106  98 - 107 mEq/L   CO2 24  22 - 29 mEq/L   Glucose 139 (*) 70 - 99 mg/dl   BUN 16.1  7.0 - 09.6 mg/dL   Creatinine 1.5 (*) 0.7 - 1.3 mg/dL   Total Bilirubin 0.45  0.20 - 1.20 mg/dL   Alkaline Phosphatase 101  40 - 150 U/L   AST 10  5 - 34 U/L   ALT <6  0-55 U/L U/L  Total Protein 7.5  6.4 - 8.3 g/dL   Albumin 3.4 (*) 3.5 - 5.0 g/dL   Calcium 9.1  8.4 - 01.0 mg/dL  HAPTOGLOBIN      Component Value Range   Haptoglobin 231 (*) 45 - 215 mg/dL  IRON AND TIBC      Component Value Range   Iron 11 (*) 42 - 165 ug/dL   UIBC 272  536 - 644 ug/dL   TIBC 034 (*) 742 - 595 ug/dL   %SAT 6 (*) 20 - 55 %  CBC & DIFF AND RETIC      Component Value Range   WBC 14.2 (*) 4.0 - 10.3 10e3/uL   NEUT# 12.2 (*) 1.5 - 6.5 10e3/uL   HGB 10.3 (*) 13.0 - 17.1 g/dL   HCT 63.8 (*) 75.6 -  49.9 %   Platelets 70 (*) 140 - 400 10e3/uL   MCV 84.1  79.3 - 98.0 fL   MCH 28.3  27.2 - 33.4 pg   MCHC 33.7  32.0 - 36.0 g/dL   RBC 4.33 (*) 2.95 - 1.88 10e6/uL   RDW 15.3 (*) 11.0 - 14.6 %   lymph# 0.7 (*) 0.9 - 3.3 10e3/uL   MONO# 1.2 (*) 0.1 - 0.9 10e3/uL   Eosinophils Absolute 0.0  0.0 - 0.5 10e3/uL   Basophils Absolute 0.0  0.0 - 0.1 10e3/uL   NEUT% 86.2 (*) 39.0 - 75.0 %   LYMPH% 5.2 (*) 14.0 - 49.0 %   MONO% 8.5  0.0 - 14.0 %   EOS% 0.0  0.0 - 7.0 %   BASO% 0.1  0.0 - 2.0 %   nRBC 0  0 - 0 %   Retic % 1.63  0.80 - 1.80 %   Retic Ct Abs 59.33  34.80 - 93.90 10e3/uL   Immature Retic Fract 9.10  3.00 - 10.60 %     Studies/Results:  No results found.  Medications: I have reviewed the patient's current medications. He is off ASA. He is not entirely clear about other meds and, again, did not bring these to office. With temperature 99 and lower platelets, I did not feel comfortable giving flu shot today, tho he definitely needs this hopefully by end of Nov.  Assessment/Plan: 1.Anemia: Hgb up from 9.0 after colonoscopy/ apparently some bleeding after that procedure. Not symptomatic. Still on oral iron. Does not appear to be hemolyzing now. Follow 2.Thrombocytopenia: he is to see PCP Nov 7 and I will follow up at least early Dec. No obvious bleeding now. Thrombocytopenia has been variable thru all of this. 3.low grade temperature and more elevated WBC, but no clear infection. He is aware and should seek medical attention if fever or other symptoms of infection 4.COPD, previous lower respiratory infections, ongoing tobacco: as above 5. Peripheral vascular disease: note off ASA due to bleeding problems 6.sessile, umbilicated colon polyp at recent colonoscopy: Dr Dulce Sellar to see in follow up, may need to consider surgery 7.HTN, hx tachycardia 8.PE several weeks after vascular surgery 2012: IVC filter in, as he has not been candidate for anticoagulation 9.still needs flu  shot  Patient seemed to follow discussion and was in agreement with plan  Reece Packer, MD   04/28/2012, 8:33 PM

## 2012-04-29 ENCOUNTER — Emergency Department (HOSPITAL_COMMUNITY): Payer: Medicare Other

## 2012-04-29 ENCOUNTER — Inpatient Hospital Stay (HOSPITAL_COMMUNITY)
Admission: EM | Admit: 2012-04-29 | Discharge: 2012-05-02 | DRG: 872 | Disposition: A | Discharge status: Home / Self Care | Payer: Medicare Other | Attending: Internal Medicine | Admitting: Internal Medicine

## 2012-04-29 ENCOUNTER — Encounter (HOSPITAL_COMMUNITY): Payer: Self-pay | Admitting: Emergency Medicine

## 2012-04-29 ENCOUNTER — Other Ambulatory Visit: Payer: Self-pay

## 2012-04-29 DIAGNOSIS — R001 Bradycardia, unspecified: Secondary | ICD-10-CM

## 2012-04-29 DIAGNOSIS — I739 Peripheral vascular disease, unspecified: Secondary | ICD-10-CM

## 2012-04-29 DIAGNOSIS — D509 Iron deficiency anemia, unspecified: Secondary | ICD-10-CM

## 2012-04-29 DIAGNOSIS — S8010XA Contusion of unspecified lower leg, initial encounter: Secondary | ICD-10-CM

## 2012-04-29 DIAGNOSIS — N179 Acute kidney failure, unspecified: Secondary | ICD-10-CM

## 2012-04-29 DIAGNOSIS — N12 Tubulo-interstitial nephritis, not specified as acute or chronic: Secondary | ICD-10-CM

## 2012-04-29 DIAGNOSIS — I2699 Other pulmonary embolism without acute cor pulmonale: Secondary | ICD-10-CM

## 2012-04-29 DIAGNOSIS — E0590 Thyrotoxicosis, unspecified without thyrotoxic crisis or storm: Secondary | ICD-10-CM

## 2012-04-29 DIAGNOSIS — I714 Abdominal aortic aneurysm, without rupture, unspecified: Secondary | ICD-10-CM

## 2012-04-29 DIAGNOSIS — R6521 Severe sepsis with septic shock: Secondary | ICD-10-CM

## 2012-04-29 DIAGNOSIS — Z95828 Presence of other vascular implants and grafts: Secondary | ICD-10-CM

## 2012-04-29 DIAGNOSIS — J4489 Other specified chronic obstructive pulmonary disease: Secondary | ICD-10-CM | POA: Diagnosis present

## 2012-04-29 DIAGNOSIS — I129 Hypertensive chronic kidney disease with stage 1 through stage 4 chronic kidney disease, or unspecified chronic kidney disease: Secondary | ICD-10-CM | POA: Diagnosis present

## 2012-04-29 DIAGNOSIS — D696 Thrombocytopenia, unspecified: Secondary | ICD-10-CM

## 2012-04-29 DIAGNOSIS — R509 Fever, unspecified: Secondary | ICD-10-CM

## 2012-04-29 DIAGNOSIS — I959 Hypotension, unspecified: Secondary | ICD-10-CM | POA: Diagnosis present

## 2012-04-29 DIAGNOSIS — A419 Sepsis, unspecified organism: Principal | ICD-10-CM

## 2012-04-29 DIAGNOSIS — Z0000 Encounter for general adult medical examination without abnormal findings: Secondary | ICD-10-CM

## 2012-04-29 DIAGNOSIS — N183 Chronic kidney disease, stage 3 unspecified: Secondary | ICD-10-CM

## 2012-04-29 DIAGNOSIS — K921 Melena: Secondary | ICD-10-CM

## 2012-04-29 DIAGNOSIS — J449 Chronic obstructive pulmonary disease, unspecified: Secondary | ICD-10-CM

## 2012-04-29 DIAGNOSIS — F17200 Nicotine dependence, unspecified, uncomplicated: Secondary | ICD-10-CM | POA: Diagnosis present

## 2012-04-29 DIAGNOSIS — D72829 Elevated white blood cell count, unspecified: Secondary | ICD-10-CM

## 2012-04-29 DIAGNOSIS — D5919 Other autoimmune hemolytic anemia: Secondary | ICD-10-CM

## 2012-04-29 DIAGNOSIS — E876 Hypokalemia: Secondary | ICD-10-CM

## 2012-04-29 DIAGNOSIS — N390 Urinary tract infection, site not specified: Secondary | ICD-10-CM | POA: Diagnosis present

## 2012-04-29 DIAGNOSIS — E785 Hyperlipidemia, unspecified: Secondary | ICD-10-CM

## 2012-04-29 DIAGNOSIS — I10 Essential (primary) hypertension: Secondary | ICD-10-CM

## 2012-04-29 DIAGNOSIS — R6520 Severe sepsis without septic shock: Secondary | ICD-10-CM

## 2012-04-29 LAB — COMPREHENSIVE METABOLIC PANEL
ALT: <5 U/L (ref 0–53)
AST: 15 U/L (ref 0–37)
CO2: 22 mEq/L (ref 19–32)
Chloride: 101 mEq/L (ref 96–112)
Creatinine, Ser: 2.27 mg/dL — ABNORMAL HIGH (ref 0.50–1.35)
GFR calc non Af Amer: 27 mL/min — ABNORMAL LOW (ref >90)
Sodium: 135 mEq/L (ref 135–145)
Total Bilirubin: 0.8 mg/dL (ref 0.3–1.2)

## 2012-04-29 LAB — URINE MICROSCOPIC-ADD ON

## 2012-04-29 LAB — CBC WITH DIFFERENTIAL/PLATELET
Basophils Relative: 0 % (ref 0–1)
Eosinophils Relative: 0 % (ref 0–5)
Hemoglobin: 9.1 g/dL — ABNORMAL LOW (ref 13.0–17.0)
Lymphs Abs: 0.9 10*3/uL (ref 0.7–4.0)
MCV: 81.9 fL (ref 78.0–100.0)
Monocytes Relative: 7 % (ref 3–12)
Platelets: 63 10*3/uL — ABNORMAL LOW (ref 150–400)
RBC: 3.2 MIL/uL — ABNORMAL LOW (ref 4.22–5.81)
WBC Morphology: INCREASED
WBC: 18 10*3/uL — ABNORMAL HIGH (ref 4.0–10.5)

## 2012-04-29 LAB — CBC
MCH: 28.8 pg (ref 26.0–34.0)
MCHC: 35 g/dL (ref 30.0–36.0)
Platelets: 57 10*3/uL — ABNORMAL LOW (ref 150–400)
RDW: 15.2 % (ref 11.5–15.5)

## 2012-04-29 LAB — PROTIME-INR: INR: 1.65 — ABNORMAL HIGH (ref 0.00–1.49)

## 2012-04-29 LAB — RETICULOCYTES
RBC.: 2.89 MIL/uL — ABNORMAL LOW (ref 4.22–5.81)
Retic Ct Pct: 1.2 % (ref 0.4–3.1)

## 2012-04-29 LAB — URINALYSIS, ROUTINE W REFLEX MICROSCOPIC
Glucose, UA: NEGATIVE mg/dL
pH: 5 (ref 5.0–8.0)

## 2012-04-29 LAB — HAPTOGLOBIN: Haptoglobin: 240 mg/dL — ABNORMAL HIGH (ref 45–215)

## 2012-04-29 LAB — OCCULT BLOOD, POC DEVICE: Fecal Occult Bld: NEGATIVE

## 2012-04-29 LAB — LACTIC ACID, PLASMA: Lactic Acid, Venous: 2.1 mmol/L (ref 0.5–2.2)

## 2012-04-29 MED ORDER — SODIUM CHLORIDE 0.9 % IV SOLN: INTRAVENOUS | Status: DC

## 2012-04-29 MED ORDER — SODIUM CHLORIDE 0.9 % IJ SOLN
3.0000 mL | Freq: Two times a day (BID) | INTRAMUSCULAR | Status: DC
  Administered 2012-04-29 – 2012-05-02 (×5): 3 mL via INTRAVENOUS

## 2012-04-29 MED ORDER — METHIMAZOLE 5 MG PO TABS
5.0000 mg | ORAL_TABLET | Freq: Every day | ORAL | Status: DC
  Administered 2012-04-29 – 2012-05-02 (×4): 5 mg via ORAL
  Filled 2012-04-29 (×4): qty 1

## 2012-04-29 MED ORDER — FOLIC ACID 1 MG PO TABS
1.0000 mg | ORAL_TABLET | Freq: Every day | ORAL | Status: DC
  Administered 2012-04-29 – 2012-05-02 (×4): 1 mg via ORAL
  Filled 2012-04-29 (×4): qty 1

## 2012-04-29 MED ORDER — ACETAMINOPHEN 325 MG PO TABS: 650.0000 mg | ORAL_TABLET | Freq: Four times a day (QID) | ORAL | Status: DC | PRN

## 2012-04-29 MED ORDER — SODIUM CHLORIDE 0.9 % IV BOLUS (SEPSIS)
500.0000 mL | Freq: Once | INTRAVENOUS | Status: AC
  Administered 2012-04-29: 500 mL via INTRAVENOUS

## 2012-04-29 MED ORDER — ONDANSETRON HCL 4 MG/2ML IJ SOLN: 4.0000 mg | Freq: Four times a day (QID) | INTRAMUSCULAR | Status: DC | PRN

## 2012-04-29 MED ORDER — ALBUTEROL SULFATE HFA 108 (90 BASE) MCG/ACT IN AERS
1.0000 | INHALATION_SPRAY | RESPIRATORY_TRACT | Status: DC | PRN
  Filled 2012-04-29: qty 6.7

## 2012-04-29 MED ORDER — INFLUENZA VIRUS VACC SPLIT PF IM SUSP
0.5000 mL | INTRAMUSCULAR | Status: AC
  Administered 2012-04-30: 0.5 mL via INTRAMUSCULAR
  Filled 2012-04-29: qty 0.5

## 2012-04-29 MED ORDER — ACETAMINOPHEN 650 MG RE SUPP: 650.0000 mg | Freq: Four times a day (QID) | RECTAL | Status: DC | PRN

## 2012-04-29 MED ORDER — FERROUS FUMARATE 325 (106 FE) MG PO TABS
1.0000 | ORAL_TABLET | Freq: Two times a day (BID) | ORAL | Status: DC
  Administered 2012-04-29 – 2012-05-02 (×7): 106 mg via ORAL
  Filled 2012-04-29 (×8): qty 1

## 2012-04-29 MED ORDER — PNEUMOCOCCAL VAC POLYVALENT 25 MCG/0.5ML IJ INJ
0.5000 mL | INJECTION | INTRAMUSCULAR | Status: AC
  Administered 2012-04-30: 0.5 mL via INTRAMUSCULAR
  Filled 2012-04-29: qty 0.5

## 2012-04-29 MED ORDER — SODIUM CHLORIDE 0.9 % IV SOLN
INTRAVENOUS | Status: AC
  Administered 2012-04-29: 150 mL/h via INTRAVENOUS
  Administered 2012-04-29 – 2012-04-30 (×3): via INTRAVENOUS

## 2012-04-29 MED ORDER — DEXTROSE 5 % IV SOLN
1.0000 g | Freq: Once | INTRAVENOUS | Status: AC
  Administered 2012-04-29: 1 g via INTRAVENOUS
  Filled 2012-04-29: qty 10

## 2012-04-29 MED ORDER — ONDANSETRON HCL 4 MG PO TABS: 4.0000 mg | ORAL_TABLET | Freq: Four times a day (QID) | ORAL | Status: DC | PRN

## 2012-04-29 MED ORDER — PANTOPRAZOLE SODIUM 40 MG PO TBEC
40.0000 mg | DELAYED_RELEASE_TABLET | Freq: Every day | ORAL | Status: DC
  Administered 2012-04-29 – 2012-05-02 (×4): 40 mg via ORAL
  Filled 2012-04-29 (×4): qty 1

## 2012-04-29 MED ORDER — ACETAMINOPHEN 325 MG PO TABS
ORAL_TABLET | ORAL | Status: AC
  Administered 2012-04-29: 12:00:00
  Filled 2012-04-29: qty 2

## 2012-04-29 MED ORDER — SODIUM CHLORIDE 0.9 % IV BOLUS (SEPSIS)
1000.0000 mL | Freq: Once | INTRAVENOUS | Status: AC
  Administered 2012-04-29: 1000 mL via INTRAVENOUS

## 2012-04-29 NOTE — ED Notes (Signed)
Pt called nurse c/o cold room temp taked 100.5 oral

## 2012-04-29 NOTE — ED Notes (Signed)
Sbp> 90 after see vital sign sheet

## 2012-04-29 NOTE — ED Notes (Signed)
Called to give report nurse is with another pt she will called me back

## 2012-04-29 NOTE — H&P (Signed)
[facility] History and Physical  [name] WRU:[id number] DOB: 12-05-1934 DOA: 04/29/2012  Referring physician: Dr. Silverio Lay PCP: Oliver Barre, MD  Specialists: [place] [facility]  Chief Complaint: hematuria and fevers  HPI: [name] is a 77 y.o. 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 Darrold Span, also past medical history of AAA status post repair,aortobifem bypass 03-31-2011, iron deficiency anemia, he also had an unusual bleeding from the bone marrow biopsy, past medical history of PE in 2012 status post IVC filter, with a colonoscopy in 04/06/2012.by Dr. Dulce Sellar 2 polyps were removed at that time,chronic renal disease with baseline creatinine 1.5. They comes in for fever chills and hematuria 1 day prior to admission. He relates his urine is malodorous, he also relates that he has not checked his temperature at home but he feels a little bit hot. His temperature here in the emergency room was 100.5. He relates no recent urological procedures. She also relates some anorexia. Denies any chest pain shortness of breath nausea no vomiting no diarrhea. Here in the ED he was found to be hypotensive with a lactic acid of 2.1 his blood pressure responded normal saline.  Today's creatinine is 2.2. So we were asked to admit and further evaluate.  Review of Systems: The patient denies  weight loss,, vision loss, decreased hearing, hoarseness, chest pain, syncope, dyspnea on exertion, peripheral edema, balance deficits, hemoptysis, abdominal pain, melena, hematochezia, severe indigestion/heartburn, hematuria, incontinence, genital sores, muscle weakness, suspicious skin lesions, transient blindness, difficulty walking, depression, unusual weight change, abnormal bleeding, enlarged lymph nodes, angioedema, and breast masses.    Past Medical History  Diagnosis Date  . AAA  (abdominal aortic aneurysm)   . Thrombocytopenia   . Abdominal aneurysm without mention of rupture 04/23/2011  . Peripheral arterial disease 04/23/2011  . COPD (chronic obstructive pulmonary disease) 05/06/2011  . Shortness of breath   . Anemia   . Iron deficiency anemia 05/26/2011  . Hyperlipidemia 08/30/2011  . Liver damage 1960's    Several liver bx due to elevated liver functions  . Renal insufficiency 02/21/2012   Past Surgical History  Procedure Date  . Abdominal aortic aneurysm repair   . Pr vein bypass graft,aorto-fem-pop   . Esophagogastroduodenoscopy 06/10/2011    Procedure: ESOPHAGOGASTRODUODENOSCOPY (EGD);  Surgeon: Petra Kuba, MD;  Location: [facility] ENDOSCOPY;  Service: Endoscopy;  Laterality: N/A;   Social History:  reports that he has been smoking Cigarettes.  He has a 12.5 pack-year smoking history. He has never used smokeless tobacco. He reports that he does not drink alcohol or use illicit drugs. Lives at home with wife can perform all his ADLs  Allergies  Allergen Reactions  . Levofloxacin Other (See Comments)    HEMOLYTIC ANEMIA    Family History  Problem Relation Age of Onset  . Cancer Mother     breast  . Hypertension Father   . Diabetes Sister   . COPD Brother     Prior to Admission medications   Medication Sig Start Date End Date Taking? Authorizing Provider  albuterol (PROVENTIL HFA;VENTOLIN HFA) 108 (90 BASE) MCG/ACT inhaler Inhale 1-2 puffs into the lungs every 4 (four) hours as needed. Wheezing 01/26/12  Yes Lennis Buzzy Han, MD  amLODipine (NORVASC) 5 MG tablet Take 5 mg by mouth daily.   Yes Historical Provider, MD  atorvastatin (LIPITOR) 10 MG tablet Take 10 mg by mouth daily.  Yes Historical Provider, MD  ferrous fumarate (HEMOCYTE - 106 MG FE) 325 (106 FE) MG TABS Take 1 tablet by mouth.   Yes Historical Provider, MD  folic acid (FOLVITE) 1 MG tablet Take 1 mg by mouth daily.   Yes Historical Provider, MD  methimazole (TAPAZOLE) 5 MG tablet Take  5 mg by mouth daily.   Yes Historical Provider, MD  pantoprazole (PROTONIX) 40 MG tablet Take 40 mg by mouth daily.   Yes Historical Provider, MD   Physical Exam: Filed Vitals:   04/29/12 1045 04/29/12 1115 04/29/12 1130 04/29/12 1202  BP: 116/77 132/77 156/106 153/86  Pulse: 80   121  Temp:    100.5 F (38.1 C)  TempSrc:    Oral  Resp: 19 30 30    SpO2: 98%   96%     General:  Awake alert and oriented x3 in no acute distress nontoxic appearing  Eyes: Anicteric pupils equally round and reactive to light  ENT: Dry mucous membranes  Neck: No JVD  Cardiovascular: Regular rate and rhythm with a positive S1 and S2 no murmurs rubs or gallops appreciated  Respiratory: Moderate air movement but clear to auscultation.  Abdomen: Positive bowel sounds nontender nondistended soft  Skin: No rashes or ulcerations  Musculoskeletal: No deformity  Psychiatric: Appropriate  Neurologic: Awake alert and oriented x3 coherent for language and nonfocal  Labs on Admission:  Basic Metabolic Panel:  Lab 04/29/12 3664 04/28/12 1143  NA 135 138  K 3.6 3.7  CL 101 106  CO2 22 24  GLUCOSE 118* 139*  BUN 26* 14.0  CREATININE 2.27* 1.5*  CALCIUM 8.7 9.1  MG -- --  PHOS -- --   Liver Function Tests:  Lab 04/29/12 0947 04/28/12 1143  AST 15 10  ALT <5 <6  ALKPHOS 82 101  BILITOT 0.8 0.81  PROT 6.7 7.5  ALBUMIN 2.9* 3.4*   No results found for this basename: LIPASE:5,AMYLASE:5 in the last 168 hours No results found for this basename: AMMONIA:5 in the last 168 hours CBC:  Lab 04/29/12 0947 04/28/12 1143  WBC 18.0* 14.2*  NEUTROABS 15.8* 12.2*  HGB 9.1* 10.3*  HCT 26.2* 30.6*  MCV 81.9 84.1  PLT 63* 70*   Cardiac Enzymes: No results found for this basename: CKTOTAL:5,CKMB:5,CKMBINDEX:5,TROPONINI:5 in the last 168 hours  BNP (last 3 results)  Basename 06/08/11 1202  PROBNP 2717.0*   CBG: No results found for this basename: GLUCAP:5 in the last 168 hours  Radiological  Exams on Admission: Dg Abd Acute W/chest  04/29/2012  *RADIOLOGY REPORT*  Clinical Data: Gastrointestinal bleeding  ACUTE ABDOMEN SERIES (ABDOMEN 2 VIEW & CHEST 1 VIEW)  Comparison: 06/10/2011  Findings: Normal heart size.  No pleural effusion or edema. Increased lung volumes and coarsened interstitial markings are noted suggestive of COPD.  No airspace consolidation.  IVC filter is identified.  There are surgical clips within the left upper quadrant of the abdomen.  Nonobstructive bowel gas pattern.  Gas and stool noted within the colon up to the rectum.  There is a scoliosis deformity of involving the thoracic and lumbar spine.  IMPRESSION:  1.  No acute cardiopulmonary abnormalities.  Suspect COPD. 2.  Nonobstructive bowel gas pattern.   Original Report Authenticated By: Signa Kell, M.D.     EKG: Independently reviewed. Pending  Assessment/Plan Fever/sepsis with hypotension: -His urine does appear to be compatible with urinary tract infection, he does have a leukocytosis and fever. He appears nontoxic, he relates his urine is malodorous.  This most likely cause for his fever will be a urinary tract infection. I agree with the ED physician start him on Rocephin empirically, and cultures have already been drawn. We'll also get blood cultures x2. We'll check orthostatics and will continue IV fluids at 125. -His lactic acid was checked which was within normal limits. I would determine other bolus of 500 cc. Check orthostatics after admission.  Acute on Chronic kidney disease (CKD), stage III (moderate) -Most likely likely secondary to hypotension and sepsis. He has responded to IV fluids. We'll go ahead and give him another liter of IV fluids. We'll check a basic metabolic panel in the morning. His baseline creatinine is 1.5. -Elbow head and hold all nephrotoxic drugs.   COPD (chronic obstructive pulmonary disease): -Seems to be stable he is saturating well in room air, he is moving moderate air  but clear to auscultation. Continue home meds.  Iron deficiency anemia: -He does have a history of hemolytic anemia, check a haptoglobin, total bilirubin, reticulocyte count and Coombs test, we'll also check a peripheral smear, his platelet seems to be stable at 70 but as he turned him over the past year they have been slowly trending down. This could be all secondary to his recent biopsy he had. -Also talked to screen him. As he seems intravascularly depleted. He is complaining of orthostatic hypotension. Some shortness hemoglobin will drop after hydration. -Check CBC tonight.  Leukocytosis: -See fevers and sepsis.  HTN (hypertension) -We'll hold all his blood pressure medications. 2 to monitor vitals.  Hyperthyroidism: -Continue methimazole check a TSH.  Code Status: Full (must indicate code status--if unknown or must be presumed, indicate so) Family Communication: Wife Disposition Plan: Home (indicate anticipated LOS)  Time spent: 60 minutes  FELIZ Rosine Beat [facility] Pager [phone]  If 7PM-7AM, please contact night-coverage [email or web address] Password [email or web address] 04/29/2012, 12:24 PM

## 2012-04-29 NOTE — ED Provider Notes (Addendum)
History     CSN: [id number]  Arrival date & time 04/29/12  4098   First MD Initiated Contact with Patient 04/29/12 [phone]      Chief Complaint  Patient presents with  . GI Bleeding    (Consider location/radiation/quality/duration/timing/severity/associated sxs/prior treatment) The history is provided by the patient.  [name] is a 77 y.o. male history of AAA, COPD, anemia here presenting with melena and hematuria. He had colonoscopy 2 weeks ago and had 5 polyps removed. It was a large polyp that was not able to be removed. Since last night he had 2-3 episodes of black stools. Denies abdominal pain. He also has some painless hematuria. He also feels full lightheaded when he stands up but denies any chest pain or shortness of breath. Denies any fevers or chills but he has some nonproductive cough.   Past Medical History  Diagnosis Date  . AAA (abdominal aortic aneurysm)   . Thrombocytopenia   . Abdominal aneurysm without mention of rupture 04/23/2011  . Peripheral arterial disease 04/23/2011  . COPD (chronic obstructive pulmonary disease) 05/06/2011  . Shortness of breath   . Anemia   . Iron deficiency anemia 05/26/2011  . Hyperlipidemia 08/30/2011  . Liver damage 1960's    Several liver bx due to elevated liver functions  . Renal insufficiency 02/21/2012    Past Surgical History  Procedure Date  . Abdominal aortic aneurysm repair   . Pr vein bypass graft,aorto-fem-pop   . Esophagogastroduodenoscopy 06/10/2011    Procedure: ESOPHAGOGASTRODUODENOSCOPY (EGD);  Surgeon: Petra Kuba, MD;  Location: [facility] ENDOSCOPY;  Service: Endoscopy;  Laterality: N/A;    Family History  Problem Relation Age of Onset  . Cancer Mother     breast  . Hypertension Father   . Diabetes Sister   . COPD Brother     History  Substance Use Topics  . Smoking status: Current Every Day Smoker -- 0.2 packs/day for 50 years    Types: Cigarettes    Last Attempt to Quit: 03/30/2011  . Smokeless tobacco:  Never Used  . Alcohol Use: No     Quit 20 years ago. Was a heavy drinker in his 20s-30s      Review of Systems  Respiratory: Positive for cough.   Gastrointestinal: Positive for blood in stool.  All other systems reviewed and are negative.    Allergies  Levofloxacin  Home Medications   Current Outpatient Rx  Name Route Sig Dispense Refill  . ALBUTEROL SULFATE HFA 108 (90 BASE) MCG/ACT IN AERS Inhalation Inhale 1-2 puffs into the lungs every 4 (four) hours as needed. Wheezing    . AMLODIPINE BESYLATE 5 MG PO TABS Oral Take 5 mg by mouth daily.    . ATORVASTATIN CALCIUM 10 MG PO TABS Oral Take 10 mg by mouth daily.    Marland Kitchen FERROUS FUMARATE 325 (106 FE) MG PO TABS Oral Take 1 tablet by mouth.    . FOLIC ACID 1 MG PO TABS Oral Take 1 mg by mouth daily.    Marland Kitchen METHIMAZOLE 5 MG PO TABS Oral Take 5 mg by mouth daily.    Marland Kitchen PANTOPRAZOLE SODIUM 40 MG PO TBEC Oral Take 40 mg by mouth daily.      BP 153/86  Pulse 121  Temp 100.5 F (38.1 C) (Oral)  Resp 30  SpO2 96%  Physical Exam  Nursing note and vitals reviewed. Constitutional: He is oriented to person, place, and time. He appears well-developed and well-nourished.  HENT:  Head: Normocephalic.  Mouth/Throat: Oropharynx is clear and moist.  Eyes: Conjunctivae normal and EOM are normal. Pupils are equal, round, and reactive to light.  Neck: Normal range of motion. Neck supple.  Cardiovascular: Normal heart sounds.        Tachycardic   Pulmonary/Chest: Effort normal and breath sounds normal. No respiratory distress. He has no wheezes. He has no rales.  Abdominal: Soft. Bowel sounds are normal. He exhibits no distension. There is no tenderness. There is no rebound.       Rectal- brown stool  Musculoskeletal: Normal range of motion. He exhibits no edema.  Neurological: He is alert and oriented to person, place, and time.  Skin: Skin is warm and dry.  Psychiatric: He has a normal mood and affect. His behavior is normal. Judgment and  thought content normal.    ED Course  Procedures (including critical care time)  CRITICAL CARE Performed by: Silverio Lay, DAVID   Total critical care time: 30 minutes  Critical care time was exclusive of separately billable procedures and treating other patients.  Critical care was necessary to treat or prevent imminent or life-threatening deterioration.  Critical care was time spent personally by me on the following activities: development of treatment plan with patient and/or surrogate as well as nursing, discussions with consultants, evaluation of patient's response to treatment, examination of patient, obtaining history from patient or surrogate, ordering and performing treatments and interventions, ordering and review of laboratory studies, ordering and review of radiographic studies, pulse oximetry and re-evaluation of patient's condition.   Labs Reviewed  CBC WITH DIFFERENTIAL - Abnormal; Notable for the following:    WBC 18.0 (*)     RBC 3.20 (*)     Hemoglobin 9.1 (*)     HCT 26.2 (*)     Platelets 63 (*)  PLATELET COUNT CONFIRMED BY SMEAR   Neutrophils Relative 88 (*)     Lymphocytes Relative 5 (*)     Neutro Abs 15.8 (*)     Monocytes Absolute 1.3 (*)     All other components within normal limits  COMPREHENSIVE METABOLIC PANEL - Abnormal; Notable for the following:    Glucose, Bld 118 (*)     BUN 26 (*)     Creatinine, Ser 2.27 (*)     Albumin 2.9 (*)     GFR calc non Af Amer 27 (*)     GFR calc Af Amer 31 (*)     All other components within normal limits  PROTIME-INR - Abnormal; Notable for the following:    Prothrombin Time 17.2 (*)     All other components within normal limits  URINALYSIS, ROUTINE W REFLEX MICROSCOPIC - Abnormal; Notable for the following:    Color, Urine RED (*)  BIOCHEMICALS MAY BE AFFECTED BY COLOR   APPearance CLOUDY (*)     Hgb urine dipstick LARGE (*)     Bilirubin Urine SMALL (*)     Ketones, ur TRACE (*)     Protein, ur 100 (*)      Leukocytes, UA SMALL (*)     All other components within normal limits  URINE MICROSCOPIC-ADD ON - Abnormal; Notable for the following:    Bacteria, UA MANY (*)     All other components within normal limits  TYPE AND SCREEN  OCCULT BLOOD, POC DEVICE  LACTIC ACID, PLASMA  URINE CULTURE   Dg Abd Acute W/chest  04/29/2012  *RADIOLOGY REPORT*  Clinical Data: Gastrointestinal bleeding  ACUTE ABDOMEN SERIES (ABDOMEN 2 VIEW & CHEST  1 VIEW)  Comparison: 06/10/2011  Findings: Normal heart size.  No pleural effusion or edema. Increased lung volumes and coarsened interstitial markings are noted suggestive of COPD.  No airspace consolidation.  IVC filter is identified.  There are surgical clips within the left upper quadrant of the abdomen.  Nonobstructive bowel gas pattern.  Gas and stool noted within the colon up to the rectum.  There is a scoliosis deformity of involving the thoracic and lumbar spine.  IMPRESSION:  1.  No acute cardiopulmonary abnormalities.  Suspect COPD. 2.  Nonobstructive bowel gas pattern.   Original Report Authenticated By: Signa Kell, M.D.      1. Pyelonephritis   2. Melena   3. Sepsis associated hypotension       MDM  [name] is a 77 y.o. male here with rectal bleed and painless hematuria. He is also hypotensive. Will consider infection vs anemia. Will get labs, CXR, lactate, UA. Will get orthostatics, give IVF and evaluate.   12:11 PM He is orthostatic. Hypotension resolved with IVF. He developed chills and Temp 100.59F in the ED. He is likely septic from UTI. Blood cultures drawn, CXR nl. He is given ceftriaxone for UTI/pyelonephritis. Will admit to tele under Dr. Radonna Ricker.        Richardean Canal, MD 04/29/12 1211  Richardean Canal, MD 04/29/12 [phone]

## 2012-04-29 NOTE — ED Notes (Signed)
Pt stated that his stool were tarry and his penis was red and that he was dizzy when getting up

## 2012-04-29 NOTE — Progress Notes (Signed)
Per Md, hold blood transfusion at this time.  Blood bank notified.

## 2012-04-29 NOTE — ED Notes (Addendum)
Stated had black tarry stool 2-3 times last night. Seeing Dr. Darrold Span yesterday for blood work. Had colonoscopy 2 weeks ago- had 5 polyps removed. No bleeding since then until yesterday

## 2012-04-30 DIAGNOSIS — D696 Thrombocytopenia, unspecified: Secondary | ICD-10-CM | POA: Diagnosis present

## 2012-04-30 DIAGNOSIS — I10 Essential (primary) hypertension: Secondary | ICD-10-CM

## 2012-04-30 DIAGNOSIS — D72829 Elevated white blood cell count, unspecified: Secondary | ICD-10-CM

## 2012-04-30 LAB — COMPREHENSIVE METABOLIC PANEL
ALT: 9 U/L (ref 0–53)
AST: 24 U/L (ref 0–37)
Calcium: 8.3 mg/dL — ABNORMAL LOW (ref 8.4–10.5)
Creatinine, Ser: 2 mg/dL — ABNORMAL HIGH (ref 0.50–1.35)
Sodium: 141 mEq/L (ref 135–145)
Total Protein: 5.6 g/dL — ABNORMAL LOW (ref 6.0–8.3)

## 2012-04-30 LAB — CBC
HCT: 24.8 % — ABNORMAL LOW (ref 39.0–52.0)
Hemoglobin: 8.6 g/dL — ABNORMAL LOW (ref 13.0–17.0)
MCH: 28.3 pg (ref 26.0–34.0)
MCHC: 34.7 g/dL (ref 30.0–36.0)
RBC: 3.04 MIL/uL — ABNORMAL LOW (ref 4.22–5.81)

## 2012-04-30 MED ORDER — SODIUM CHLORIDE 0.9 % IV BOLUS (SEPSIS)
1000.0000 mL | Freq: Once | INTRAVENOUS | Status: AC
  Administered 2012-04-30: 1000 mL via INTRAVENOUS

## 2012-04-30 MED ORDER — SODIUM CHLORIDE 0.9 % IV BOLUS (SEPSIS)
500.0000 mL | Freq: Once | INTRAVENOUS | Status: DC
Start: 2012-04-30 — End: 2012-04-30

## 2012-04-30 MED ORDER — DEXTROSE 5 % IV SOLN
1.0000 g | INTRAVENOUS | Status: DC
  Administered 2012-04-30 – 2012-05-02 (×3): 1 g via INTRAVENOUS
  Filled 2012-04-30 (×3): qty 10

## 2012-04-30 NOTE — Progress Notes (Signed)
Utilization review completed.  

## 2012-04-30 NOTE — Progress Notes (Signed)
[facility] PROGRESS NOTE  Assessment/Plan: Sepsis associated hypotension/Fever (04/29/2012): -resolved. Mild episode of hypotension overnight.  NS bolus, strict I&O's -rocephin 11.2.2013 -UC pending, BC x2 pending, patient deverferce. Leukocytosis improving. ->20% band on peripheral smear.  Acute renal failure/Chronic kidney disease (CKD), stage III (moderate)  -We'll go ahead and give him another liter of IV fluids and continue NS saline. -Creatinine trending down. His baseline creatinine is 1.5.  -Hold all nephrotoxic drugs.  Iron deficiency anemia: -haptoglobin 240, LDH 243, total bilirubin <1.0, reticulocyte count WNL. -peripheral smear 20% bands.   Thrombocytopenia: -this most likely 2/2 sepsis. -continue to monitor.  HTN (hypertension) (08/30/2011)  -continue to hold Bp meds.  Hyperthyroidism (09/26/2011) -tapazole.   COPD (chronic obstructive pulmonary disease) (05/06/2011) -Seems to be stable he is saturating well in room air, he is moving moderate air but clear to auscultation.  -Continue home meds.  Code Status: full Family Communication:  wife  Disposition Plan: home   Consultants:  none  Procedures:  none  Antibiotics: -Rocephin 11.2.2013  HPI/Subjective: No complains  Relates he feels better  Objective: Filed Vitals:   04/29/12 1612 04/29/12 1613 04/29/12 2222 04/30/12 0547  BP: 88/63 84/59 85/50  94/64  Pulse:   101 82  Temp:   97.7 F (36.5 C) 98.1 F (36.7 C)  TempSrc:   Oral Oral  Resp:   20 18  Height:      Weight:      SpO2:   97% 93%    Intake/Output Summary (Last 24 hours) at 04/30/12 1025 Last data filed at 04/30/12 0933  Gross per 24 hour  Intake   2713 ml  Output    500 ml  Net   2213 ml   Filed Weights   04/29/12 1500  Weight: 58.968 kg (130 lb)    Exam:  General: Alert, awake, oriented x3, in no acute distress.  HEENT: No bruits, no goiter.  Heart: Regular rate and rhythm, without murmurs, rubs, gallops.    Lungs: Good air movement, clear to auscultation Abdomen: Soft, nontender, nondistended, positive bowel sounds.  Neuro: Grossly intact, nonfocal.   Data Reviewed: Basic Metabolic Panel:  Lab 04/30/12 1610 04/29/12 0947 04/28/12 1143  NA 141 135 138  K 3.8 3.6 3.7  CL 110 101 106  CO2 22 22 24   GLUCOSE 103* 118* 139*  BUN 30* 26* 14.0  CREATININE 2.00* 2.27* 1.5*  CALCIUM 8.3* 8.7 9.1  MG -- -- --  PHOS -- -- --   Liver Function Tests:  Lab 04/30/12 0500 04/29/12 0947 04/28/12 1143  AST 24 15 10   ALT 9 <5 <6  ALKPHOS 76 82 101  BILITOT 0.3 0.8 0.81  PROT 5.6* 6.7 7.5  ALBUMIN 2.2* 2.9* 3.4*   No results found for this basename: LIPASE:5,AMYLASE:5 in the last 168 hours No results found for this basename: AMMONIA:5 in the last 168 hours CBC:  Lab 04/30/12 0500 04/29/12 1655 04/29/12 0947 04/28/12 1143  WBC 16.5* 16.4* 18.0* 14.2*  NEUTROABS -- -- 15.8* 12.2*  HGB 8.6* 8.2* 9.1* 10.3*  HCT 24.8* 23.4* 26.2* 30.6*  MCV 81.6 82.1 81.9 84.1  PLT 53* 57* 63* 70*   Cardiac Enzymes: No results found for this basename: CKTOTAL:5,CKMB:5,CKMBINDEX:5,TROPONINI:5 in the last 168 hours BNP (last 3 results)  Basename 06/08/11 1202  PROBNP 2717.0*   CBG: No results found for this basename: GLUCAP:5 in the last 168 hours  No results found for this or any previous visit (from the past 240 hour(s)).  Studies: Dg Abd Acute W/chest  04/29/2012  *RADIOLOGY REPORT*  Clinical Data: Gastrointestinal bleeding  ACUTE ABDOMEN SERIES (ABDOMEN 2 VIEW & CHEST 1 VIEW)  Comparison: 06/10/2011  Findings: Normal heart size.  No pleural effusion or edema. Increased lung volumes and coarsened interstitial markings are noted suggestive of COPD.  No airspace consolidation.  IVC filter is identified.  There are surgical clips within the left upper quadrant of the abdomen.  Nonobstructive bowel gas pattern.  Gas and stool noted within the colon up to the rectum.  There is a scoliosis deformity of  involving the thoracic and lumbar spine.  IMPRESSION:  1.  No acute cardiopulmonary abnormalities.  Suspect COPD. 2.  Nonobstructive bowel gas pattern.   Original Report Authenticated By: Signa Kell, M.D.     Scheduled Meds:   . [COMPLETED] acetaminophen      . [COMPLETED] cefTRIAXone (ROCEPHIN) IVPB 1 gram/50 mL D5W  1 g Intravenous Once  . ferrous fumarate  1 tablet Oral BID  . folic acid  1 mg Oral Daily  . [COMPLETED] influenza  inactive virus vaccine  0.5 mL Intramuscular Tomorrow-1000  . methimazole  5 mg Oral Daily  . pantoprazole  40 mg Oral Daily  . [COMPLETED] pneumococcal 23 valent vaccine  0.5 mL Intramuscular Tomorrow-1000  . [COMPLETED] sodium chloride  500 mL Intravenous Once  . sodium chloride  3 mL Intravenous Q12H  . [DISCONTINUED] sodium chloride   Intravenous STAT   Continuous Infusions:   . sodium chloride 150 mL/hr at 04/30/12 0929     Marinda Elk  [facility] Pager [phone]. If 8PM-8AM, please contact night-coverage at [email or web address], password [facility] 04/30/2012, 10:25 AM  LOS: 1 day

## 2012-05-01 ENCOUNTER — Telehealth: Payer: Self-pay

## 2012-05-01 LAB — CBC
HCT: 23.6 % — ABNORMAL LOW (ref 39.0–52.0)
MCH: 28.2 pg (ref 26.0–34.0)
MCV: 80.3 fL (ref 78.0–100.0)
Platelets: 52 10*3/uL — ABNORMAL LOW (ref 150–400)
RBC: 2.94 MIL/uL — ABNORMAL LOW (ref 4.22–5.81)
WBC: 11 10*3/uL — ABNORMAL HIGH (ref 4.0–10.5)

## 2012-05-01 LAB — BASIC METABOLIC PANEL
BUN: 28 mg/dL — ABNORMAL HIGH (ref 6–23)
CO2: 21 mEq/L (ref 19–32)
Calcium: 8.4 mg/dL (ref 8.4–10.5)
Chloride: 111 mEq/L (ref 96–112)
Creatinine, Ser: 1.59 mg/dL — ABNORMAL HIGH (ref 0.50–1.35)

## 2012-05-01 MED ORDER — AMLODIPINE BESYLATE 5 MG PO TABS
5.0000 mg | ORAL_TABLET | Freq: Every day | ORAL | Status: DC
  Administered 2012-05-01: 5 mg via ORAL
  Filled 2012-05-01 (×2): qty 1

## 2012-05-01 NOTE — Progress Notes (Signed)
[facility] PROGRESS NOTE  Assessment/Plan: Sepsis associated hypotension/Fever (04/29/2012): -resolved. Mild episode of hypotension overnight.  NS bolus, strict I&O's -rocephin 11.2.2013 -UC E. Coli awaiting sensitivities, BC x2 pending, patient deverferce. Leukocytosis improving. ->20% band on peripheral smear.  Acute renal failure/Chronic kidney disease (CKD), stage III (moderate)  -treated with IV fluid. CretininePeaked at 2.2 on 11.2.2013 -Creatinine trending down. His baseline creatinine is 1.5.  -Hold all nephrotoxic drugs.  Iron deficiency anemia: -haptoglobin 240, LDH 243, total bilirubin <1.0, reticulocyte count WNL. Not compatible with hemolysis -peripheral smear 20% bands.   Thrombocytopenia: -this most likely 2/2 sepsis. -continue to monitor.  HTN (hypertension) (08/30/2011)  -continue to hold Bp meds.  Hyperthyroidism (09/26/2011) -tapazole.   COPD (chronic obstructive pulmonary disease) (05/06/2011) -Seems to be stable he is saturating well in room air, he is moving moderate air but clear to auscultation.  -Continue home meds.  Code Status: full Family Communication:  wife  Disposition Plan: home   Consultants:  none  Procedures:  none  Antibiotics: -Rocephin 11.2.2013  HPI/Subjective: No complains  Relates he feels better  Objective: Filed Vitals:   04/29/12 2222 04/30/12 0547 04/30/12 1347 04/30/12 2230  BP: 85/50 94/64 151/85 126/79  Pulse: 101 82 58 55  Temp: 97.7 F (36.5 C) 98.1 F (36.7 C) 97.7 F (36.5 C) 98.4 F (36.9 C)  TempSrc: Oral Oral Oral Oral  Resp: 20 18 16 20   Height:      Weight:      SpO2: 97% 93% 100% 98%    Intake/Output Summary (Last 24 hours) at 05/01/12 0824 Last data filed at 05/01/12 0210  Gross per 24 hour  Intake 2412.5 ml  Output   1000 ml  Net 1412.5 ml   Filed Weights   04/29/12 1500  Weight: 58.968 kg (130 lb)    Exam:  General: Alert, awake, oriented x3, in no acute distress.    HEENT: No bruits, no goiter.  Heart: Regular rate and rhythm, without murmurs, rubs, gallops.  Lungs: Good air movement, clear to auscultation Abdomen: Soft, nontender, nondistended, positive bowel sounds.  Neuro: Grossly intact, nonfocal.   Data Reviewed: Basic Metabolic Panel:  Lab 05/01/12 1610 04/30/12 0500 04/29/12 0947 04/28/12 1143  NA 140 141 135 138  K 3.8 3.8 3.6 3.7  CL 111 110 101 106  CO2 21 22 22 24   GLUCOSE 90 103* 118* 139*  BUN 28* 30* 26* 14.0  CREATININE 1.59* 2.00* 2.27* 1.5*  CALCIUM 8.4 8.3* 8.7 9.1  MG -- -- -- --  PHOS -- -- -- --   Liver Function Tests:  Lab 04/30/12 0500 04/29/12 0947 04/28/12 1143  AST 24 15 10   ALT 9 <5 <6  ALKPHOS 76 82 101  BILITOT 0.3 0.8 0.81  PROT 5.6* 6.7 7.5  ALBUMIN 2.2* 2.9* 3.4*   No results found for this basename: LIPASE:5,AMYLASE:5 in the last 168 hours No results found for this basename: AMMONIA:5 in the last 168 hours CBC:  Lab 05/01/12 0453 04/30/12 0500 04/29/12 1655 04/29/12 0947 04/28/12 1143  WBC 11.0* 16.5* 16.4* 18.0* 14.2*  NEUTROABS -- -- -- 15.8* 12.2*  HGB 8.3* 8.6* 8.2* 9.1* 10.3*  HCT 23.6* 24.8* 23.4* 26.2* 30.6*  MCV 80.3 81.6 82.1 81.9 84.1  PLT 52* 53* 57* 63* 70*   Cardiac Enzymes: No results found for this basename: CKTOTAL:5,CKMB:5,CKMBINDEX:5,TROPONINI:5 in the last 168 hours BNP (last 3 results)  Basename 06/08/11 1202  PROBNP 2717.0*   CBG: No results found for this basename: GLUCAP:5  in the last 168 hours  Recent Results (from the past 240 hour(s))  URINE CULTURE     Status: Normal (Preliminary result)   Collection Time   04/29/12 10:32 AM      Component Value Range Status Comment   Specimen Description URINE, CLEAN CATCH   Final    Special Requests NONE   Final    Culture  Setup Time 04/29/2012 20:20   Final    Colony Count >=100,000 COLONIES/ML   Final    Culture ESCHERICHIA COLI   Final    Report Status PENDING   Incomplete   CULTURE, BLOOD (ROUTINE X 2)     Status:  Normal (Preliminary result)   Collection Time   04/29/12  4:35 PM      Component Value Range Status Comment   Specimen Description BLOOD LEFT HAND   Final    Special Requests BOTTLES DRAWN AEROBIC AND ANAEROBIC 5CC EACH   Final    Culture  Setup Time 04/29/2012 21:14   Final    Culture     Final    Value:        BLOOD CULTURE RECEIVED NO GROWTH TO DATE CULTURE WILL BE HELD FOR 5 DAYS BEFORE ISSUING A FINAL NEGATIVE REPORT   Report Status PENDING   Incomplete   CULTURE, BLOOD (ROUTINE X 2)     Status: Normal (Preliminary result)   Collection Time   04/29/12  4:56 PM      Component Value Range Status Comment   Specimen Description BLOOD RIGHT ARM   Final    Special Requests BOTTLES DRAWN AEROBIC AND ANAEROBIC 5CC EACH   Final    Culture  Setup Time 04/29/2012 21:14   Final    Culture     Final    Value:        BLOOD CULTURE RECEIVED NO GROWTH TO DATE CULTURE WILL BE HELD FOR 5 DAYS BEFORE ISSUING A FINAL NEGATIVE REPORT   Report Status PENDING   Incomplete      Studies: Dg Abd Acute W/chest  04/29/2012  *RADIOLOGY REPORT*  Clinical Data: Gastrointestinal bleeding  ACUTE ABDOMEN SERIES (ABDOMEN 2 VIEW & CHEST 1 VIEW)  Comparison: 06/10/2011  Findings: Normal heart size.  No pleural effusion or edema. Increased lung volumes and coarsened interstitial markings are noted suggestive of COPD.  No airspace consolidation.  IVC filter is identified.  There are surgical clips within the left upper quadrant of the abdomen.  Nonobstructive bowel gas pattern.  Gas and stool noted within the colon up to the rectum.  There is a scoliosis deformity of involving the thoracic and lumbar spine.  IMPRESSION:  1.  No acute cardiopulmonary abnormalities.  Suspect COPD. 2.  Nonobstructive bowel gas pattern.   Original Report Authenticated By: Signa Kell, M.D.     Scheduled Meds:    . cefTRIAXone (ROCEPHIN)  IV  1 g Intravenous Q24H  . ferrous fumarate  1 tablet Oral BID  . folic acid  1 mg Oral Daily  .  [COMPLETED] influenza  inactive virus vaccine  0.5 mL Intramuscular Tomorrow-1000  . methimazole  5 mg Oral Daily  . pantoprazole  40 mg Oral Daily  . [COMPLETED] pneumococcal 23 valent vaccine  0.5 mL Intramuscular Tomorrow-1000  . [COMPLETED] sodium chloride  1,000 mL Intravenous Once  . sodium chloride  3 mL Intravenous Q12H  . [DISCONTINUED] sodium chloride  500 mL Intravenous Once   Continuous Infusions:    . [EXPIRED] sodium chloride 150 mL/hr  at 04/30/12 1500     Marinda Elk  [facility] Pager [phone]. If 8PM-8AM, please contact night-coverage at [email or web address], password [facility] 05/01/2012, 8:24 AM  LOS: 2 days

## 2012-05-01 NOTE — Telephone Encounter (Signed)
Left a message for Mr. Thew that is iron level is still quite low per Dr. Darrold Span.   Dr.[name] would like to give him IV iron in the next 1-2 weeks if he is agreeable to it.  Requested that Mr. Wende Bushy call back to discuss the plan or let us know that he is fine with setting up infusion appt. Dr. Darrold Span needs a repeat cbc with the iv iron if done, as Mr. Molstad' plts were lower last week.

## 2012-05-02 DIAGNOSIS — I498 Other specified cardiac arrhythmias: Secondary | ICD-10-CM

## 2012-05-02 DIAGNOSIS — I714 Abdominal aortic aneurysm, without rupture: Secondary | ICD-10-CM

## 2012-05-02 LAB — BASIC METABOLIC PANEL
BUN: 24 mg/dL — ABNORMAL HIGH (ref 6–23)
CO2: 24 mEq/L (ref 19–32)
Calcium: 8.5 mg/dL (ref 8.4–10.5)
Creatinine, Ser: 1.59 mg/dL — ABNORMAL HIGH (ref 0.50–1.35)
GFR calc non Af Amer: 41 mL/min — ABNORMAL LOW (ref >90)
Glucose, Bld: 97 mg/dL (ref 70–99)
Sodium: 136 mEq/L (ref 135–145)

## 2012-05-02 LAB — CBC
MCH: 27.7 pg (ref 26.0–34.0)
MCHC: 34.1 g/dL (ref 30.0–36.0)
MCV: 81.1 fL (ref 78.0–100.0)
Platelets: 70 10*3/uL — ABNORMAL LOW (ref 150–400)
RDW: 15.4 % (ref 11.5–15.5)

## 2012-05-02 LAB — URINE CULTURE

## 2012-05-02 MED ORDER — CEPHALEXIN 500 MG PO CAPS: 500.0000 mg | ORAL_CAPSULE | Freq: Two times a day (BID) | ORAL | Status: DC

## 2012-05-02 NOTE — Telephone Encounter (Signed)
Mrs. Venable called to  Inform Dr. Darrold Span that [name] was admitted 04-29-12.  He is in [facility] room 1433. Will give this information to Dr. Darrold Span.

## 2012-05-02 NOTE — Discharge Summary (Signed)
Physician Discharge Summary  [name] NWG:[id number] DOB: Mar 25, 1935 DOA: 04/29/2012  PCP: Oliver Barre, MD  Admit date: 04/29/2012 Discharge date: 05/02/2012  Recommendations for Outpatient Follow-up:  1. Follow up with Dr. Darrold Span within 1-2 weeks for IV iron infusion 2. Repeat CBC in 1-2 weeks by PCP or Dr. Darrold Span to verify platelets continue to recover 3. Repeat urine culture after antibiotics complete to ensure infection has cleared 4. PCP to follow up final results of blood culture at follow up appointment in 2 days  Discharge Diagnoses:  Principal Problem:  *Sepsis associated hypotension Active Problems:  COPD (chronic obstructive pulmonary disease)  Iron deficiency anemia  Leukocytosis  HTN (hypertension)  Hyperthyroidism  Chronic kidney disease (CKD), stage III (moderate)  Fever  Acute renal failure  Thrombocytopenia   Discharge Condition: stable, improved  Diet recommendation: healthy heart  Wt Readings from Last 3 Encounters:  04/29/12 58.968 kg (130 lb)  04/28/12 59.33 kg (130 lb 12.8 oz)  04/11/12 58.287 kg (128 lb 8 oz)    History of present illness:   [name] is a 77 y.o. 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 Darrold Span, also past medical history of AAA status post repair,aortobifem bypass 03-31-2011, iron deficiency anemia, he also had an unusual bleeding from the bone marrow biopsy, past medical history of PE in 2012 status post IVC filter, with a colonoscopy in 04/06/2012.by Dr. Dulce Sellar 2 polyps were removed at that time,chronic renal disease with baseline creatinine 1.5. They comes in for fever chills and hematuria 1 day prior to admission. He relates his urine is malodorous, he also relates that he has not checked his temperature at home but he feels a little bit hot. His temperature here in the emergency room  was 100.5. He relates no recent urological procedures. She also relates some anorexia. Denies any chest pain shortness of breath nausea no vomiting no diarrhea. Here in the ED he was found to be hypotensive with a lactic acid of 2.1 his blood pressure responded normal saline. Today's creatinine is 2.2. So we were asked to admit and further evaluate.   Hospital Course:   Sepsis associated hypotension and fever (04/29/2012):  Likely due to urinary tract infection due to pansensitive E. Coli.  He was hypotensive requiring IVF but not vasopressors initially and his blood presssure medications were held.  He continued to have intermittent hypotension for 2 days, but over the 24 hours prior to discharge, his blood pressures were stable to elevated.  His blood cultures remain no growth to date, but have several days left before they are final.   He was started on ceftriaxone on 11/2 and received 4 doses in the hospital.  Fluoroquinolones and bactrim should be avoided in this gentleman with history of hemolytic anemia and current thrombocytopenia, so he will be discharged on keflex to take for 6 more days to complete a 10-day course.     Acute renal failure/Chronic kidney disease (CKD), stage III (moderate).  Creatinine peaked at 2.27 in 11/2, but trended down to 1.59 with baseline near 1.5 with IVF.    Iron deficiency anemia:  Haptoglobin 240, LDH 243, total bilirubin <1.0, reticulocyte count WNL. Not compatible with hemolysis.  Patient should follow up with Dr. Darrold Span in 1-2 weeks for IV iron infusion.    Thrombocytopenia was likely due to sepsis and platelets nadired on 11/4 at 52K  and trended up on 11/5.  He should have a repeat CBC in 1 to 2 weeks to verify that this is continuing to improve.    HTN (hypertension) (08/30/2011).  Blood pressure medications were initially held for hypotension, but he may resume them as an outpatient started on 11/6.  Hyperthyroidism (09/26/2011)  Stable.  Continued tapazole.     COPD (chronic obstructive pulmonary disease) (05/06/2011).  Stable.  He continued his home meds.   Procedures:  KUB 11/2  Consultations:  None  Discharge Exam: Filed Vitals:   05/02/12 0516  BP: 128/69  Pulse: 59  Temp: 98.4 F (36.9 C)  Resp: 18   Filed Vitals:   05/01/12 0953 05/01/12 1411 05/01/12 2156 05/02/12 0516  BP: 150/81 135/83 138/73 128/69  Pulse:  58 60 59  Temp:  98.8 F (37.1 C) 98.3 F (36.8 C) 98.4 F (36.9 C)  TempSrc:  Oral Oral Oral  Resp:  18 16 18   Height:      Weight:      SpO2:  100% 100% 97%    General: Thin AAM, no acute distress, lying on bed HEENT:  MMM Cardiovascular: RRR, no mrg, 2+ pulses Respiratory: CTAB ABD:  NABS, soft, nontender, nondistended, no organomegaly.   MSK:  No LEE Neuro:  Grossly intact  Discharge Instructions      Discharge Orders    Future Appointments: Provider: Department: Dept Phone: Center:   05/04/2012 10:45 AM Corwin Levins, MD [facility] [phone] [facility]   05/30/2012 11:30 AM Dava Najjar [facility] MED[facility]COL[facility]Y [phone] None   05/30/2012 12:00 PM Lennis Buzzy Han, MD [facility] [phone] None     Future Orders Please Complete By Expires   Diet - low sodium heart healthy      Increase activity slowly      Discharge instructions      Comments:   You were hospitalized with urinary tract infection and sepsis due to E. Coli.  You were given ceftriaxone and your blood pressure improved and your white blood cell count, a marker of infection, trended down.  Because of your medication allergy to levofloxacin and because of your history of hemolytic anemia and current thrombocytopenia,  please take keflex at home for completion of your course of antibiotics.  You may resume your home blood pressure medications.  Your primary care doctor should repeat a urine culture after you finish your antibiotics.  Please have your  primary care doctor or your hematologist repeat a CBC or blood count to make sure your blood counts are normalizing.  Your hematologist would also like to give you an iron infusion in the next two weeks.  Please schedule an appointment.   Call MD for:      Comments:   Call 911 if you have chest pain, shortness of breath, numbness or weakness of an arm or leg, slurred speech, confusion, or facial droop.   Call MD for:  temperature >100.4      Call MD for:  persistant nausea and vomiting      Call MD for:  severe uncontrolled pain      Call MD for:  difficulty breathing, headache or visual disturbances      Call MD for:  hives      Call MD for:  persistant dizziness or light-headedness      Call MD for:  extreme fatigue          Medication  List     As of 05/02/2012  2:53 PM    TAKE these medications         albuterol 108 (90 BASE) MCG/ACT inhaler   Commonly known as: PROVENTIL HFA;VENTOLIN HFA   Inhale 1-2 puffs into the lungs every 4 (four) hours as needed. Wheezing      amLODipine 5 MG tablet   Commonly known as: NORVASC   Take 5 mg by mouth daily.      atorvastatin 10 MG tablet   Commonly known as: LIPITOR   Take 10 mg by mouth daily.      cephALEXin 500 MG capsule   Commonly known as: KEFLEX   Take 1 capsule (500 mg total) by mouth 2 (two) times daily.      ferrous fumarate 325 (106 FE) MG Tabs   Commonly known as: HEMOCYTE - 106 mg FE   Take 1 tablet by mouth.      folic acid 1 MG tablet   Commonly known as: FOLVITE   Take 1 mg by mouth daily.      methimazole 5 MG tablet   Commonly known as: TAPAZOLE   Take 5 mg by mouth daily.      pantoprazole 40 MG tablet   Commonly known as: PROTONIX   Take 40 mg by mouth daily.        Follow-up Information    Follow up with [name],LENNIS P, MD. Schedule an appointment as soon as possible for a visit in 2 weeks. (IV iron infusion)    Contact information:   [address] [place] [address] [phone]        Follow up with Oliver Barre, MD. In 2 days. (at already scheduled appointment)    Contact information:   [address] [place] [address] [phone]           The results of significant diagnostics from this hospitalization (including imaging, microbiology, ancillary and laboratory) are listed below for reference.    Significant Diagnostic Studies: Dg Abd Acute W/chest  04/29/2012  *RADIOLOGY REPORT*  Clinical Data: Gastrointestinal bleeding  ACUTE ABDOMEN SERIES (ABDOMEN 2 VIEW & CHEST 1 VIEW)  Comparison: 06/10/2011  Findings: Normal heart size.  No pleural effusion or edema. Increased lung volumes and coarsened interstitial markings are noted suggestive of COPD.  No airspace consolidation.  IVC filter is identified.  There are surgical clips within the left upper quadrant of the abdomen.  Nonobstructive bowel gas pattern.  Gas and stool noted within the colon up to the rectum.  There is a scoliosis deformity of involving the thoracic and lumbar spine.  IMPRESSION:  1.  No acute cardiopulmonary abnormalities.  Suspect COPD. 2.  Nonobstructive bowel gas pattern.   Original Report Authenticated By: Signa Kell, M.D.     Microbiology: Recent Results (from the past 240 hour(s))  URINE CULTURE     Status: Normal   Collection Time   04/29/12 10:32 AM      Component Value Range Status Comment   Specimen Description URINE, CLEAN CATCH   Final    Special Requests NONE   Final    Culture  Setup Time 04/29/2012 20:20   Final    Colony Count >=100,000 COLONIES/ML   Final    Culture ESCHERICHIA COLI   Final    Report Status 05/02/2012 FINAL   Final    Organism ID, Bacteria ESCHERICHIA COLI   Final   CULTURE, BLOOD (ROUTINE X  2)     Status: Normal (Preliminary result)   Collection Time   04/29/12  4:35 PM      Component Value Range Status Comment   Specimen Description BLOOD LEFT HAND   Final    Special Requests BOTTLES DRAWN AEROBIC AND ANAEROBIC 5CC EACH    Final    Culture  Setup Time 04/29/2012 21:14   Final    Culture     Final    Value:        BLOOD CULTURE RECEIVED NO GROWTH TO DATE CULTURE WILL BE HELD FOR 5 DAYS BEFORE ISSUING A FINAL NEGATIVE REPORT   Report Status PENDING   Incomplete   CULTURE, BLOOD (ROUTINE X 2)     Status: Normal (Preliminary result)   Collection Time   04/29/12  4:56 PM      Component Value Range Status Comment   Specimen Description BLOOD RIGHT ARM   Final    Special Requests BOTTLES DRAWN AEROBIC AND ANAEROBIC 5CC EACH   Final    Culture  Setup Time 04/29/2012 21:14   Final    Culture     Final    Value:        BLOOD CULTURE RECEIVED NO GROWTH TO DATE CULTURE WILL BE HELD FOR 5 DAYS BEFORE ISSUING A FINAL NEGATIVE REPORT   Report Status PENDING   Incomplete      Labs: Basic Metabolic Panel:  Lab 05/02/12 1610 05/01/12 0453 04/30/12 0500 04/29/12 0947 04/28/12 1143  NA 136 140 141 135 138  K 3.6 3.8 3.8 3.6 3.7  CL 105 111 110 101 106  CO2 24 21 22 22 24   GLUCOSE 97 90 103* 118* 139*  BUN 24* 28* 30* 26* 14.0  CREATININE 1.59* 1.59* 2.00* 2.27* 1.5*  CALCIUM 8.5 8.4 8.3* 8.7 9.1  MG -- -- -- -- --  PHOS -- -- -- -- --   Liver Function Tests:  Lab 04/30/12 0500 04/29/12 0947 04/28/12 1143  AST 24 15 10   ALT 9 <5 <6  ALKPHOS 76 82 101  BILITOT 0.3 0.8 0.81  PROT 5.6* 6.7 7.5  ALBUMIN 2.2* 2.9* 3.4*   No results found for this basename: LIPASE:5,AMYLASE:5 in the last 168 hours No results found for this basename: AMMONIA:5 in the last 168 hours CBC:  Lab 05/02/12 0520 05/01/12 0453 04/30/12 0500 04/29/12 1655 04/29/12 0947  WBC 7.6 11.0* 16.5* 16.4* 18.0*  NEUTROABS -- -- -- -- 15.8*  HGB 8.5* 8.3* 8.6* 8.2* 9.1*  HCT 24.9* 23.6* 24.8* 23.4* 26.2*  MCV 81.1 80.3 81.6 82.1 81.9  PLT 70* 52* 53* 57* 63*   Cardiac Enzymes: No results found for this basename: CKTOTAL:5,CKMB:5,CKMBINDEX:5,TROPONINI:5 in the last 168 hours BNP: BNP (last 3 results)  Basename 06/08/11 1202  PROBNP 2717.0*     CBG: No results found for this basename: GLUCAP:5 in the last 168 hours  Time coordinating discharge: 45 minutes  Signed:  Channelle Bottger  [facility] 05/02/2012, 2:53 PM

## 2012-05-03 ENCOUNTER — Telehealth: Payer: Self-pay

## 2012-05-03 ENCOUNTER — Other Ambulatory Visit: Payer: Self-pay | Admitting: Oncology

## 2012-05-03 DIAGNOSIS — D696 Thrombocytopenia, unspecified: Secondary | ICD-10-CM

## 2012-05-03 NOTE — Telephone Encounter (Signed)
The doctor at the hospital told him to call Dr. Darrold Span to make an appt.  Told him this message will be given to  Dr. Darrold Span as she needs to review his records from his hospitalization to determine plan of care.

## 2012-05-03 NOTE — Progress Notes (Signed)
Patient hospitalized 04-29-12 thru 05-02-12 with E coli UTI, blood cultures negative to date but not final. Platelets dropped to 52 in hospital, were 70 at DC and Hgb 8.5.  He is already scheduled to PCP Dr John on 05-04-12, so I will have him see midlevel + CBC here on 05-12-12. Request to scheduler.  L.Winona Sison, MD 

## 2012-05-04 ENCOUNTER — Ambulatory Visit: Payer: Medicare Other | Admitting: Internal Medicine

## 2012-05-04 ENCOUNTER — Telehealth: Payer: Self-pay | Admitting: Oncology

## 2012-05-04 DIAGNOSIS — Z0289 Encounter for other administrative examinations: Secondary | ICD-10-CM

## 2012-05-04 NOTE — Telephone Encounter (Signed)
S/w pt re appt for 11/15. Per pof cx lb/AJ for 11/8 and schedule for 11/15.

## 2012-05-05 LAB — CULTURE, BLOOD (ROUTINE X 2): Culture: NO GROWTH

## 2012-05-05 LAB — HEMOSIDERIN, URINE

## 2012-05-12 ENCOUNTER — Ambulatory Visit: Payer: Medicare Other | Admitting: Physician Assistant

## 2012-05-12 ENCOUNTER — Ambulatory Visit (HOSPITAL_BASED_OUTPATIENT_CLINIC_OR_DEPARTMENT_OTHER): Payer: Medicare Other | Admitting: Lab

## 2012-05-12 ENCOUNTER — Encounter: Payer: Self-pay | Admitting: Physician Assistant

## 2012-05-12 ENCOUNTER — Ambulatory Visit (HOSPITAL_BASED_OUTPATIENT_CLINIC_OR_DEPARTMENT_OTHER): Payer: Medicare Other | Admitting: Physician Assistant

## 2012-05-12 VITALS — BP 166/87 | HR 50 | Temp 97.2°F | Resp 20 | Ht 71.0 in | Wt 130.0 lb

## 2012-05-12 DIAGNOSIS — D509 Iron deficiency anemia, unspecified: Secondary | ICD-10-CM

## 2012-05-12 DIAGNOSIS — D696 Thrombocytopenia, unspecified: Secondary | ICD-10-CM

## 2012-05-12 DIAGNOSIS — J449 Chronic obstructive pulmonary disease, unspecified: Secondary | ICD-10-CM

## 2012-05-12 DIAGNOSIS — D649 Anemia, unspecified: Secondary | ICD-10-CM

## 2012-05-12 DIAGNOSIS — Z86711 Personal history of pulmonary embolism: Secondary | ICD-10-CM

## 2012-05-12 LAB — CBC WITH DIFFERENTIAL/PLATELET
BASO%: 0.7 % (ref 0.0–2.0)
EOS%: 1.4 % (ref 0.0–7.0)
HCT: 27.5 % — ABNORMAL LOW (ref 38.4–49.9)
MCH: 29.1 pg (ref 27.2–33.4)
MCHC: 34.6 g/dL (ref 32.0–36.0)
MONO#: 0.6 10*3/uL (ref 0.1–0.9)
NEUT%: 59.2 % (ref 39.0–75.0)
RBC: 3.27 10*6/uL — ABNORMAL LOW (ref 4.20–5.82)
RDW: 16.2 % — ABNORMAL HIGH (ref 11.0–14.6)
WBC: 5.5 10*3/uL (ref 4.0–10.3)
lymph#: 1.6 10*3/uL (ref 0.9–3.3)

## 2012-05-12 NOTE — Patient Instructions (Addendum)
Follow up with Dr. Dulce Sellar 05/29/12 at 1:00 Follow up with Dr. Darrold Span as scheduled 05/30/12

## 2012-05-18 NOTE — Progress Notes (Signed)
OFFICE PROGRESS NOTE   05/12/2012  Physicians:B.Chen, J.John, S.Ellison, D.Grapey, W.Outlaw       INTERVAL HISTORY:  Patient is seen, alone for visit, in follow up of lower hemoglobin and platelets at visit 04-11-12, which seemed possibly related to colonoscopy with biopsy of large sessile polyp in transverse colon by Dr Dulce Sellar 04-06-12, with some blood per rectum on 04-10-12 according to patient. Patient reports that he was hospitalized recently. He had some blood in his urine as well as black stools while he was in the hospital. He is completing a course of Keflex for a urinary tract infection. He misunderstood the instructions for this antibiotic and has only been taking one tablet daily as opposed to one twice daily as was prescribed. He is 5 tablets remaining. He voices no specific complaints today. He's not had any fever or chills and does not report any further dysuria, hematuria or dark tarry stools.   Patient has had complicated course since presentation fall 2012 with unusual bleeding into RUE. He has been iron deficient, which was reason for the colonoscopy. 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 He had some microhematuria, or possibly hemoglobinuria, was seen by Dr Isabel Caprice, but may not have followed up with cystoscopy. He also had excessive bleeding from bone marrow site Aug 2013 (requiring 2 ED visits) and has had ASA-like platelet defect, tho not clear if he was off ASA at that testing. The bone marrow was slightly hypercellular with abundant megakaryocytes and increased iron without ringed sideroblasts, and normal cytogenetics.  Today he feels fine. He reports that he is eating well and resting well. He as stated above has had no further hematuria or dysuria. He has not noticed any further blood in his stools either. He denied any chest pain  or shortness of breath, denied cough or hemoptysis.  He denies diarrhea, bladder symptoms, sinus congestion or drainage, any pain. Appetite and energy are good. He is wearing long johns today and feels comfortable as it is a bit chilly outside today. Remainder of 10 point Review of Systems negative.  Patient tells me that he has resumed smoking, 1/3 - 1/2 ppd. We have discussed and I have strongly encouraged him to stop this entirely. Objective:  Vital signs in last 24 hours:  BP 166/87  Pulse 50  Temp 97.2 F (36.2 C) (Oral)  Resp 20  Ht 5\' 11"  (1.803 m)  Wt 130 lb (58.968 kg)  BMI 18.13 kg/m2 Alert, talkative, easily mobile, respirations not labored, no cough.  HEENT:PERRLA, sclera clear, anicteric and oropharynx clear, no lesions. No JVD LymphaticsCervical, supraclavicular, and axillary nodes normal. Resp: hyperresonant to percussion and somewhat diminished breath sounds bilaterally without wheezes or crackles Cardio: regular rate and rhythm GI: scaphoid, nontender, no HSM or mass, normal bowel sounds Extremities: extremities normal, atraumatic, no cyanosis or edema Neuro:unchanged/stable Skin without rash or ecchymosis, no petechiae   Lab Results:  Results for orders placed in visit on 05/12/12  CBC WITH DIFFERENTIAL      Component Value Range   WBC 5.5  4.0 - 10.3 10e3/uL   NEUT# 3.3  1.5 - 6.5 10e3/uL   HGB 9.5 (*) 13.0 - 17.1 g/dL   HCT 16.1 (*) 09.6 - 04.5 %   Platelets 165  140 - 400 10e3/uL   MCV 84.1  79.3 - 98.0 fL   MCH 29.1  27.2 - 33.4  pg   MCHC 34.6  32.0 - 36.0 g/dL   RBC 0.98 (*) 1.19 - 1.47 10e6/uL   RDW 16.2 (*) 11.0 - 14.6 %   lymph# 1.6  0.9 - 3.3 10e3/uL   MONO# 0.6  0.1 - 0.9 10e3/uL   Eosinophils Absolute 0.1  0.0 - 0.5 10e3/uL   Basophils Absolute 0.0  0.0 - 0.1 10e3/uL   NEUT% 59.2  39.0 - 75.0 %   LYMPH% 28.5  14.0 - 49.0 %   MONO% 10.2  0.0 - 14.0 %   EOS% 1.4  0.0 - 7.0 %   BASO% 0.7  0.0 - 2.0 %     Studies/Results:  No results  found.  Medications: I have reviewed the patient's current medications. The patient was advised to take his remaining Keflex 500 mg tablets twice daily as originally prescribed until completed.   Assessment/Plan: 1.Anemia: Hgb now up to 9.5  Not symptomatic. Still on oral iron. Does not appear to be hemolyzing now. Follow 2.Thrombocytopenia: Platelet count now 165,000 and stable. Patient exhibited a no bleeding or bruising.  3.low grade temperature - resolved  4.COPD, previous lower respiratory infections, ongoing tobacco: as above 5. Peripheral vascular disease: note off ASA due to bleeding problems 6.sessile, umbilicated colon polyp at recent colonoscopy: Dr Dulce Sellar to see in follow up as scheduled on 05/29/2012, may need to consider surgery 7.HTN, hx tachycardia 8.PE several weeks after vascular surgery 2012: IVC filter in, as he has not been candidate for anticoagulation 9.still needs flu shot  Patient seemed to follow discussion and was in agreement with plan  Patient discussed with Dr. Darrold Span. He will followup with Dr. Darrold Span as previously as scheduled on 05/30/2000 with repeat labs. Perhaps he can receive his flu shot at that time.  Laural Benes, Kailand Seda E,PA-C   05/18/2012, 10:27 PM

## 2012-05-18 NOTE — Progress Notes (Deleted)
Patient hospitalized 04-29-12 thru 05-02-12 with E coli UTI, blood cultures negative to date but not final. Platelets dropped to 52 in hospital, were 70 at DC and Hgb 8.5.  He is already scheduled to PCP Dr Jonny Ruiz on 05-04-12, so I will have him see midlevel + CBC here on 05-12-12. Request to scheduler.  Ila Mcgill, MD

## 2012-05-30 ENCOUNTER — Ambulatory Visit (HOSPITAL_BASED_OUTPATIENT_CLINIC_OR_DEPARTMENT_OTHER): Payer: Medicare Other | Admitting: Oncology

## 2012-05-30 ENCOUNTER — Other Ambulatory Visit (HOSPITAL_BASED_OUTPATIENT_CLINIC_OR_DEPARTMENT_OTHER): Payer: Medicare Other | Admitting: Lab

## 2012-05-30 ENCOUNTER — Encounter: Payer: Self-pay | Admitting: Oncology

## 2012-05-30 ENCOUNTER — Telehealth: Payer: Self-pay | Admitting: Oncology

## 2012-05-30 VITALS — BP 125/80 | HR 64 | Temp 97.1°F | Resp 18 | Wt 133.0 lb

## 2012-05-30 DIAGNOSIS — Z86711 Personal history of pulmonary embolism: Secondary | ICD-10-CM

## 2012-05-30 DIAGNOSIS — D649 Anemia, unspecified: Secondary | ICD-10-CM

## 2012-05-30 DIAGNOSIS — D696 Thrombocytopenia, unspecified: Secondary | ICD-10-CM

## 2012-05-30 DIAGNOSIS — F17200 Nicotine dependence, unspecified, uncomplicated: Secondary | ICD-10-CM

## 2012-05-30 LAB — CBC WITH DIFFERENTIAL/PLATELET
BASO%: 0.4 % (ref 0.0–2.0)
Basophils Absolute: 0 10*3/uL (ref 0.0–0.1)
EOS%: 1.8 % (ref 0.0–7.0)
HGB: 10.8 g/dL — ABNORMAL LOW (ref 13.0–17.1)
MCH: 28.3 pg (ref 27.2–33.4)
MONO#: 0.7 10*3/uL (ref 0.1–0.9)
RDW: 17.8 % — ABNORMAL HIGH (ref 11.0–14.6)
WBC: 6.1 10*3/uL (ref 4.0–10.3)
lymph#: 1.9 10*3/uL (ref 0.9–3.3)

## 2012-05-30 LAB — MORPHOLOGY

## 2012-05-30 NOTE — Patient Instructions (Signed)
Call if any bleeding or abnormal bruising

## 2012-05-30 NOTE — Progress Notes (Signed)
OFFICE PROGRESS NOTE   05/30/2012   Physicians:B.Chen, J.John, S.Ellison, D.Grapey, W.Outlaw   INTERVAL HISTORY:  Patient is seen.alone for visit, in continuing attention to his hematologic issues, particularly in light of apparent GI bleeding after recent colonoscopy and large sessile polyp still known present in transverse colon. 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 Mr Clinkscale is now scheduled for colonoscopy for attempt at removal of the large polyp by Dr Dulce Sellar on 07-20-11 at [facility]. Because of his thrombocytopenia and apparent platelet dysfunction with bleeding episodes previously, I have recommended that he be transfused platelets prior to procedure and also that these be available if needed during and after the procedure.   Patient has had complicated course since presentation fall 2012 with unusual bleeding into RUE. He has been iron deficient, which was reason for the colonoscopy. 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 He had some microhematuria, or possibly hemoglobinuria, was seen by Dr Isabel Caprice, but may not have followed up with cystoscopy. He also had excessive bleeding from bone marrow site Aug 2013 (requiring 2 ED visits) and has had ASA-like platelet defect, tho not clear if he was off ASA at that testing. Bone marrow was slightly hypercellular with abundant megakaryocytes and increased iron without ringed sideroblasts, and normal cytogenetics. Von Willebrands and factor VIII testing were normal.  Mr Runkles has felt well since he was hospitalized in Nov for hypotension associated with E coli UTI. He denies any fever, urinary symptoms, overt bleeding, other symptoms of infection, increased shortness of breath,  cough or any new or different pain. He has resumed smoking "less than before" and I have strongly encouraged him to stop this entirely, as he had done around time of the vascular surgery. Remainder of 10 point Review of Systems negative.  Objective:  Vital signs in last 24 hours:  BP 125/80  Pulse 64  Temp 97.1 F (36.2 C)  Resp 18  Wt 133 lb (60.328 kg) Weight is up 3 lbs. Easily ambulatory, looks comfortable, respirations not labored, no cough.   HEENT:PERRLA, sclera clear, anicteric and oropharynx clear, no lesions LymphaticsCervical, supraclavicular, and axillary nodes normal. Resp: clear to auscultation bilaterally and normal percussion bilaterally Diminished breath sounds, hyperresonant. Cardio: regular rate and rhythm GI: soft, non-tender; bowel sounds normal; no masses,  no organomegaly Extremities: extremities normal, atraumatic, no cyanosis or edema Skin without rash, ecchymosis or petechiae Neuro nonfocal  Lab Results:  Results for orders placed in visit on 05/30/12  CBC WITH DIFFERENTIAL      Component Value Range   WBC 6.1  4.0 - 10.3 10e3/uL   NEUT# 3.3  1.5 - 6.5 10e3/uL   HGB 10.8 (*) 13.0 - 17.1 g/dL   HCT 40.9 (*) 81.1 - 91.4 %   Platelets 73 (*) 140 - 400 10e3/uL   MCV 83.6  79.3 - 98.0 fL   MCH 28.3  27.2 - 33.4 pg   MCHC 33.9  32.0 - 36.0 g/dL   RBC 7.82 (*) 9.56 - 2.13 10e6/uL   RDW 17.8 (*) 11.0 - 14.6 %   lymph# 1.9  0.9 - 3.3 10e3/uL   MONO# 0.7  0.1 - 0.9 10e3/uL   Eosinophils Absolute 0.1  0.0 - 0.5 10e3/uL   Basophils Absolute 0.0  0.0 - 0.1 10e3/uL   NEUT% 54.4  39.0 - 75.0 %   LYMPH% 31.4  14.0 - 49.0 %   MONO% 12.0  0.0 - 14.0 %   EOS% 1.8  0.0 - 7.0 %   BASO% 0.4  0.0 - 2.0 %  MORPHOLOGY      Component Value Range   Polychromasia Slight  Slight   Tear Drop Cells Few  Negative   Ovalocytes Few  Negative   White Cell Comments C/W auto diff     PLT EST Decreased  Adequate    Hgb is up from 9.5on Nov 15 and 8.5 on Nov  5. Studies/Results:  No results found.  Medications: I have reviewed the patient's current medications. He continues oral iron.   Assessment/Plan: 1. Anemia: iron deficient likely from colon polyps + bleeding after colonoscopy/ biopsy and after bone marrow exam. No hemolysis recently tho he did have this problem also last year, possibly medication related (levoquin). 2.thrombocytopenia: variable, some lower now, without clear bleeding. Will follow and plan to transfuse platelets around Dr Hulen Shouts procedure 3.apparent platelet defect: transfuse around GI procedure as above. He is off ASA. 4.previous PEs, IVC filter in. He has not been on long term anticoagulation with these other problems. 5.recent E.Coli UTI with hypotension, resolved 6.long and ongoing tobacco, COPD, recurrent bronchitis. Needs to stop smoking, discussed 7.Peripheral vascular disease 8.HTN, hx tachycardia   I will see him back shortly prior to Dr Hulen Shouts procedure in Jan, and we will have him scheduled for platelet transfusion outpatient also shortly prior. He will need platelets available in blood bank for the procedure and may need to be kept overnight for observation then.    Artis Beggs P, MD   05/30/2012, 4:20 PM

## 2012-05-30 NOTE — Telephone Encounter (Signed)
appts made and printed for pt aom °

## 2012-06-04 ENCOUNTER — Other Ambulatory Visit: Payer: Self-pay | Admitting: Oncology

## 2012-06-04 DIAGNOSIS — D696 Thrombocytopenia, unspecified: Secondary | ICD-10-CM

## 2012-06-05 ENCOUNTER — Ambulatory Visit (INDEPENDENT_AMBULATORY_CARE_PROVIDER_SITE_OTHER): Payer: Medicare Other | Admitting: Internal Medicine

## 2012-06-05 ENCOUNTER — Other Ambulatory Visit (INDEPENDENT_AMBULATORY_CARE_PROVIDER_SITE_OTHER): Payer: Medicare Other

## 2012-06-05 ENCOUNTER — Telehealth: Payer: Self-pay | Admitting: *Deleted

## 2012-06-05 ENCOUNTER — Encounter: Payer: Self-pay | Admitting: Internal Medicine

## 2012-06-05 VITALS — BP 132/68 | HR 70 | Temp 97.9°F | Ht 71.0 in | Wt 134.1 lb

## 2012-06-05 DIAGNOSIS — N390 Urinary tract infection, site not specified: Secondary | ICD-10-CM

## 2012-06-05 DIAGNOSIS — N179 Acute kidney failure, unspecified: Secondary | ICD-10-CM

## 2012-06-05 DIAGNOSIS — K635 Polyp of colon: Secondary | ICD-10-CM | POA: Insufficient documentation

## 2012-06-05 DIAGNOSIS — J449 Chronic obstructive pulmonary disease, unspecified: Secondary | ICD-10-CM

## 2012-06-05 DIAGNOSIS — I10 Essential (primary) hypertension: Secondary | ICD-10-CM

## 2012-06-05 LAB — BASIC METABOLIC PANEL
CO2: 29 mEq/L (ref 19–32)
Calcium: 9 mg/dL (ref 8.4–10.5)
Creatinine, Ser: 1.4 mg/dL (ref 0.4–1.5)
GFR: 63.02 mL/min (ref >60.00)

## 2012-06-05 MED ORDER — AMLODIPINE BESYLATE 5 MG PO TABS: 5.0000 mg | ORAL_TABLET | Freq: Every day | ORAL | Status: DC

## 2012-06-05 NOTE — Telephone Encounter (Signed)
Per staff message and POF I have scheduled appts.  JMW  

## 2012-06-05 NOTE — Assessment & Plan Note (Addendum)
Ok to re-start the amlodipine 5 qd, o/w stable overall by hx and exam, most recent data reviewed with pt, and pt to continue medical treatment as before BP Readings from Last 3 Encounters:  06/05/12 132/68  05/30/12 125/80  05/12/12 166/87

## 2012-06-05 NOTE — Assessment & Plan Note (Signed)
For f/u bmet today, to f/u any worsening symptoms or concerns\

## 2012-06-05 NOTE — Assessment & Plan Note (Signed)
For f/u dec 22 colonosocpy with Dr Hinda Lenis GI

## 2012-06-05 NOTE — Assessment & Plan Note (Signed)
.  stagble SpO2 Readings from Last 3 Encounters:  06/05/12 97%  05/02/12 97%  02/21/12 98%

## 2012-06-05 NOTE — Progress Notes (Signed)
Subjective:    Patient ID: [name], male    DOB: 09-30-1934, 77 y.o.   MRN: [id number]  HPI  Here to f/u after d/c nov 2 with episode of UTI/sepsis/anemia/TCP/irondefic followed per hematology;  Had renal insuff acute as well with recommendation to f/u urine cx today;   Denies urinary symptoms such as dysuria, frequency, urgency,or hematuria.  Pt denies chest pain, increased sob or doe, wheezing, orthopnea, PND, increased LE swelling, palpitations, dizziness or syncope.  No overt bleeding or bruising.  Dec 3 plt 75K - ? Etiology. Had low BP at time of d/c, now BP increased. Pt denies new neurological symptoms such as new headache, or facial or extremity weakness or numbness   Pt denies polydipsia, polyuria. Past Medical History  Diagnosis Date  . AAA (abdominal aortic aneurysm)   . Thrombocytopenia   . Abdominal aneurysm without mention of rupture 04/23/2011  . Peripheral arterial disease 04/23/2011  . COPD (chronic obstructive pulmonary disease) 05/06/2011  . Shortness of breath   . Anemia   . Iron deficiency anemia 05/26/2011  . Hyperlipidemia 08/30/2011  . Liver damage 1960's    Several liver bx due to elevated liver functions  . Renal insufficiency 02/21/2012   Past Surgical History  Procedure Date  . Abdominal aortic aneurysm repair   . Pr vein bypass graft,aorto-fem-pop   . Esophagogastroduodenoscopy 06/10/2011    Procedure: ESOPHAGOGASTRODUODENOSCOPY (EGD);  Surgeon: Petra Kuba, MD;  Location: [facility] ENDOSCOPY;  Service: Endoscopy;  Laterality: N/A;    reports that he has been smoking Cigarettes.  He has a 12.5 pack-year smoking history. He has never used smokeless tobacco. He reports that he does not drink alcohol or use illicit drugs. family history includes COPD in his brother; Cancer in his mother; Diabetes in his sister; and Hypertension in his father. Allergies  Allergen Reactions  . Levofloxacin Other (See Comments)    HEMOLYTIC ANEMIA   Current Outpatient Prescriptions  on File Prior to Visit  Medication Sig Dispense Refill  . albuterol (PROVENTIL HFA;VENTOLIN HFA) 108 (90 BASE) MCG/ACT inhaler Inhale 1-2 puffs into the lungs every 4 (four) hours as needed. Wheezing      . atorvastatin (LIPITOR) 10 MG tablet Take 10 mg by mouth daily.      . ferrous fumarate (HEMOCYTE - 106 MG FE) 325 (106 FE) MG TABS Take 1 tablet by mouth.      . folic acid (FOLVITE) 1 MG tablet Take 1 mg by mouth daily.      . methimazole (TAPAZOLE) 5 MG tablet Take 5 mg by mouth daily.      . pantoprazole (PROTONIX) 40 MG tablet Take 40 mg by mouth daily.       Review of Systems  Constitutional: Negative for diaphoresis and unexpected weight change.  HENT: Negative for tinnitus.   Eyes: Negative for photophobia and visual disturbance.  Respiratory: Negative for choking and stridor.   Gastrointestinal: Negative for vomiting and blood in stool.  Genitourinary: Negative for hematuria and decreased urine volume.  Musculoskeletal: Negative for gait problem.  Skin: Negative for color change and wound.  Neurological: Negative for tremors and numbness.  Psychiatric/Behavioral: Negative for decreased concentration. The patient is not hyperactive.       Objective:   Physical Exam BP 132/68  Pulse 70  Temp 97.9 F (36.6 C) (Oral)  Ht 5\' 11"  (1.803 m)  Wt 134 lb 2 oz (60.839 kg)  BMI 18.71 kg/m2  SpO2 97% Physical Exam  VS noted, not ill  appearing Constitutional: Pt appears well-developed and well-nourished.  HENT: Head: Normocephalic.  Right Ear: External ear normal.  Left Ear: External ear normal.  Eyes: Conjunctivae and EOM are normal. Pupils are equal, round, and reactive to light.  Neck: Normal range of motion. Neck supple.  Cardiovascular: Normal rate and regular rhythm.   Pulmonary/Chest: Effort normal and breath sounds normal.  Abd:  Soft, NT, non-distended, + BS Neurological: Pt is alert. Not confused  Skin: Skin is warm. No erythema.  Psychiatric: Pt behavior is normal.  Thought content normal.     Assessment & Plan:

## 2012-06-05 NOTE — Assessment & Plan Note (Signed)
Asympt, for f/u urine cx today

## 2012-06-05 NOTE — Patient Instructions (Addendum)
OK to re-start the Amlodipine 5 mg per day (this was sent to the pharmacy) Continue all other medications as before Please have the pharmacy call with any other refills you may need. Please go to LAB in the Basement for the blood and/or urine tests to be done today You will be contacted by phone if any changes need to be made immediately.  Otherwise, you will receive a letter about your results with an explanation Please remember to sign up for My Chart at your earliest convenience, as this will be important to you in the future with finding out test results. Please keep your appointments with your specialists as you have planned - Dr [name]/hematology, and Dr Outlaw/Gastroenterology Please return in 6 months, or sooner if needed

## 2012-06-07 LAB — URINE CULTURE
Colony Count: NO GROWTH
Organism ID, Bacteria: NO GROWTH

## 2012-06-29 ENCOUNTER — Encounter (HOSPITAL_COMMUNITY): Payer: Self-pay | Admitting: Pharmacy Technician

## 2012-06-29 ENCOUNTER — Encounter (HOSPITAL_COMMUNITY)
Admission: RE | Admit: 2012-06-29 | Discharge: 2012-06-29 | Disposition: A | Discharge status: Home / Self Care | Payer: Medicare Other | Source: Ambulatory Visit | Attending: Oncology | Admitting: Oncology

## 2012-06-29 DIAGNOSIS — D691 Qualitative platelet defects: Secondary | ICD-10-CM

## 2012-06-29 DIAGNOSIS — D649 Anemia, unspecified: Secondary | ICD-10-CM | POA: Insufficient documentation

## 2012-07-03 ENCOUNTER — Encounter (HOSPITAL_COMMUNITY): Payer: Self-pay | Admitting: *Deleted

## 2012-07-03 NOTE — Pre-Procedure Instructions (Signed)
Your procedure is scheduled on:Wednesday, July 19, 2012 Report to [facility] Admitting at: 0830 Call this number if you have problems morning of your procedure:[phone]  Follow all bowel prep instructions per your doctor's orders.  Do not eat or drink anything after midnight the night before your procedure. You may brush your teeth, rinse out your mouth, but no water, no food, no chewing gum, no mints, no candies, no chewing tobacco.     Take these medicines the morning of your procedure with A SIP OF WATER:None   Please make arrangements for a responsible person to drive you home after the procedure. You cannot go home by cab/taxi. We recommend you have someone with you at home the first 24 hours after your procedure. Driver for procedure is wife Harriett Sine  LEAVE ALL VALUABLES, JEWELRY, BILLFOLD AT HOME.  NO DENTURES, CONTACT LENSES ALLOWED IN THE ENDOSCOPY ROOM.   YOU MAY WEAR DEODORANT, PLEASE REMOVE ALL JEWELRY, WATCHES RINGS, BODY PIERCINGS AND LEAVE AT HOME.   WOMEN: NO MAKE-UP, LOTIONS PERFUMES

## 2012-07-14 ENCOUNTER — Telehealth: Payer: Self-pay

## 2012-07-14 ENCOUNTER — Ambulatory Visit: Payer: BC Managed Care – PPO | Admitting: Oncology

## 2012-07-14 ENCOUNTER — Other Ambulatory Visit: Payer: BC Managed Care – PPO | Admitting: Lab

## 2012-07-14 ENCOUNTER — Telehealth: Payer: Self-pay | Admitting: Oncology

## 2012-07-14 NOTE — Telephone Encounter (Signed)
called pt as he had car trble today and we r/s appt to 1/20

## 2012-07-14 NOTE — Telephone Encounter (Signed)
Faxed order to [facility] blood bank to have phoresed plts. available for 07-18-12 0900.

## 2012-07-14 NOTE — Telephone Encounter (Signed)
Pt. Called stating that his car won't start so he will not be able to keep appointment today. Gave message to Thurston Hole in scheduling to reschedule.

## 2012-07-17 ENCOUNTER — Other Ambulatory Visit: Payer: Self-pay | Admitting: Lab

## 2012-07-17 ENCOUNTER — Other Ambulatory Visit: Payer: Self-pay | Admitting: Medical Oncology

## 2012-07-17 ENCOUNTER — Other Ambulatory Visit: Payer: Self-pay

## 2012-07-17 ENCOUNTER — Ambulatory Visit: Payer: Self-pay | Admitting: Oncology

## 2012-07-17 DIAGNOSIS — D696 Thrombocytopenia, unspecified: Secondary | ICD-10-CM

## 2012-07-17 NOTE — Progress Notes (Signed)
Medical oncology  Patient did not keep apt with MD today, but plans to come for lab and platelet transfusion 1-21 prior to GI procedure 07-19-12. He need CBC prior to transfusion and another CBC ~ 30 min after platelets are given on 07-18-12.  Ila Mcgill, MD

## 2012-07-18 ENCOUNTER — Other Ambulatory Visit: Payer: Medicare Other | Admitting: Lab

## 2012-07-18 ENCOUNTER — Other Ambulatory Visit: Payer: Self-pay | Admitting: Oncology

## 2012-07-18 ENCOUNTER — Encounter: Payer: Self-pay | Admitting: Oncology

## 2012-07-18 ENCOUNTER — Other Ambulatory Visit (HOSPITAL_BASED_OUTPATIENT_CLINIC_OR_DEPARTMENT_OTHER): Payer: Medicare Other | Admitting: Lab

## 2012-07-18 ENCOUNTER — Ambulatory Visit (HOSPITAL_BASED_OUTPATIENT_CLINIC_OR_DEPARTMENT_OTHER): Payer: Medicare Other

## 2012-07-18 VITALS — BP 147/87 | HR 60 | Temp 98.2°F | Resp 20

## 2012-07-18 DIAGNOSIS — D649 Anemia, unspecified: Secondary | ICD-10-CM

## 2012-07-18 DIAGNOSIS — Z86711 Personal history of pulmonary embolism: Secondary | ICD-10-CM

## 2012-07-18 DIAGNOSIS — D696 Thrombocytopenia, unspecified: Secondary | ICD-10-CM

## 2012-07-18 DIAGNOSIS — D691 Qualitative platelet defects: Secondary | ICD-10-CM

## 2012-07-18 LAB — CBC WITH DIFFERENTIAL/PLATELET
Basophils Absolute: 0 10*3/uL (ref 0.0–0.1)
Eosinophils Absolute: 0.1 10*3/uL (ref 0.0–0.5)
HCT: 33.1 % — ABNORMAL LOW (ref 38.4–49.9)
HCT: 32 % — ABNORMAL LOW (ref 38.4–49.9)
HGB: 11.3 g/dL — ABNORMAL LOW (ref 13.0–17.1)
LYMPH%: 27.6 % (ref 14.0–49.0)
MCHC: 33.6 g/dL (ref 32.0–36.0)
MCV: 82.9 fL (ref 79.3–98.0)
MONO#: 0.6 10*3/uL (ref 0.1–0.9)
MONO%: 8.8 % (ref 0.0–14.0)
NEUT#: 3 10*3/uL (ref 1.5–6.5)
NEUT#: 3.5 10*3/uL (ref 1.5–6.5)
NEUT%: 54.7 % (ref 39.0–75.0)
NEUT%: 60.9 % (ref 39.0–75.0)
Platelets: 136 10*3/uL — ABNORMAL LOW (ref 140–400)
RBC: 3.86 10*6/uL — ABNORMAL LOW (ref 4.20–5.82)
WBC: 5.5 10*3/uL (ref 4.0–10.3)
lymph#: 1.7 10*3/uL (ref 0.9–3.3)

## 2012-07-18 LAB — PROTIME-INR: INR: 1.1 — ABNORMAL LOW (ref 2.00–3.50)

## 2012-07-18 LAB — MORPHOLOGY: PLT EST: DECREASED

## 2012-07-18 MED ORDER — ACETAMINOPHEN 325 MG PO TABS
325.0000 mg | ORAL_TABLET | Freq: Once | ORAL | Status: AC
  Administered 2012-07-18: 325 mg via ORAL

## 2012-07-18 MED ORDER — SODIUM CHLORIDE 0.9 % IV SOLN
250.0000 mL | Freq: Once | INTRAVENOUS | Status: AC
  Administered 2012-07-18: 250 mL via INTRAVENOUS

## 2012-07-18 MED ORDER — DIPHENHYDRAMINE HCL 25 MG PO CAPS
25.0000 mg | ORAL_CAPSULE | Freq: Once | ORAL | Status: AC
  Administered 2012-07-18: 25 mg via ORAL

## 2012-07-18 NOTE — Progress Notes (Unsigned)
Medical Oncology  Patient missed MD appointment yesterday, but did come for CBC and platelet transfusion today, prior to repeat colonoscopy with removal of large polyp planned by Dr Dulce Sellar tomorrow. Platelet count prior to transfusion today 81k and post transfusion platelet count up to 136k. He has history of bleeding problems with ASA like platelet defect previously; additional platelets around procedure may be needed. I have discussed with [facility] blood bank now. They will have platelets available if needed for procedure tomorrow and will also try to have 2 units PRBCs available from [facility] in case needed, this being done now as patient has multiple antibodies and it would be very difficult to get PRBCs in emergency situation. Ila Mcgill, MD

## 2012-07-18 NOTE — Patient Instructions (Signed)
Platelet Transfusion Information This is information about transfusions of platelets. Platelets are tiny cells made by the bone marrow and found in the blood. When a blood vessel is damaged platelets rush to the damaged area to help form a clot. This begins the healing process. When platelets get very low your blood may have trouble clotting. This may be from:  Illness.  Blood disorder.  Chemotherapy to treat cancer. Often lower platelet counts do not usually cause problems.  Platelets usually last for 7 to 10 days. If they are not used not used in an injury, they are broken down by the liver or spleen. Symptoms of low platelet count include:  Nosebleeds.  Bleeding gums.  Heavy periods.  Bruising and tiny blood spots in the skin.  Pin point spots of bleeding are called (petechiae).  Larger bruises (purpura).  Bleeding can be more serious if it happens in the brain or bowel. Platelet transfusions are often used to keep the platelet count at an acceptable level. Serious bleeding due to low platelets is uncommon. RISKS AND COMPLICATIONS Severe side effects from platelet transfusions are uncommon. Minor reactions may include:  Itching.  Rashes.  High temperature and shivering. Medications are available to stop transfusion reactions. Let your caregivers know if you develop any of the above problems.  If you are having platelet transfusions frequently they may get less effective. This is called becoming refractory to platelets. It is uncommon. This can happen from non-immune causes and immune causes. Non-immune causes include:  High temperatures.  Some medications.  An enlarged spleen. Immune causes happen when your body discovers the platelets are not your own and begin making antibodies against them. The antibodies kill the platelets quickly. Even with platelet transfusions you may still notice problems with bleeding or bruising. Let your caregivers know about this. Other things  can be done to help if this happens.  BEFORE THE PROCEDURE   Your doctors will check your platelet count regularly.  If the platelet count is too low it may be necessary to have a platelet transfusion.  This is more important before certain procedures with a risk of bleeding such as a spinal tap.  Platelet transfusion reduces the risk of bleeding during or after the procedure.  Except in emergencies, giving a transfusion requires a written consent. Before blood is taken from a donor, a complete history is taken to make sure the person has no history of previous diseases, nor engages in risky social behavior. Examples of this are intravenous drug use or sexual activity with multiple partners. This could lead to infected blood or blood products being used. This history is done even in spite of the extensive testing to make sure the blood is safe. All blood products transfused are tested to make sure it is a match for the person getting the blood. It is also checked for infections. Blood is the safest it has ever been. The risk of getting an infection is very low. PROCEDURE  The platelets are stored in small plastic bags which are kept at a low temperature.  Each bag is called a unit and sometimes two units are given. They are given through an intravenous line by drip infusion over about one half hour.  Usually blood is collected from multiple people to get enough to transfuse.  Sometimes, the platelets are collected from a single person. This is done using a special machine that separates the platelets from the blood. The machine is called an apheresis machine. Platelets collected   in this way are called apheresed platelets. Apheresed platelets reduce the risk of becoming sensitive to the platelets. This lowers the chances of having a transfusion reaction.  As it only takes a short time to give the platelets, this treatment can be given in an outpatients department. Platelets can also be given  before or after other treatments. SEEK IMMEDIATE MEDICAL CARE IF: Any of the following symptoms over the next 12 hours or several days:  Shaking chills.  Fever with a temperature greater than 102 F (38.9 C) develops.  Back pain or muscle pain.  People around you feel you are not acting correctly, or you are confused.  Blood in the urine or bowel movements or bleeding from any place in your body.  Shortness of breath, or difficulty breathing.  Dizziness.  Fainting.  You break out in a rash or develop hives.  You have a decrease in the amount of urine you are putting out, or the urine turns a dark color or changes to pink, red, or brown.  A severe headache or stiff neck.  Bruising more easily. Document Released: 04/11/2007 Document Revised: 09/06/2011 Document Reviewed: 04/11/2007 [employer] Patient Information 2013 [employer].  

## 2012-07-19 ENCOUNTER — Ambulatory Visit (HOSPITAL_COMMUNITY)
Admission: RE | Admit: 2012-07-19 | Discharge: 2012-07-19 | Disposition: A | Discharge status: Home / Self Care | Payer: Medicare Other | Source: Ambulatory Visit | Attending: Gastroenterology | Admitting: Gastroenterology

## 2012-07-19 ENCOUNTER — Encounter (HOSPITAL_COMMUNITY): Payer: Self-pay

## 2012-07-19 ENCOUNTER — Encounter (HOSPITAL_COMMUNITY): Payer: Self-pay | Admitting: Anesthesiology

## 2012-07-19 ENCOUNTER — Ambulatory Visit (HOSPITAL_COMMUNITY): Payer: Medicare Other | Admitting: Anesthesiology

## 2012-07-19 ENCOUNTER — Encounter (HOSPITAL_COMMUNITY)
Admission: RE | Disposition: A | Discharge status: Home / Self Care | Payer: Self-pay | Source: Ambulatory Visit | Attending: Gastroenterology

## 2012-07-19 DIAGNOSIS — I739 Peripheral vascular disease, unspecified: Secondary | ICD-10-CM | POA: Insufficient documentation

## 2012-07-19 DIAGNOSIS — N289 Disorder of kidney and ureter, unspecified: Secondary | ICD-10-CM | POA: Insufficient documentation

## 2012-07-19 DIAGNOSIS — D126 Benign neoplasm of colon, unspecified: Secondary | ICD-10-CM | POA: Insufficient documentation

## 2012-07-19 DIAGNOSIS — K219 Gastro-esophageal reflux disease without esophagitis: Secondary | ICD-10-CM | POA: Insufficient documentation

## 2012-07-19 DIAGNOSIS — I10 Essential (primary) hypertension: Secondary | ICD-10-CM | POA: Insufficient documentation

## 2012-07-19 DIAGNOSIS — J449 Chronic obstructive pulmonary disease, unspecified: Secondary | ICD-10-CM | POA: Insufficient documentation

## 2012-07-19 DIAGNOSIS — K5730 Diverticulosis of large intestine without perforation or abscess without bleeding: Secondary | ICD-10-CM | POA: Insufficient documentation

## 2012-07-19 DIAGNOSIS — J4489 Other specified chronic obstructive pulmonary disease: Secondary | ICD-10-CM | POA: Insufficient documentation

## 2012-07-19 HISTORY — DX: Essential (primary) hypertension: I10

## 2012-07-19 HISTORY — DX: Gastro-esophageal reflux disease without esophagitis: K21.9

## 2012-07-19 HISTORY — PX: COLONOSCOPY WITH PROPOFOL: SHX5780

## 2012-07-19 LAB — PREPARE PLATELET PHERESIS

## 2012-07-19 SURGERY — COLONOSCOPY WITH PROPOFOL
Anesthesia: Monitor Anesthesia Care

## 2012-07-19 MED ORDER — LACTATED RINGERS IV SOLN
INTRAVENOUS | Status: DC | PRN
  Administered 2012-07-19: 09:00:00 via INTRAVENOUS

## 2012-07-19 MED ORDER — METOCLOPRAMIDE HCL 5 MG/ML IJ SOLN
INTRAMUSCULAR | Status: DC | PRN
  Administered 2012-07-19: 10 mg via INTRAVENOUS

## 2012-07-19 MED ORDER — DEXAMETHASONE SODIUM PHOSPHATE 10 MG/ML IJ SOLN
INTRAMUSCULAR | Status: DC | PRN
  Administered 2012-07-19: 10 mg via INTRAVENOUS

## 2012-07-19 MED ORDER — HYDRALAZINE HCL 20 MG/ML IJ SOLN
INTRAMUSCULAR | Status: DC | PRN
  Administered 2012-07-19: 4 mg via INTRAVENOUS

## 2012-07-19 MED ORDER — PROPOFOL 10 MG/ML IV EMUL
INTRAVENOUS | Status: DC | PRN
  Administered 2012-07-19: 100 ug/kg/min via INTRAVENOUS

## 2012-07-19 MED ORDER — PROPOFOL 10 MG/ML IV BOLUS
INTRAVENOUS | Status: DC | PRN
  Administered 2012-07-19: 50 mg via INTRAVENOUS
  Administered 2012-07-19: 30 mg via INTRAVENOUS

## 2012-07-19 MED ORDER — SODIUM CHLORIDE 0.9 % IV SOLN: INTRAVENOUS | Status: DC

## 2012-07-19 MED ORDER — ONDANSETRON HCL 4 MG/2ML IJ SOLN
INTRAMUSCULAR | Status: DC | PRN
  Administered 2012-07-19: 4 mg via INTRAVENOUS

## 2012-07-19 SURGICAL SUPPLY — 22 items

## 2012-07-19 NOTE — Interval H&P Note (Signed)
History and Physical Interval Note:  07/19/2012 9:27 AM  [name]  has presented today for surgery, with the diagnosis of adenoma loarge intestine  The various methods of treatment have been discussed with the patient and family. After consideration of risks, benefits and other options for treatment, the patient has consented to  Procedure(s) (LRB) with comments: COLONOSCOPY WITH PROPOFOL (N/A) - polyp lifter needed as a surgical intervention .  The patient's history has been reviewed, patient examined, no change in status, stable for surgery.  I have reviewed the patient's chart and labs.  Questions were answered to the patient's satisfaction.     Vincenza Dail M  Assessment:  1.  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  Plan:  1.  Colonoscopy with attempted polypectomy.  If endoscopic removal is not possible, patient will require surgical resection. 2.  Risks (bleeding, infection, bowel perforation that could require surgery, sedation-related changes in cardiopulmonary systems), benefits (identification and possible treatment of source of symptoms, exclusion of certain causes of symptoms), and alternatives (watchful waiting, radiographic imaging studies, empiric medical treatment) of colonoscopy were explained to patient/family in detail and patient wishes to proceed.

## 2012-07-19 NOTE — Op Note (Signed)
[facility] [address] [place], [address]   COLONOSCOPY PROCEDURE REPORT  PATIENT: [name], [name]  MR#: [id number] BIRTHDATE: 1935-01-11 , 77  yrs. old GENDER: Male ENDOSCOPIST: Willis Modena, MD REFERRED QI:ONGEX John, M.D.  Jama Flavors, M.D. PROCEDURE DATE:  07/19/2012 PROCEDURE:   Colonoscopy with snare polypectomy and Submucosal injection, any substance ASA CLASS:   Class III INDICATIONS:large colon polyp. MEDICATIONS: MAC sedation, administered by CRNA DESCRIPTION OF PROCEDURE:   After the risks benefits and alternatives of the procedure were thoroughly explained, informed consent was obtained.  A digital rectal exam revealed no abnormalities of the rectum.   The Pentax Ped Colon [id number] endoscope was introduced through the anus and advanced to the cecum, which was identified by both the appendix and ileocecal valve. No adverse events experienced.   The quality of the prep was fair.  The instrument was then slowly withdrawn as the colon was fully examined.  FINDINGS:  Digital rectal exam was normal; prep quality was diffusely fair; diminutive or subtle polyps could have been missed. Few sigmoid diverticula.  Couple sub-cm ascending colon polyps removed with snare cautery.  The previously-seen large transverse colon polyp,   25 x 20mm in size, was identified and the neighboring tattoo ink was seen.  The polyp was lifed after injection of   9 cc of saline via the ERBE submucosal lift device. The polyp was then removed piecemeal with snare cautery.  Base of polypectomy site looked good; no obvious residual polyp tissue was identified.   No other polyps, masses, vascular ectasias, or inflammatory changes were seen.  Retroflexed view of rectum was normal     .  Withdrawal time was over 20 minutes     .  The scope was withdrawn and the procedure completed. IMPRESSION: 1.  Removal of large transverse colon polyp, as above. 2.  Couple other smaller  proximal colon polyps removed, as above. RECOMMENDATIONS: 1.  Watch for potential complications of procedure, especially post polypectomy bleeding. 2.  Await polypectomy results. 3.  Avoid ASA/NSAIDs for 5 days post-polypectomy. 4.  Repeat colonoscopy in 6-12 months, pending pathology results.  eSigned:  Willis Modena, MD 07/19/2012 10:56 AM cc:

## 2012-07-19 NOTE — Transfer of Care (Signed)
Immediate Anesthesia Transfer of Care Note  Patient: [name]  Procedure(s) Performed: Procedure(s) (LRB) with comments: COLONOSCOPY WITH PROPOFOL (N/A) - polyp lifter needed  Patient Location: PACU and Endoscopy Unit  Anesthesia Type:MAC  Level of Consciousness: awake, alert , oriented, patient cooperative and responds to stimulation  Airway & Oxygen Therapy: Patient Spontanous Breathing and Patient connected to nasal cannula oxygen  Post-op Assessment: Report given to PACU RN, Post -op Vital signs reviewed and stable and Patient moving all extremities X 4  Post vital signs: Reviewed and stable  Complications: No apparent anesthesia complications

## 2012-07-19 NOTE — Anesthesia Preprocedure Evaluation (Addendum)
Anesthesia Evaluation    Airway Mallampati: II TM Distance: >3 FB Neck ROM: Full    Dental No notable dental hx. (+) Edentulous Upper and Edentulous Lower   Pulmonary COPDCurrent Smoker,  breath sounds clear to auscultation  Pulmonary exam normal       Cardiovascular hypertension, Pt. on medications + Peripheral Vascular Disease (s/p AAA repair) Rhythm:Regular Rate:Normal     Neuro/Psych    GI/Hepatic GERD-  Medicated,  Endo/Other    Renal/GU Renal InsufficiencyRenal disease     Musculoskeletal   Abdominal   Peds  Hematology thrombobytopenia   Anesthesia Other Findings   Reproductive/Obstetrics                          Anesthesia Physical Anesthesia Plan  ASA: III  Anesthesia Plan: MAC   Post-op Pain Management:    Induction:   Airway Management Planned: Simple Face Mask  Additional Equipment:   Intra-op Plan:   Post-operative Plan:   Informed Consent: I have reviewed the patients History and Physical, chart, labs and discussed the procedure including the risks, benefits and alternatives for the proposed anesthesia with the patient or authorized representative who has indicated his/her understanding and acceptance.   Dental advisory given  Plan Discussed with: CRNA  Anesthesia Plan Comments:         Anesthesia Quick Evaluation

## 2012-07-19 NOTE — Anesthesia Postprocedure Evaluation (Signed)
  Anesthesia Post-op Note  Patient: [name]  Procedure(s) Performed: Procedure(s) (LRB): COLONOSCOPY WITH PROPOFOL (N/A)  Patient Location: PACU  Anesthesia Type: MAC  Level of Consciousness: awake and alert   Airway and Oxygen Therapy: Patient Spontanous Breathing  Post-op Pain: mild  Post-op Assessment: Post-op Vital signs reviewed, Patient's Cardiovascular Status Stable, Respiratory Function Stable, Patent Airway and No signs of Nausea or vomiting  Last Vitals:  Filed Vitals:   07/19/12 1126  BP: 165/111  Pulse:   Temp:   Resp: 17    Post-op Vital Signs: stable   Complications: No apparent anesthesia complications

## 2012-07-19 NOTE — Preoperative (Signed)
Beta Blockers   Reason not to administer Beta Blockers:Not Applicable, not on home bb 

## 2012-07-19 NOTE — H&P (View-Only) (Signed)
Medical oncology  Patient did not keep apt with MD today, but plans to come for lab and platelet transfusion 1-21 prior to GI procedure 07-19-12. He need CBC prior to transfusion and another CBC ~ 30 min after platelets are given on 07-18-12.  L.Claudio Mondry, MD 

## 2012-07-20 ENCOUNTER — Encounter (HOSPITAL_COMMUNITY): Payer: Self-pay | Admitting: Gastroenterology

## 2012-07-20 LAB — PREPARE PLATELET PHERESIS

## 2012-07-22 LAB — TYPE AND SCREEN: Antibody Screen: NEGATIVE

## 2012-07-24 ENCOUNTER — Other Ambulatory Visit: Payer: Self-pay | Admitting: Oncology

## 2012-07-24 ENCOUNTER — Telehealth: Payer: Self-pay | Admitting: Oncology

## 2012-07-24 DIAGNOSIS — D696 Thrombocytopenia, unspecified: Secondary | ICD-10-CM

## 2012-07-24 NOTE — Telephone Encounter (Signed)
s/w pt and he is aware of his appt

## 2012-07-26 IMAGING — CR DG CHEST 1V PORT
1 series · 1 of 1 positions shown · non-contrast
Comparison: 04/05/2011

CLINICAL DATA: Abdominal aortic aneurysm

PORTABLE CHEST - 1 VIEW

[view not recorded]
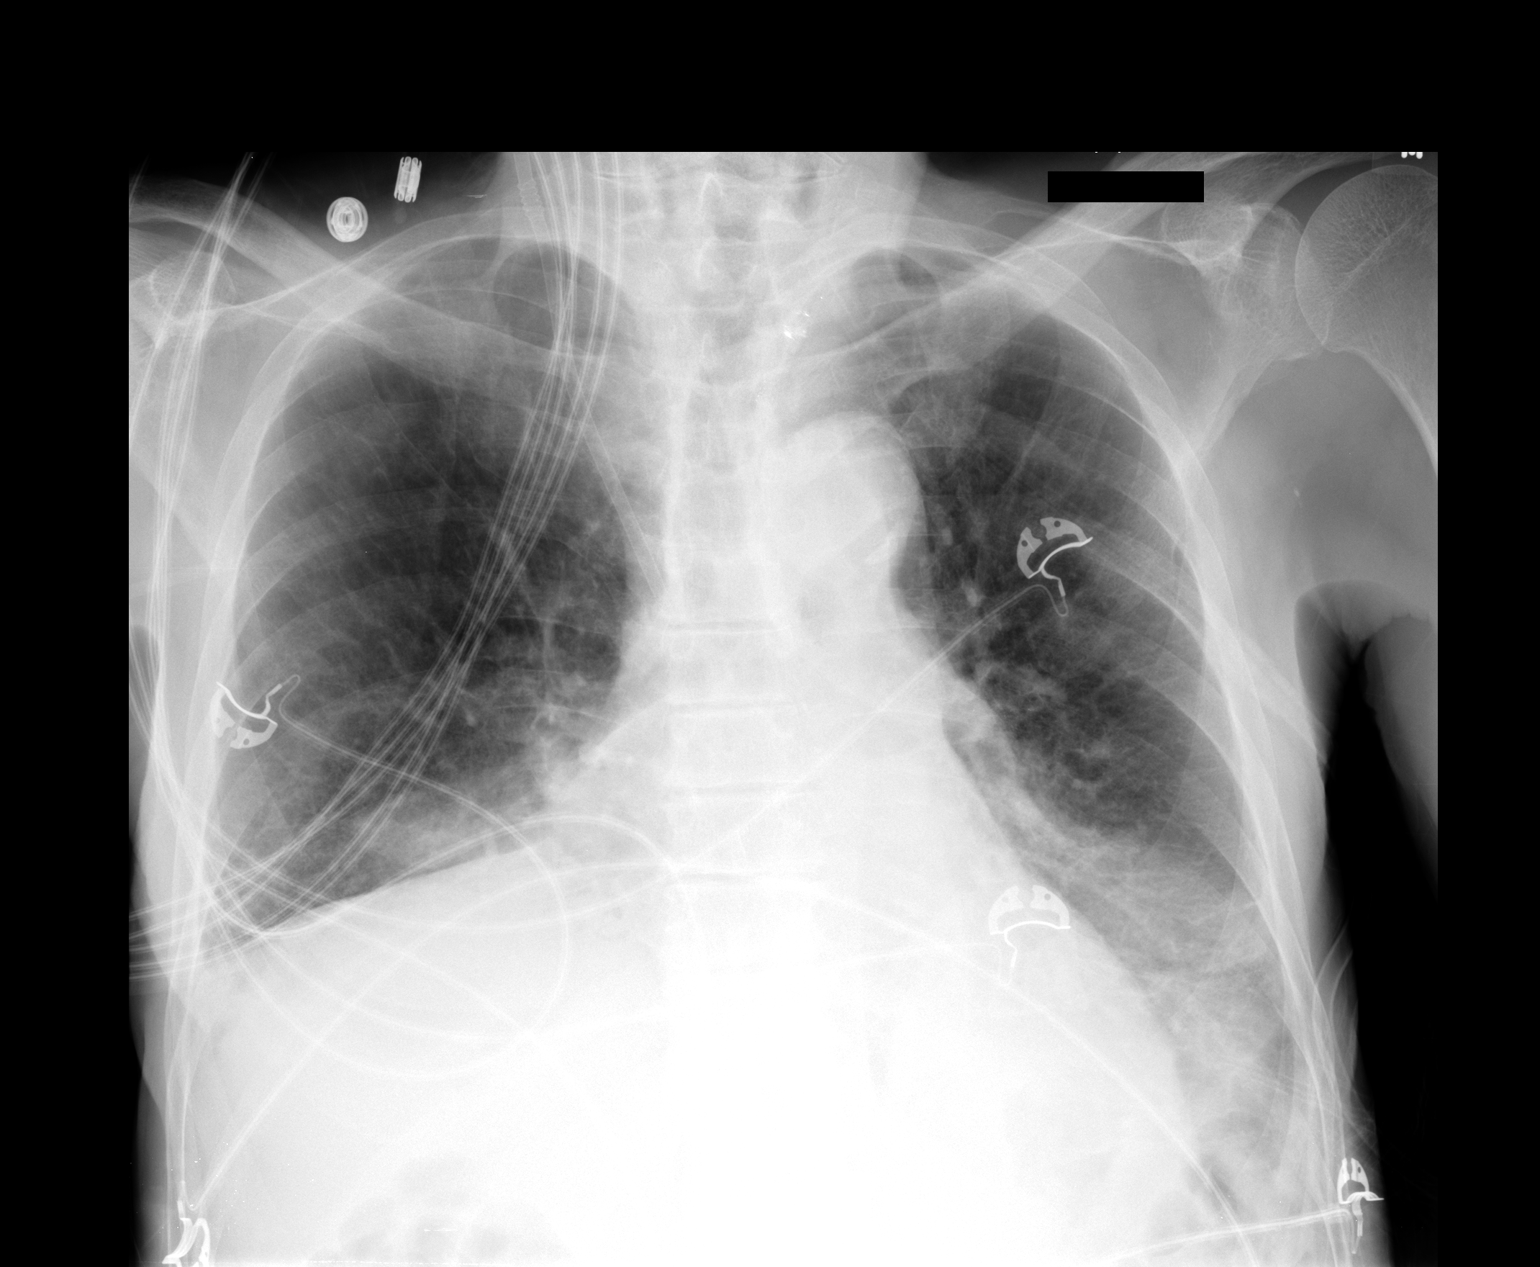

[1 of 1 positions shown; findings below may reference images not displayed]

FINDINGS: There is a right IJ catheter with tip in the projection
of the SVC.

Asymmetric elevation of the right hemidiaphragm is noted.

Complete atelectasis the right lower lobe is again noted and
appears unchanged.

There is airspace disease within the left base which is also
unchanged.  Mild interstitial edema remains.
IMPRESSION: No change in aeration to the lungs compared with prior exam.

## 2012-07-28 IMAGING — CR DG CHEST 1V PORT
1 series · 1 of 1 positions shown · non-contrast
Comparison: Portable chest x-ray of 04/06/2011

CLINICAL DATA: Shortness of breath

PORTABLE CHEST - 1 VIEW

[view not recorded]
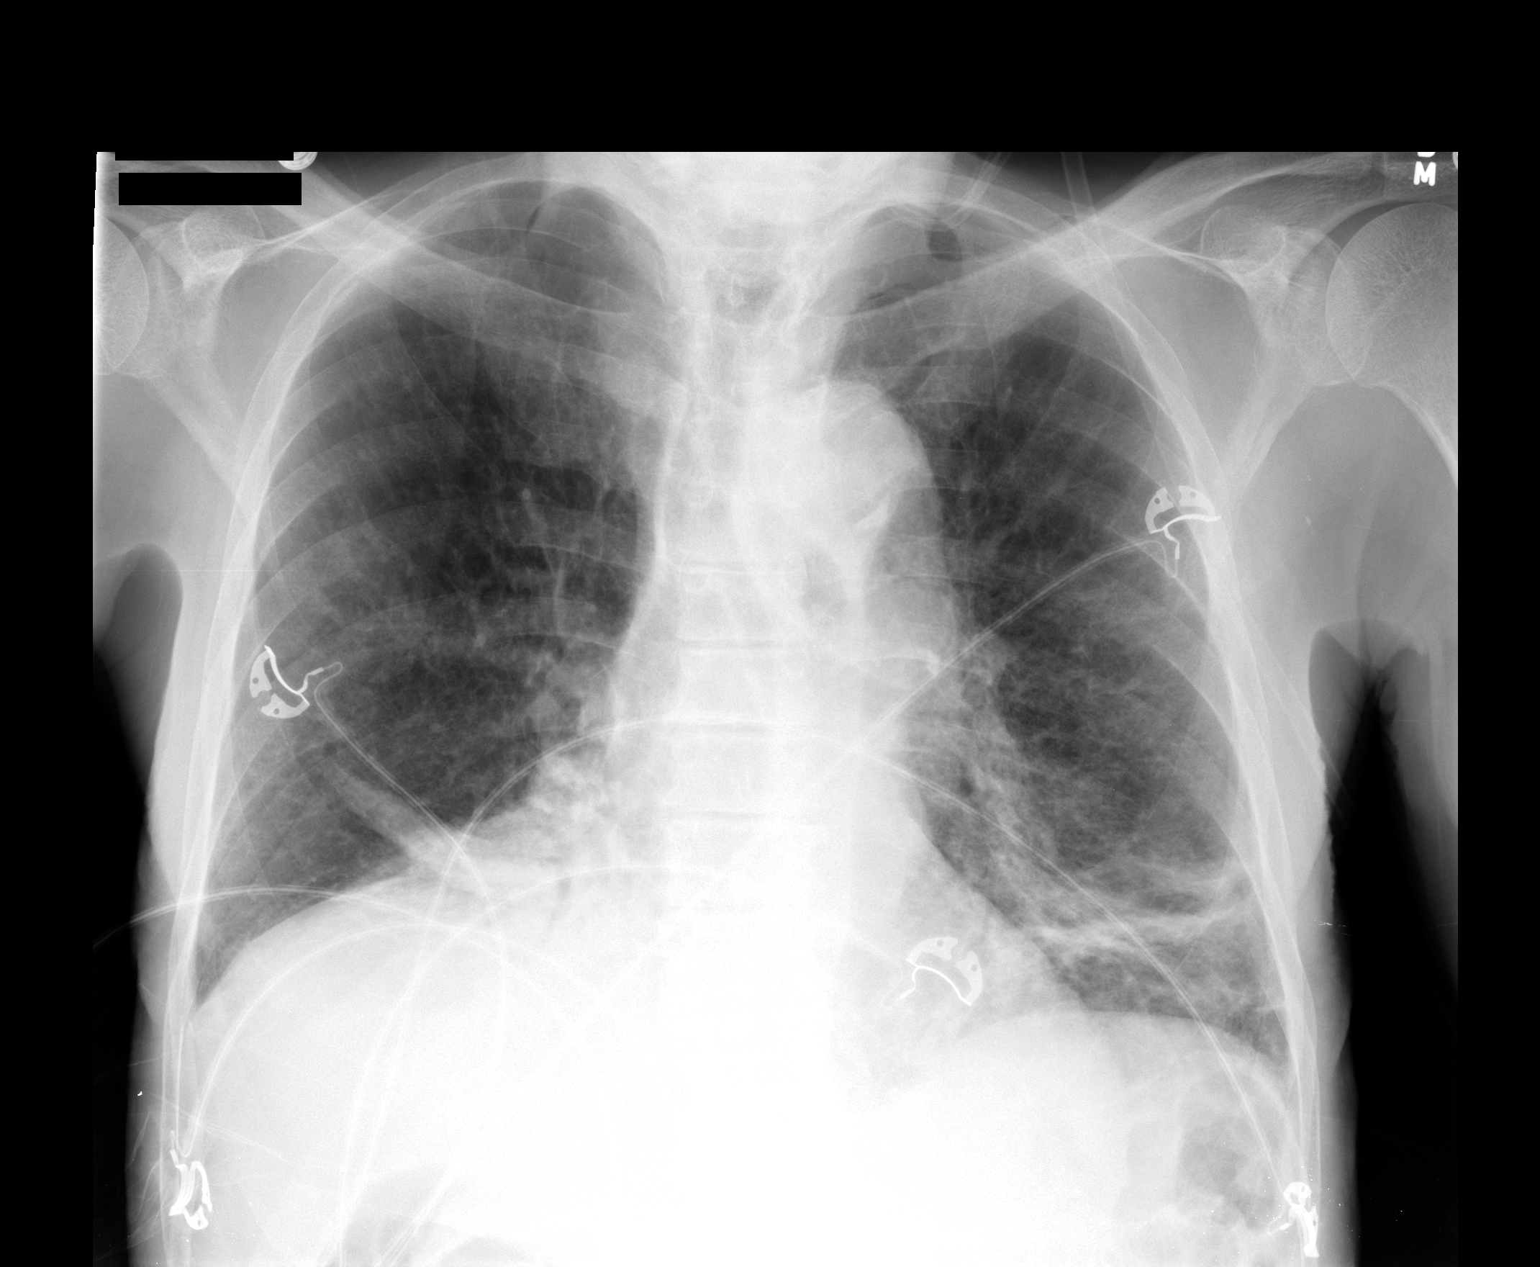

[1 of 1 positions shown; findings below may reference images not displayed]

FINDINGS: Linear atelectasis remains at both lung bases although
aeration has improved slightly.  Cardiomegaly is stable.  No
pneumothorax is seen.
IMPRESSION: Persistent basilar linear atelectasis.  Slightly better aeration.

## 2012-08-02 IMAGING — CR DG CHEST 1V PORT
1 series · 1 of 1 positions shown · non-contrast
Comparison: 04/10/2011

CLINICAL DATA: Evaluate for right lower lobe pneumonia.

PORTABLE CHEST - 1 VIEW

[view not recorded]
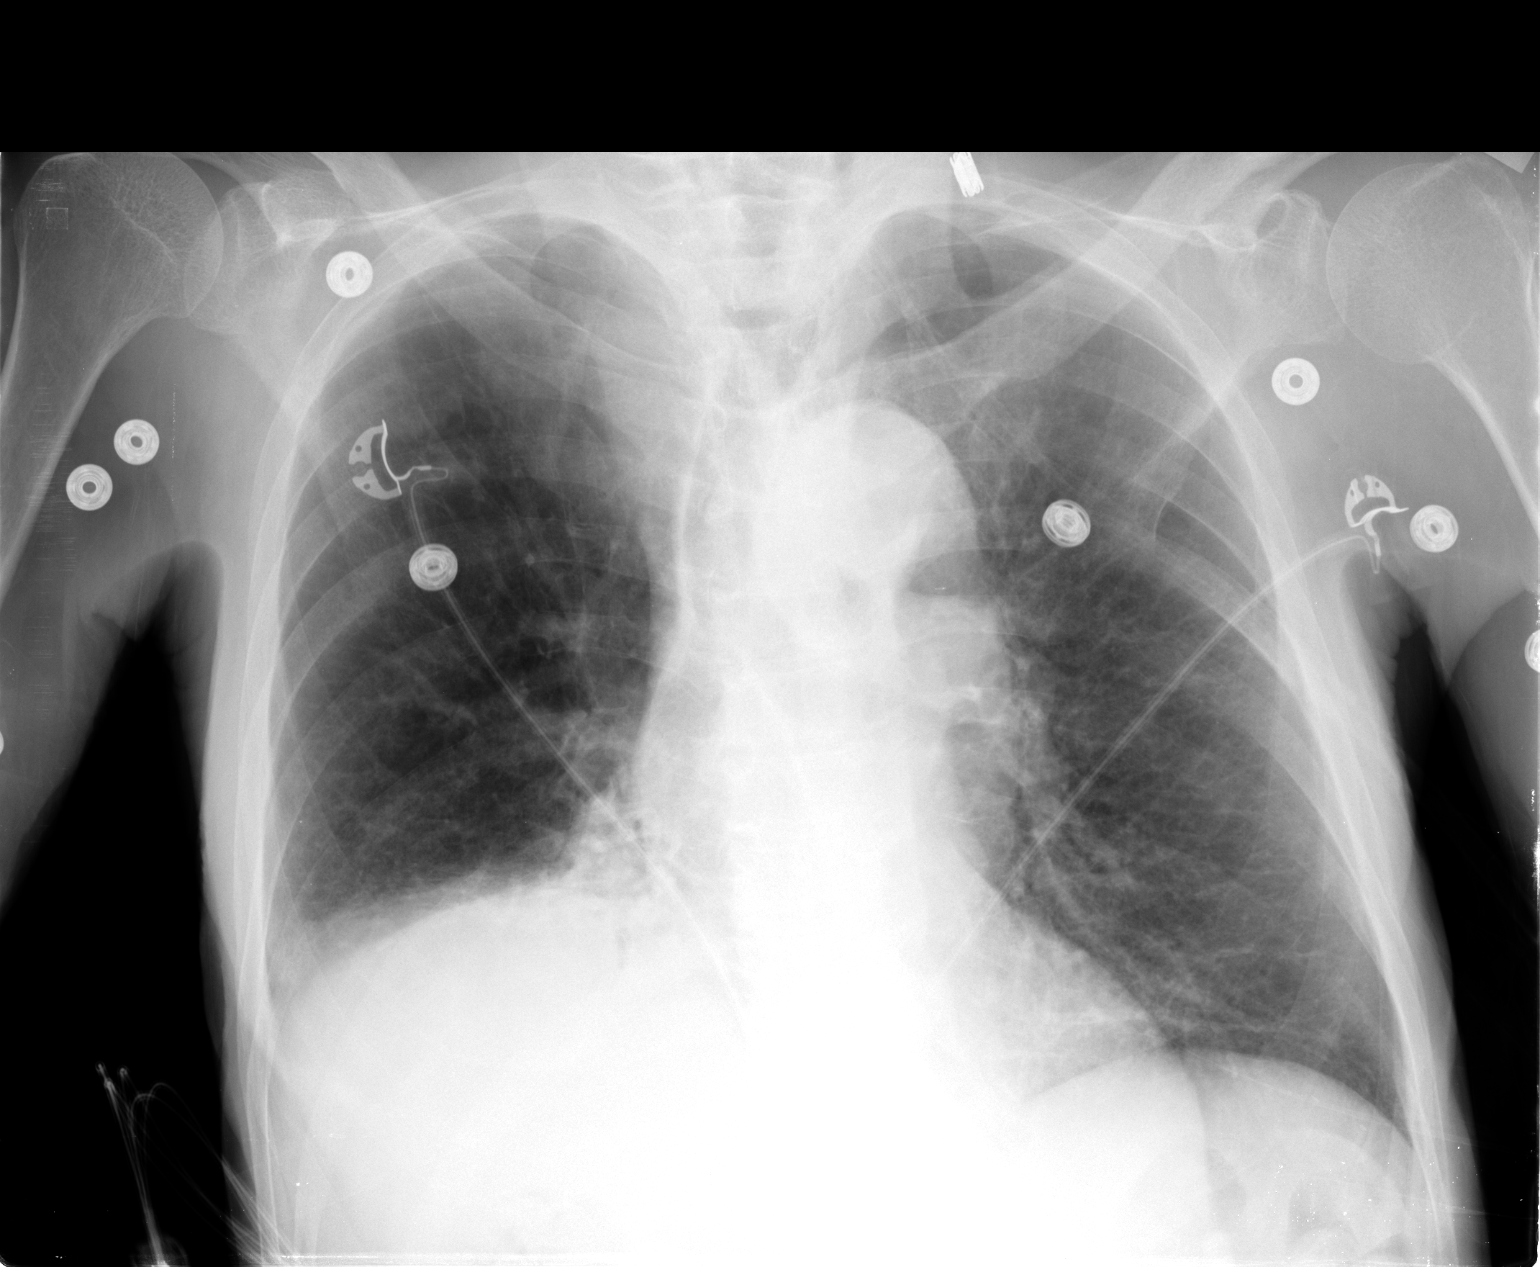

[1 of 1 positions shown; findings below may reference images not displayed]

FINDINGS: Single view of the chest again demonstrates elevation of
the right hemidiaphragm.  There is improved aeration at the left
lung base with decreased atelectasis.  Heart and mediastinum are
stable.  The thoracic aorta is tortuous.  No evidence for a large
pneumothorax.
IMPRESSION: Persistent elevation of the right hemidiaphragm.  Findings are most
likely related to volume loss and atelectasis.

Decreased atelectasis at the left lung base.

## 2012-08-08 ENCOUNTER — Ambulatory Visit (HOSPITAL_BASED_OUTPATIENT_CLINIC_OR_DEPARTMENT_OTHER): Payer: Medicare Other | Admitting: Oncology

## 2012-08-08 ENCOUNTER — Other Ambulatory Visit (HOSPITAL_BASED_OUTPATIENT_CLINIC_OR_DEPARTMENT_OTHER): Payer: Medicare Other | Admitting: Lab

## 2012-08-08 ENCOUNTER — Telehealth: Payer: Self-pay | Admitting: Oncology

## 2012-08-08 VITALS — BP 123/87 | HR 106 | Temp 97.4°F | Resp 20 | Ht 71.0 in | Wt 129.6 lb

## 2012-08-08 DIAGNOSIS — D696 Thrombocytopenia, unspecified: Secondary | ICD-10-CM

## 2012-08-08 DIAGNOSIS — K922 Gastrointestinal hemorrhage, unspecified: Secondary | ICD-10-CM

## 2012-08-08 DIAGNOSIS — J449 Chronic obstructive pulmonary disease, unspecified: Secondary | ICD-10-CM

## 2012-08-08 DIAGNOSIS — D500 Iron deficiency anemia secondary to blood loss (chronic): Secondary | ICD-10-CM

## 2012-08-08 LAB — CBC WITH DIFFERENTIAL/PLATELET
Basophils Absolute: 0 10*3/uL (ref 0.0–0.1)
Eosinophils Absolute: 0.1 10*3/uL (ref 0.0–0.5)
HCT: 30.3 % — ABNORMAL LOW (ref 38.4–49.9)
LYMPH%: 30.1 % (ref 14.0–49.0)
MCHC: 33.8 g/dL (ref 32.0–36.0)
MONO#: 0.5 10*3/uL (ref 0.1–0.9)
NEUT#: 3.3 10*3/uL (ref 1.5–6.5)
NEUT%: 58.5 % (ref 39.0–75.0)
Platelets: 103 10*3/uL — ABNORMAL LOW (ref 140–400)
WBC: 5.7 10*3/uL (ref 4.0–10.3)

## 2012-08-08 NOTE — Patient Instructions (Signed)
Stop smoking!  Keep Dr Raphael Gibney appointment in June. Dr Darrold Span will see you ~ Sept, or sooner if needed

## 2012-08-09 ENCOUNTER — Encounter: Payer: Self-pay | Admitting: Oncology

## 2012-08-09 NOTE — Progress Notes (Signed)
OFFICE PROGRESS NOTE  08/08/2012   Physicians:B.Chen, J.John, S.Ellison, D.Grapey, W.Outlaw   INTERVAL HISTORY:   Patient is seen, alone for visit, in follow up of anemia/ GI bleeding and thrombocytopenia since colonoscopic resection of large transverse colon polyp by Dr Dulce Sellar on 07-19-12. Patient received platelet transfusion prior to that procedure, with increase in platelets from 80k to 136k after the transfusion. He did well with the procedure, which successfully removed the polyp; pathology showed tubular adenoma without malignancy. Patient had no bleeding or BM for ~ 2 days after procedure, then 2 days with several black stools. He has not seen black stools or red blood with stools since then.  Patient has had complicated course since presentation fall 2012 with unusual bleeding into RUE. He has been iron deficient, which was reason for the colonoscopy. 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 He had some microhematuria, or possibly hemoglobinuria, was seen by Dr Isabel Caprice, but may not have followed up with cystoscopy. He also had excessive bleeding from bone marrow site Aug 2013 (requiring 2 ED visits) and has had ASA-like platelet defect, tho not clear if he was off ASA at that testing. Bone marrow was slightly hypercellular with abundant megakaryocytes and increased iron without ringed sideroblasts, and normal cytogenetics. Von Willebrands and factor VIII testing were normal.   He denies other bleeding. He has minimal cough without increase sputum and no hemoptysis. He continues to smoke 1 pack every 3 days; he does not express any desire to quit, tho I have strongly encouraged this. He denies fever, LE swelling, new or different pain. Remainder of 10 point Review of Systems negative.  Objective:  Vital signs in last 24 hours:  BP 123/87  Pulse  106  Temp(Src) 97.4 F (36.3 C) (Oral)  Resp 20  Ht 5\' 11"  (1.803 m)  Wt 129 lb 9.6 oz (58.786 kg)  BMI 18.08 kg/m2  Weight is down ~ 3 lbs. Easily ambulatory, looks comfortable.  HEENT:PERRLA, sclera clear, anicteric and oropharynx clear, no lesions LymphaticsCervical, supraclavicular, and axillary nodes normal. Resp: hyperresonant to percussion, diminished BS without wheezes or crackles Cardio: regular rate and rhythm GI: soft, non-tender; bowel sounds normal; no masses,  no organomegaly Extremities: extremities normal, atraumatic, no cyanosis or edema Neuro:nonfocal Skin without ecchymosis or petechiae   Lab Results:  Results for orders placed in visit on 08/08/12  CBC WITH DIFFERENTIAL      Result Value Range   WBC 5.7  4.0 - 10.3 10e3/uL   NEUT# 3.3  1.5 - 6.5 10e3/uL   HGB 10.3 (*) 13.0 - 17.1 g/dL   HCT 78.2 (*) 95.6 - 21.3 %   Platelets 103 (*) 140 - 400 10e3/uL   MCV 84.7  79.3 - 98.0 fL   MCH 28.6  27.2 - 33.4 pg   MCHC 33.8  32.0 - 36.0 g/dL   RBC 0.86 (*) 5.78 - 4.69 10e6/uL   RDW 16.2 (*) 11.0 - 14.6 %   lymph# 1.7  0.9 - 3.3 10e3/uL   MONO# 0.5  0.1 - 0.9 10e3/uL   Eosinophils Absolute 0.1  0.0 - 0.5 10e3/uL   Basophils Absolute 0.0  0.0 - 0.1 10e3/uL   NEUT% 58.5  39.0 - 75.0 %   LYMPH% 30.1  14.0 - 49.0 %   MONO% 9.2  0.0 - 14.0 %   EOS% 1.5  0.0 - 7.0 %  BASO% 0.7  0.0 - 2.0 %    Hgb down from prior to procedure, likely with some bleeding/ dark stools afterwards as noted. Studies/Results:  No results found.  Medications: I have reviewed the patient's current medications.  Assessment/Plan: 1.Thrombocytopenia which has been variable, and platelet function defect: did well overall with recent colon polyp resection after receiving platelets prior to procedure 2.anemia: hopefully polyp removal will resolve GI bleeding. Also hx  AIHA thought related to levaquin, resolved 3.previous pulmonary emboli: IVC filter in. No long term anticoagulation with  bleeding problems 4.long and ongoing tobacco, COPD, previous significant respiratory infections. Education done again today 5.peripheral vascular disease 6.previous E coli UTI 7. HTN , hx tachycardia   He will keep appointment with Dr Jonny Ruiz as scheduled in June and I will see him again in ~ Aug-Sept, or sooner if needed.     Reece Packer, MD

## 2012-09-06 ENCOUNTER — Other Ambulatory Visit: Payer: Self-pay | Admitting: Internal Medicine

## 2012-09-27 IMAGING — CT CT ANGIO ABDOMEN
2 of 6 series · 15 of 46 positions shown, 17 images · IV contrast (APPLIED)
Comparison: CT chest 04/14/2011

CTA CHEST

CLINICAL DATA: New onset abdominal pain, back pain, and rib cage
pain.

CT ANGIOGRAPHY CHEST, ABDOMEN AND PELVIS
TECHNIQUE: Multidetector CT imaging through the chest and abdomen
was performed using the standard protocol during bolus
administration of intravenous contrast.  Multiplanar reconstructed
images including MIPs were obtained and reviewed to evaluate the
vascular anatomy.
Contrast: 100mL OMNIPAQUE IOHEXOL 350 MG/ML IV SOLN

[Series 6: dissection 2.0 st · axial · 0.75mm/px · z∈[-214,+142]mm · 12 of 196 slices shown, 14 images]
[im 9/196  soft-tissue]
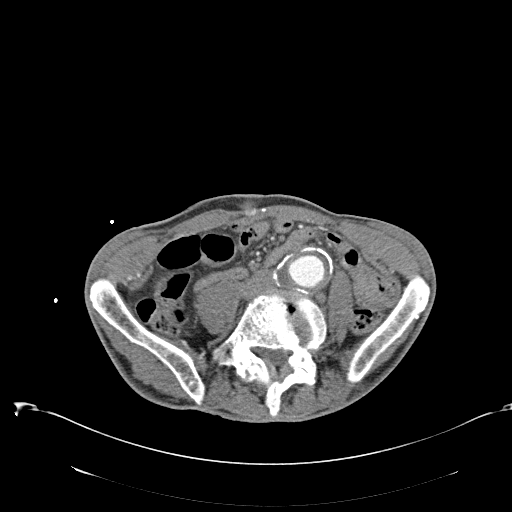
[im 9/196  bone]
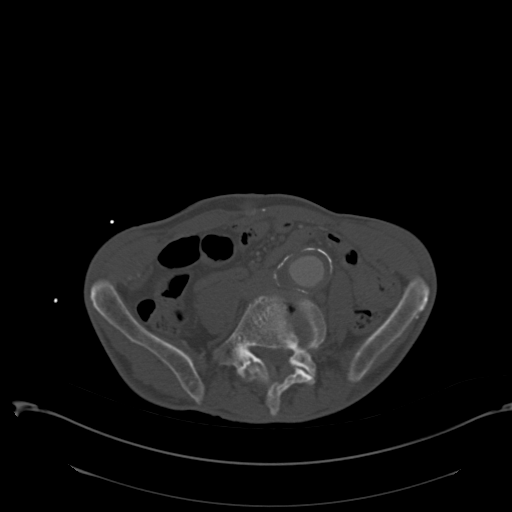
[im 27/196  soft-tissue]
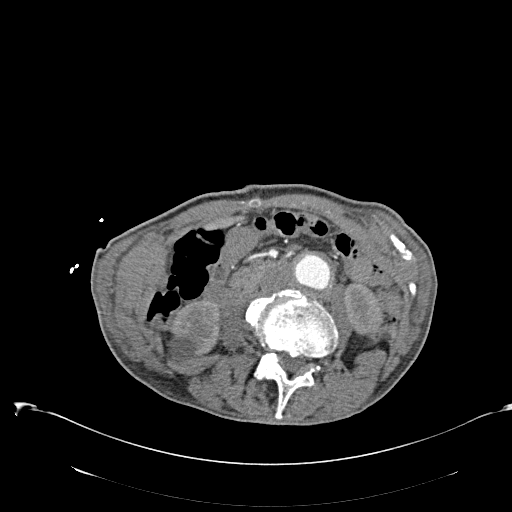
[im 45/196  soft-tissue]
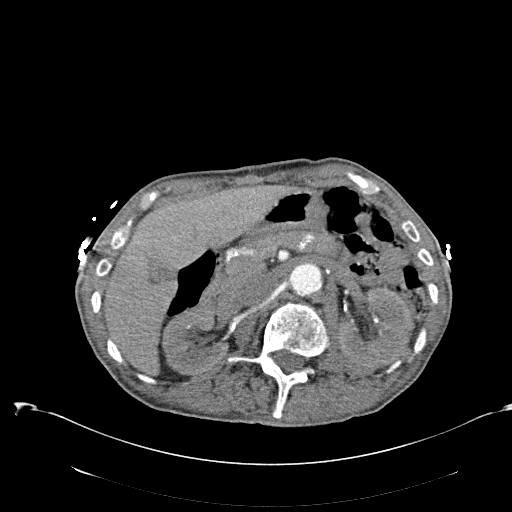
[im 63/196  soft-tissue]
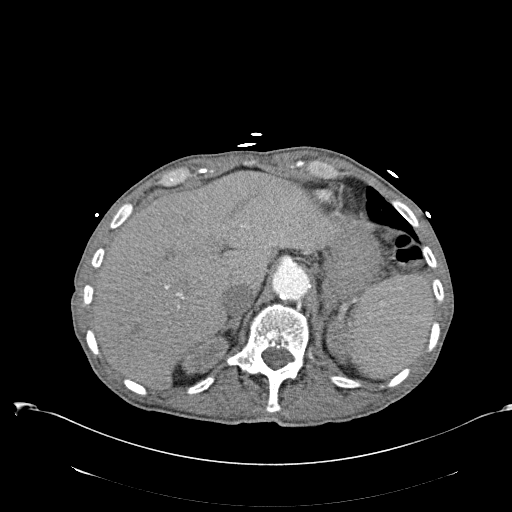
[im 71/196  soft-tissue]
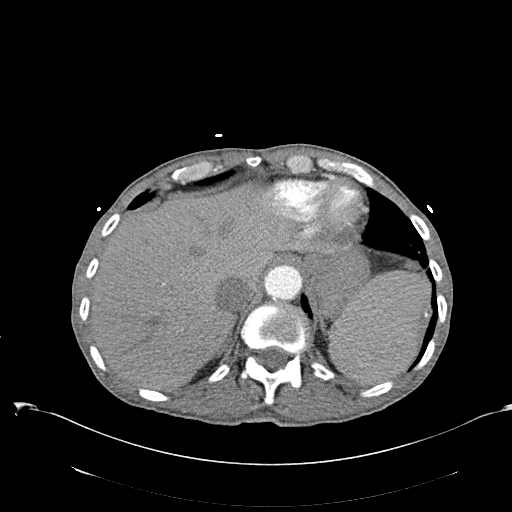
[im 89/196  soft-tissue]
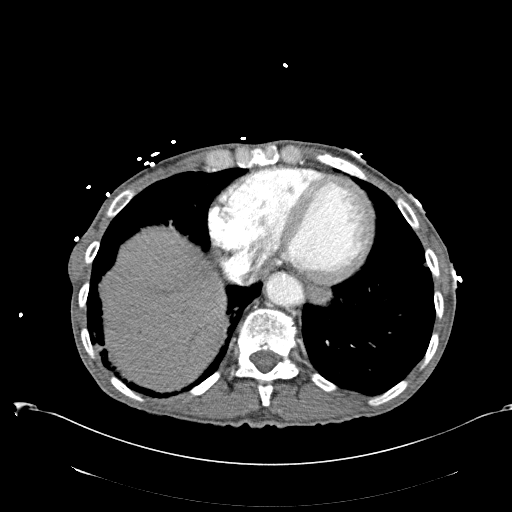
[im 107/196  soft-tissue]
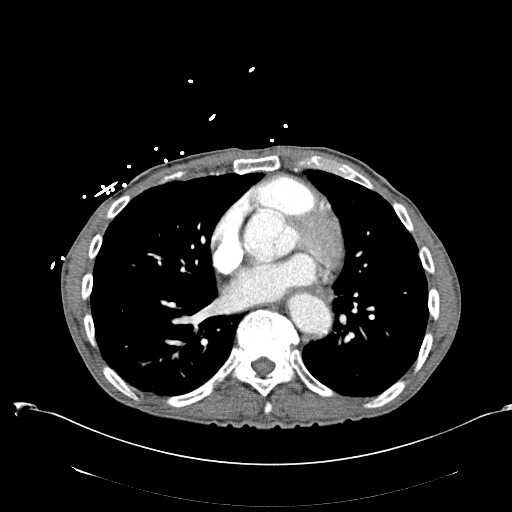
[im 125/196  soft-tissue]
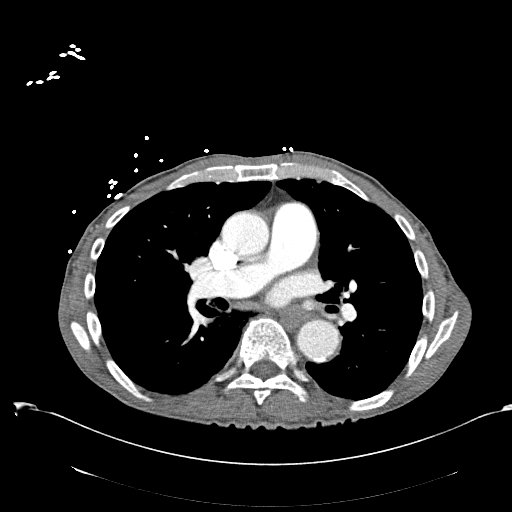
[im 133/196  soft-tissue]
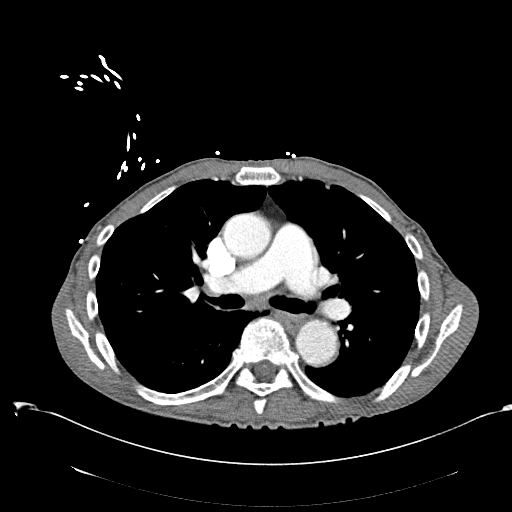
[im 133/196  bone]
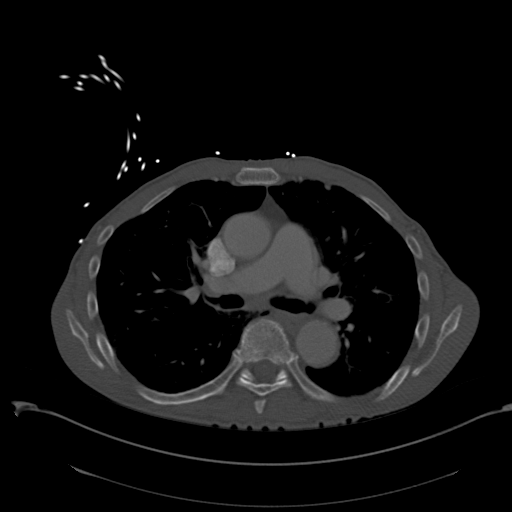
[im 151/196  soft-tissue]
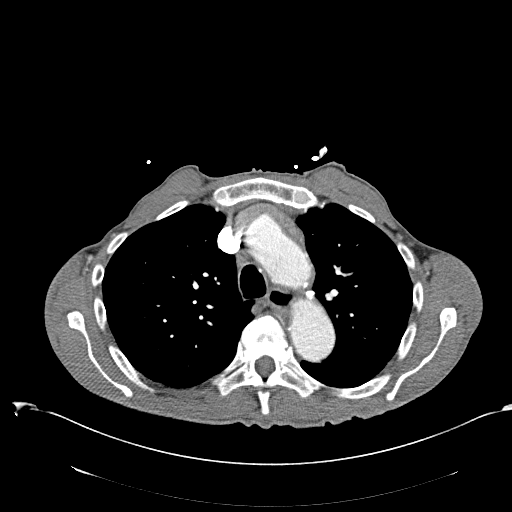
[im 169/196  soft-tissue]
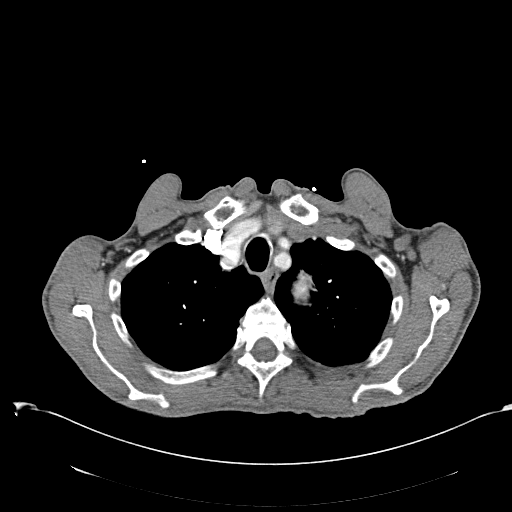
[im 187/196  soft-tissue]
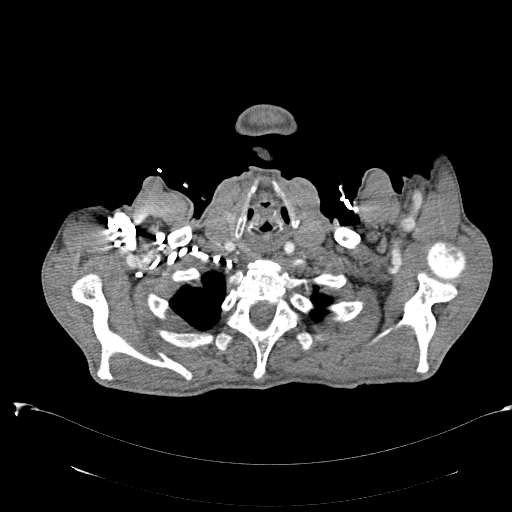

[Series 602: cor · coronal · 0.77mm/px · 3 of 103 slices shown]
[im 26/103  soft-tissue]
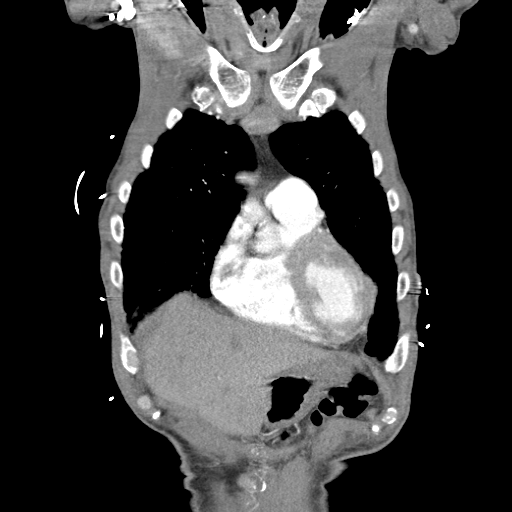
[im 52/103  soft-tissue]
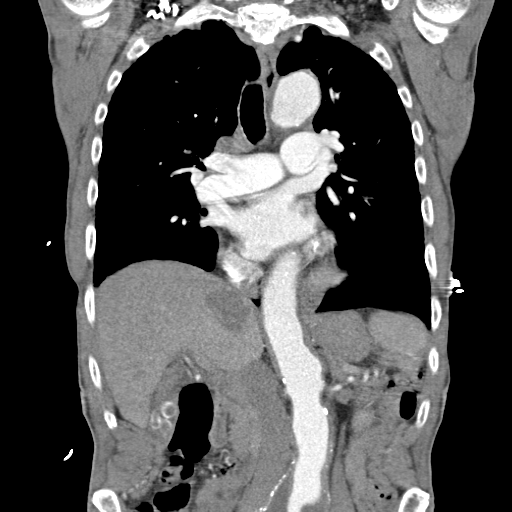
[im 77/103  soft-tissue]
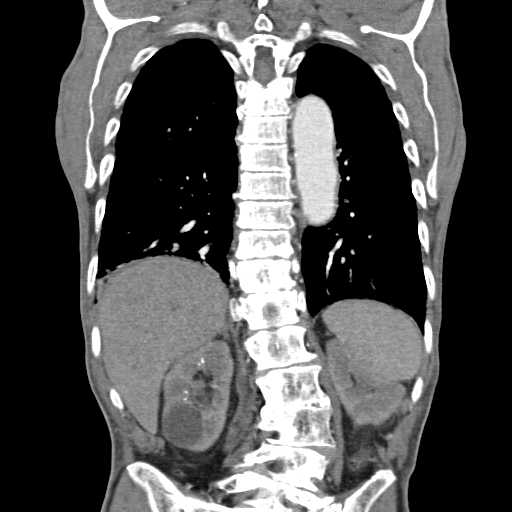

[15 of 46 positions shown; findings below may reference images not displayed]

FINDINGS: Unenhanced CT images of the chest and abdomen
demonstrate thoracic aortic ectasia with calcification.  Coronary
artery calcification.  No evidence of intramural thrombus.

Tortuous and ectatic thoracic aorta.  Maximal AP diameter is about
3.5 cm in the ascending aorta.  No evidence of dissection.  No
abnormal mediastinal or pleural fluid collections.  No significant
lymphadenopathy in the chest.  No pleural effusion.  A filling
defect is demonstrated and anterior right upper lung pulmonary
artery suggesting subsegmental pulmonary embolus.  No other
pulmonary arterial filling defects are demonstrated.  Emphysematous
changes and interstitial fibrosis in the lungs.  Atelectasis in the
lung bases.  No pneumothorax.  Nodules seen previously in the
superior segment of the left lower lung is not visualized today and
may have been inflammatory. Enlarged nodular thyroid gland as
previously demonstrated.

 Review of the MIP images confirms the above findings.
IMPRESSION: Ectatic thoracic aorta without aneurysm or dissection.  Filling
defect in a right upper lung pulmonary arteries suggesting
subsegmental pulmonary embolus.  Emphysema and fibrosis in the
lungs.

CTA ABDOMEN
FINDINGS: The study is abdominal and extends to the upper aspect
of the iliac crest.  This does not include the entire aneurysm
repair.  Distal aneurysm sac and repair remains indeterminate as it
is not visualized.

There is an infrarenal abdominal aortic aneurysm post repair.
Calcification of the native sac with thrombosis of the native sac
and patency of the graft.  No retroperitoneal fluid collection or
contrast extravasation.  Atherosclerotic changes in the upper
abdominal aorta.  Vascular calcifications in the branch vessels.
Superior mesenteric artery, celiac axis, and renal artery origins
appear patent.  The inferior mesenteric artery is not
demonstrated.Calcified granulomas in the spleen.  The liver
parenchyma is homogeneous.  Cholelithiasis.  No bile duct
dilatation.  The pancreas and adrenal glands are unremarkable.
Suggestion of small stones versus vascular calcifications
throughout the kidneys.  No evidence of obstruction.  Cyst in the
right kidney measures about 3.3 cm diameter.  Visualized stomach,
small bowel, and large bowel are decompressed.  No free air or free
fluid in the abdomen.  Degenerative changes in the thoracic and
lumbar spine.

 Review of the MIP images confirms the above findings.
IMPRESSION: Postoperative change with repair of abdominal aortic aneurysm.
This is not completely included on the study.  No retroperitoneal
fluid collection or contrast extravasation demonstrated in the
visualized abdomen.  No free air or free fluid in the abdomen.
Cholelithiasis.

Results discussed with Dr. Dobos at the time of dictation, 5725
hours on 06/08/2011.

## 2012-10-10 ENCOUNTER — Other Ambulatory Visit (INDEPENDENT_AMBULATORY_CARE_PROVIDER_SITE_OTHER): Payer: Medicare Other

## 2012-10-10 ENCOUNTER — Encounter: Payer: Self-pay | Admitting: Internal Medicine

## 2012-10-10 ENCOUNTER — Ambulatory Visit (INDEPENDENT_AMBULATORY_CARE_PROVIDER_SITE_OTHER): Payer: Medicare Other | Admitting: Internal Medicine

## 2012-10-10 VITALS — BP 110/78 | HR 103 | Temp 97.9°F | Ht 71.0 in | Wt 128.0 lb

## 2012-10-10 DIAGNOSIS — R0989 Other specified symptoms and signs involving the circulatory and respiratory systems: Secondary | ICD-10-CM

## 2012-10-10 DIAGNOSIS — R0600 Dyspnea, unspecified: Secondary | ICD-10-CM

## 2012-10-10 DIAGNOSIS — R202 Paresthesia of skin: Secondary | ICD-10-CM | POA: Insufficient documentation

## 2012-10-10 DIAGNOSIS — D699 Hemorrhagic condition, unspecified: Secondary | ICD-10-CM

## 2012-10-10 DIAGNOSIS — R0609 Other forms of dyspnea: Secondary | ICD-10-CM

## 2012-10-10 DIAGNOSIS — J449 Chronic obstructive pulmonary disease, unspecified: Secondary | ICD-10-CM

## 2012-10-10 DIAGNOSIS — M79651 Pain in right thigh: Secondary | ICD-10-CM | POA: Insufficient documentation

## 2012-10-10 DIAGNOSIS — R209 Unspecified disturbances of skin sensation: Secondary | ICD-10-CM

## 2012-10-10 DIAGNOSIS — M7989 Other specified soft tissue disorders: Secondary | ICD-10-CM

## 2012-10-10 DIAGNOSIS — M79609 Pain in unspecified limb: Secondary | ICD-10-CM

## 2012-10-10 LAB — CBC WITH DIFFERENTIAL/PLATELET
Eosinophils Absolute: 0 10*3/uL (ref 0.0–0.7)
Eosinophils Relative: 0.1 % (ref 0.0–5.0)
HCT: 28.2 % — ABNORMAL LOW (ref 39.0–52.0)
Lymphs Abs: 1.2 10*3/uL (ref 0.7–4.0)
MCHC: 33.7 g/dL (ref 30.0–36.0)
MCV: 85.4 fl (ref 78.0–100.0)
Monocytes Absolute: 0.8 10*3/uL (ref 0.1–1.0)
Neutrophils Relative %: 75.5 % (ref 43.0–77.0)
Platelets: 83 10*3/uL — ABNORMAL LOW (ref 150.0–400.0)
RDW: 13.9 % (ref 11.5–14.6)
WBC: 8.2 10*3/uL (ref 4.5–10.5)

## 2012-10-10 LAB — LACTATE DEHYDROGENASE: LDH: 256 U/L — ABNORMAL HIGH (ref 94–250)

## 2012-10-10 LAB — HAPTOGLOBIN: Haptoglobin: 216 mg/dL — ABNORMAL HIGH (ref 45–215)

## 2012-10-10 MED ORDER — HYDROCODONE-ACETAMINOPHEN 5-325 MG PO TABS: 1.0000 | ORAL_TABLET | Freq: Four times a day (QID) | ORAL | Status: DC | PRN

## 2012-10-10 MED ORDER — CEPHALEXIN 500 MG PO CAPS: 500.0000 mg | ORAL_CAPSULE | Freq: Four times a day (QID) | ORAL | Status: DC

## 2012-10-10 NOTE — Patient Instructions (Addendum)
Please take all new medication as prescribed - the antibiotic, and the pain medication Please continue all other medications as before Your left ear was irrigated of wax today Please wear the right wrist splint at NIGHT only to see if helps with the hand numbness You will be contacted regarding the referral for: MRI for the right upper leg to see if there is bleeding into the muscle Please go to the LAB in the Basement (turn left off the elevator) for the tests to be done today Please keep your appointments with your specialists as you have planned - Hematology Please remember to sign up for My Chart if you have not done so, as this will be important to you in the future with finding out test results, communicating by private email, and scheduling acute appointments online when needed.

## 2012-10-10 NOTE — Progress Notes (Signed)
Subjective:    Patient ID: [name], male    DOB: 09/03/1934, 78 y.o.   MRN: [id number]  HPI  Here with 2 days onset mod to severe sharp and dull right post thigh pain, swelling, redness without radiation, but tender to touch, sit, and more pain with standing, only better to sit still with less pressure to the area; no prior hx of similar to that area, No knee or more distal LE sweling/pain/redness, no trauma, fever, chills and Pt denies chest pain, increased sob or doe, wheezing, orthopnea, PND, increased LE swelling, palpitations, dizziness or syncope, except for ? Mild increased doe.   Pt denies polydipsia, polyuria.  Pt denies new neurological symptoms such as new headache, or facial or extremity weakness or numbness except for distal RUE numbness recurring for several wks for unclear reason (no weakness or pain), ? Worse in the am.  No neck or other arm pain, trauma, redness or swelling.  Has complicated hx of hemolytic and iron def anemia, TCP, bleeding diathesis as well as hx of PE s/p IVC filter not felt to be anticoag candidate per Dr Darrold Span. Also with copd/ckd/PVD Past Medical History  Diagnosis Date  . AAA (abdominal aortic aneurysm)   . Thrombocytopenia   . Abdominal aneurysm without mention of rupture 04/23/2011  . Peripheral arterial disease 04/23/2011  . COPD (chronic obstructive pulmonary disease) 05/06/2011  . Shortness of breath   . Hyperlipidemia 08/30/2011  . Liver damage 1960's    Several liver bx due to elevated liver functions  . Hypertension   . GERD (gastroesophageal reflux disease)   . Anemia   . Iron deficiency anemia 05/26/2011  . Renal insufficiency 02/21/2012   Past Surgical History  Procedure Laterality Date  . Abdominal aortic aneurysm repair    . Pr vein bypass graft,aorto-fem-pop    . Esophagogastroduodenoscopy  06/10/2011    Procedure: ESOPHAGOGASTRODUODENOSCOPY (EGD);  Surgeon: Petra Kuba, MD;  Location: [facility] ENDOSCOPY;  Service: Endoscopy;  Laterality:  N/A;  . Colonoscopy with propofol  07/19/2012    Procedure: COLONOSCOPY WITH PROPOFOL;  Surgeon: Willis Modena, MD;  Location: [facility] ENDOSCOPY;  Service: Endoscopy;  Laterality: N/A;  polyp lifter needed    reports that he has been smoking Cigarettes.  He has a 12.5 pack-year smoking history. He has never used smokeless tobacco. He reports that he does not drink alcohol or use illicit drugs. family history includes COPD in his brother; Cancer in his mother; Diabetes in his sister; and Hypertension in his father. Allergies  Allergen Reactions  . Levofloxacin Other (See Comments)    HEMOLYTIC ANEMIA   Current Outpatient Prescriptions on File Prior to Visit  Medication Sig Dispense Refill  . amLODipine (NORVASC) 5 MG tablet Take 5 mg by mouth daily before breakfast.      . atorvastatin (LIPITOR) 10 MG tablet Take 10 mg by mouth daily before breakfast.       . ferrous fumarate (HEMOCYTE - 106 MG FE) 325 (106 FE) MG TABS Take 1 tablet by mouth.      . folic acid (FOLVITE) 1 MG tablet Take 1 mg by mouth daily.      . pantoprazole (PROTONIX) 40 MG tablet Take 40 mg by mouth daily.       No current facility-administered medications on file prior to visit.   Review of Systems  Constitutional: Negative for unexpected weight change, or unusual diaphoresis  HENT: Negative for tinnitus.   Eyes: Negative for photophobia and visual disturbance.  Respiratory: Negative  for choking and stridor.   Gastrointestinal: Negative for vomiting and blood in stool.  Genitourinary: Negative for hematuria and decreased urine volume.  Musculoskeletal: Negative for acute joint swelling Skin: Negative for color change and wound.  Neurological: Negative for tremors and numbness other than noted  Psychiatric/Behavioral: Negative for decreased concentration or  hyperactivity.       Objective:   Physical Exam BP 110/78  Pulse 103  Temp(Src) 97.9 F (36.6 C) (Oral)  Ht 5\' 11"  (1.803 m)  Wt 128 lb (58.06 kg)  BMI  17.86 kg/m2  SpO2 97% VS noted, not ill appearing but uncomfortable sitting in wheelchair (normally more ambulatory); is able to stand but limps to put wt to the RLE Constitutional: Pt appears somewhat thin  HENT: Head: NCAT.  Right Ear: External ear normal.  Left Ear: External ear normal.  Eyes: Conjunctivae and EOM are normal. Pupils are equal, round, and reactive to light.  Neck: Normal range of motion. Neck supple.  Cardiovascular: Normal rate and regular rhythm.   Pulmonary/Chest: Effort normal and breath sounds decreased, no rales or wheezing.  Abd:  Soft, NT, non-distended, + BS Neurological: Pt is alert. Not confused , motor 5/5 LE's and UE's Skin: right posterior (and somewhat more lateral aspect) thigh with large elongated area (along the femur axis) estimated approx 12 x 4 cm of tender/erythema, firmness Psychiatric: Pt behavior is normal. Thought content normal. 1+ nervous    Assessment & Plan:

## 2012-10-10 NOTE — Assessment & Plan Note (Signed)
Has ongoing copd but exam ok, - ? Related to more symptomatic anemia given the possible hematoma? -, for labs today including haptoglobin and LDH, cbc/plts

## 2012-10-10 NOTE — Assessment & Plan Note (Addendum)
With post thigh swelling large but localized, suspect intramuscular hematoma, for pain control, MRI, consider ortho referral  Note:  Total time for pt hx, exam, review of record with pt in the room, determination of diagnoses and plan for further eval and tx is > 40 min, with over 50% spent in coordination and counseling of patient

## 2012-10-10 NOTE — Assessment & Plan Note (Signed)
Mild incidental, just mentions on exam today, suspect mild right CTS, for right wrist splint to start as exam without signficant findings, consider EMG/NCS and or hand surgury referral if persists or worsens

## 2012-10-10 NOTE — Assessment & Plan Note (Signed)
With erythema, cant r/po cellulitis, for antibx as well

## 2012-10-10 NOTE — Assessment & Plan Note (Signed)
stable overall by history and exam, recent data reviewed with pt, and pt to continue medical treatment as before,  to f/u any worsening symptoms or concerns SpO2 Readings from Last 3 Encounters:  10/10/12 97%  07/19/12 99%  07/19/12 99%

## 2012-10-11 ENCOUNTER — Inpatient Hospital Stay (HOSPITAL_COMMUNITY)
Admission: EM | Admit: 2012-10-11 | Discharge: 2012-10-13 | DRG: 312 | Disposition: A | Discharge status: Home / Self Care | Payer: Medicare Other | Attending: Internal Medicine | Admitting: Internal Medicine

## 2012-10-11 ENCOUNTER — Encounter (HOSPITAL_COMMUNITY): Payer: Self-pay | Admitting: Emergency Medicine

## 2012-10-11 ENCOUNTER — Emergency Department (HOSPITAL_COMMUNITY): Payer: Medicare Other

## 2012-10-11 ENCOUNTER — Encounter: Payer: Self-pay | Admitting: Internal Medicine

## 2012-10-11 DIAGNOSIS — I714 Abdominal aortic aneurysm, without rupture, unspecified: Secondary | ICD-10-CM

## 2012-10-11 DIAGNOSIS — D5919 Other autoimmune hemolytic anemia: Secondary | ICD-10-CM

## 2012-10-11 DIAGNOSIS — R55 Syncope and collapse: Secondary | ICD-10-CM

## 2012-10-11 DIAGNOSIS — N289 Disorder of kidney and ureter, unspecified: Secondary | ICD-10-CM

## 2012-10-11 DIAGNOSIS — Z95828 Presence of other vascular implants and grafts: Secondary | ICD-10-CM

## 2012-10-11 DIAGNOSIS — E0590 Thyrotoxicosis, unspecified without thyrotoxic crisis or storm: Secondary | ICD-10-CM

## 2012-10-11 DIAGNOSIS — R58 Hemorrhage, not elsewhere classified: Secondary | ICD-10-CM | POA: Diagnosis present

## 2012-10-11 DIAGNOSIS — D649 Anemia, unspecified: Secondary | ICD-10-CM

## 2012-10-11 DIAGNOSIS — E876 Hypokalemia: Secondary | ICD-10-CM

## 2012-10-11 DIAGNOSIS — M79651 Pain in right thigh: Secondary | ICD-10-CM | POA: Diagnosis present

## 2012-10-11 DIAGNOSIS — K635 Polyp of colon: Secondary | ICD-10-CM

## 2012-10-11 DIAGNOSIS — R001 Bradycardia, unspecified: Secondary | ICD-10-CM

## 2012-10-11 DIAGNOSIS — J4489 Other specified chronic obstructive pulmonary disease: Secondary | ICD-10-CM | POA: Diagnosis present

## 2012-10-11 DIAGNOSIS — R202 Paresthesia of skin: Secondary | ICD-10-CM

## 2012-10-11 DIAGNOSIS — J449 Chronic obstructive pulmonary disease, unspecified: Secondary | ICD-10-CM | POA: Diagnosis present

## 2012-10-11 DIAGNOSIS — L0291 Cutaneous abscess, unspecified: Secondary | ICD-10-CM

## 2012-10-11 DIAGNOSIS — E785 Hyperlipidemia, unspecified: Secondary | ICD-10-CM

## 2012-10-11 DIAGNOSIS — N183 Chronic kidney disease, stage 3 unspecified: Secondary | ICD-10-CM

## 2012-10-11 DIAGNOSIS — F17200 Nicotine dependence, unspecified, uncomplicated: Secondary | ICD-10-CM | POA: Diagnosis present

## 2012-10-11 DIAGNOSIS — Z0000 Encounter for general adult medical examination without abnormal findings: Secondary | ICD-10-CM

## 2012-10-11 DIAGNOSIS — R0600 Dyspnea, unspecified: Secondary | ICD-10-CM

## 2012-10-11 DIAGNOSIS — K219 Gastro-esophageal reflux disease without esophagitis: Secondary | ICD-10-CM | POA: Diagnosis present

## 2012-10-11 DIAGNOSIS — D696 Thrombocytopenia, unspecified: Secondary | ICD-10-CM

## 2012-10-11 DIAGNOSIS — I129 Hypertensive chronic kidney disease with stage 1 through stage 4 chronic kidney disease, or unspecified chronic kidney disease: Secondary | ICD-10-CM | POA: Diagnosis present

## 2012-10-11 DIAGNOSIS — N390 Urinary tract infection, site not specified: Secondary | ICD-10-CM

## 2012-10-11 DIAGNOSIS — S7011XA Contusion of right thigh, initial encounter: Secondary | ICD-10-CM

## 2012-10-11 DIAGNOSIS — Z79899 Other long term (current) drug therapy: Secondary | ICD-10-CM

## 2012-10-11 DIAGNOSIS — D509 Iron deficiency anemia, unspecified: Secondary | ICD-10-CM

## 2012-10-11 DIAGNOSIS — L02419 Cutaneous abscess of limb, unspecified: Secondary | ICD-10-CM | POA: Diagnosis present

## 2012-10-11 DIAGNOSIS — D72829 Elevated white blood cell count, unspecified: Secondary | ICD-10-CM

## 2012-10-11 DIAGNOSIS — I10 Essential (primary) hypertension: Secondary | ICD-10-CM

## 2012-10-11 DIAGNOSIS — I739 Peripheral vascular disease, unspecified: Secondary | ICD-10-CM

## 2012-10-11 DIAGNOSIS — M7989 Other specified soft tissue disorders: Secondary | ICD-10-CM

## 2012-10-11 DIAGNOSIS — I951 Orthostatic hypotension: Principal | ICD-10-CM | POA: Diagnosis present

## 2012-10-11 DIAGNOSIS — T148XXA Other injury of unspecified body region, initial encounter: Secondary | ICD-10-CM

## 2012-10-11 DIAGNOSIS — N179 Acute kidney failure, unspecified: Secondary | ICD-10-CM

## 2012-10-11 DIAGNOSIS — L0390 Cellulitis, unspecified: Secondary | ICD-10-CM

## 2012-10-11 DIAGNOSIS — S8011XA Contusion of right lower leg, initial encounter: Secondary | ICD-10-CM

## 2012-10-11 DIAGNOSIS — I2699 Other pulmonary embolism without acute cor pulmonale: Secondary | ICD-10-CM

## 2012-10-11 HISTORY — DX: Cellulitis, unspecified: L03.90

## 2012-10-11 HISTORY — DX: Pneumonia, unspecified organism: J18.9

## 2012-10-11 HISTORY — DX: Syncope and collapse: R55

## 2012-10-11 HISTORY — DX: Personal history of other medical treatment: Z92.89

## 2012-10-11 HISTORY — DX: Chronic kidney disease, stage 3 unspecified: N18.30

## 2012-10-11 HISTORY — DX: Chronic kidney disease, stage 3 (moderate): N18.3

## 2012-10-11 LAB — CBC WITH DIFFERENTIAL/PLATELET
Basophils Absolute: 0 10*3/uL (ref 0.0–0.1)
Lymphs Abs: 0.7 10*3/uL (ref 0.7–4.0)
MCV: 79.9 fL (ref 78.0–100.0)
Monocytes Relative: 5 % (ref 3–12)
Platelets: 81 10*3/uL — ABNORMAL LOW (ref 150–400)
RDW: 13.4 % (ref 11.5–15.5)
WBC Morphology: INCREASED
WBC: 9.6 10*3/uL (ref 4.0–10.5)

## 2012-10-11 LAB — HEPATIC FUNCTION PANEL
ALT: 9 U/L (ref 0–53)
AST: 21 U/L (ref 0–37)
Alkaline Phosphatase: 68 U/L (ref 39–117)
Bilirubin, Direct: 0.2 mg/dL (ref 0.0–0.3)
Total Bilirubin: 0.9 mg/dL (ref 0.3–1.2)

## 2012-10-11 LAB — BASIC METABOLIC PANEL
Calcium: 9.5 mg/dL (ref 8.4–10.5)
Chloride: 105 mEq/L (ref 96–112)
Creatinine, Ser: 2.34 mg/dL — ABNORMAL HIGH (ref 0.50–1.35)
GFR calc non Af Amer: 26 mL/min — ABNORMAL LOW (ref >90)
GFR: 37.29 mL/min — ABNORMAL LOW (ref >60.00)
Glucose, Bld: 126 mg/dL — ABNORMAL HIGH (ref 70–99)
Potassium: 4.5 mEq/L (ref 3.5–5.1)
Sodium: 138 mEq/L (ref 135–145)
Sodium: 136 mEq/L (ref 135–145)

## 2012-10-11 LAB — TROPONIN I: Troponin I: <0.3 ng/mL (ref <0.30)

## 2012-10-11 LAB — PROTIME-INR
INR: 1.15 (ref 0.00–1.49)
Prothrombin Time: 12 s (ref 10.2–12.4)

## 2012-10-11 LAB — RETICULOCYTES: RBC.: 3.23 MIL/uL — ABNORMAL LOW (ref 4.22–5.81)

## 2012-10-11 MED ORDER — ATORVASTATIN CALCIUM 10 MG PO TABS
10.0000 mg | ORAL_TABLET | Freq: Every day | ORAL | Status: DC
  Administered 2012-10-12 – 2012-10-13 (×2): 10 mg via ORAL
  Filled 2012-10-11 (×2): qty 1

## 2012-10-11 MED ORDER — FERROUS FUMARATE 325 (106 FE) MG PO TABS
106.0000 mg | ORAL_TABLET | Freq: Every day | ORAL | Status: DC
  Administered 2012-10-11 – 2012-10-13 (×3): 106 mg via ORAL
  Filled 2012-10-11 (×3): qty 1

## 2012-10-11 MED ORDER — CARBAMIDE PEROXIDE 6.5 % OT SOLN: 10.0000 [drp] | Freq: Two times a day (BID) | OTIC | Status: DC

## 2012-10-11 MED ORDER — SENNOSIDES-DOCUSATE SODIUM 8.6-50 MG PO TABS
1.0000 | ORAL_TABLET | Freq: Every evening | ORAL | Status: DC | PRN
  Filled 2012-10-11: qty 1

## 2012-10-11 MED ORDER — SODIUM CHLORIDE 0.9 % IJ SOLN: 3.0000 mL | Freq: Two times a day (BID) | INTRAMUSCULAR | Status: DC

## 2012-10-11 MED ORDER — AMLODIPINE BESYLATE 5 MG PO TABS
5.0000 mg | ORAL_TABLET | Freq: Every day | ORAL | Status: DC
  Filled 2012-10-11 (×3): qty 1

## 2012-10-11 MED ORDER — SODIUM CHLORIDE 0.9 % IV SOLN
Freq: Once | INTRAVENOUS | Status: AC
  Administered 2012-10-11: 18:00:00 via INTRAVENOUS

## 2012-10-11 MED ORDER — SODIUM CHLORIDE 0.9 % IV SOLN
INTRAVENOUS | Status: AC
  Administered 2012-10-11: 20:00:00 via INTRAVENOUS

## 2012-10-11 MED ORDER — CLINDAMYCIN PHOSPHATE 600 MG/50ML IV SOLN
600.0000 mg | Freq: Three times a day (TID) | INTRAVENOUS | Status: DC
  Administered 2012-10-11 – 2012-10-13 (×6): 600 mg via INTRAVENOUS
  Filled 2012-10-11 (×8): qty 50

## 2012-10-11 MED ORDER — ONDANSETRON HCL 4 MG PO TABS: 4.0000 mg | ORAL_TABLET | Freq: Four times a day (QID) | ORAL | Status: DC | PRN

## 2012-10-11 MED ORDER — HYDROCODONE-ACETAMINOPHEN 5-325 MG PO TABS: 1.0000 | ORAL_TABLET | Freq: Four times a day (QID) | ORAL | Status: DC | PRN

## 2012-10-11 MED ORDER — PANTOPRAZOLE SODIUM 40 MG PO TBEC
40.0000 mg | DELAYED_RELEASE_TABLET | Freq: Every day | ORAL | Status: DC
  Administered 2012-10-12 – 2012-10-13 (×2): 40 mg via ORAL
  Filled 2012-10-11 (×2): qty 1

## 2012-10-11 MED ORDER — ONDANSETRON HCL 4 MG/2ML IJ SOLN: 4.0000 mg | Freq: Four times a day (QID) | INTRAMUSCULAR | Status: DC | PRN

## 2012-10-11 NOTE — H&P (Addendum)
[facility] History and Physical  Alexandra Posadas ZOX:[id number] DOB: July 11, 1934 DOA: 10/11/2012  Referring physician: er PCP: Oliver Barre, MD  Specialists:   Chief Complaint: near syncope and thigh pain  HPI: [name] is a 78 y.o. male  Who was brought to the ER by ambulance after having a near-syncopal event at the bank this AM.  He was at the counter when he felt light headed, SOB and 'not quite right" .  He states he slumped over but never lost consciousness.  They brought him a chair and he sat down.  After that he began to feel better.  No LOC, no bowel or bladder incontinence.  No stroke like symptoms No CP, no fever, no chill.  He had not eaten all day.  He does not drink much water but does drink a lot of soda and tea.  Per EMS and nursing reports, he had positive orthostatic changes.    He saw his PCP yesterday about a pain in his thigh.  PCP ordered an MRI but he was unable to get that done.    Review of Systems: all systems reviewed, negative unless stated above    Past Medical History  Diagnosis Date  . AAA (abdominal aortic aneurysm)   . Thrombocytopenia   . Abdominal aneurysm without mention of rupture 04/23/2011  . Peripheral arterial disease 04/23/2011  . COPD (chronic obstructive pulmonary disease) 05/06/2011  . Shortness of breath   . Hyperlipidemia 08/30/2011  . Liver damage 1960's    Several liver bx due to elevated liver functions  . Hypertension   . GERD (gastroesophageal reflux disease)   . Anemia   . Iron deficiency anemia 05/26/2011  . Renal insufficiency 02/21/2012   Past Surgical History  Procedure Laterality Date  . Abdominal aortic aneurysm repair    . Pr vein bypass graft,aorto-fem-pop    . Esophagogastroduodenoscopy  06/10/2011    Procedure: ESOPHAGOGASTRODUODENOSCOPY (EGD);  Surgeon: Petra Kuba, MD;  Location: [facility] ENDOSCOPY;  Service: Endoscopy;  Laterality: N/A;  . Colonoscopy with propofol  07/19/2012    Procedure: COLONOSCOPY WITH  PROPOFOL;  Surgeon: Willis Modena, MD;  Location: [facility] ENDOSCOPY;  Service: Endoscopy;  Laterality: N/A;  polyp lifter needed   Social History:  reports that he has been smoking Cigarettes.  He has a 12.5 pack-year smoking history. He has never used smokeless tobacco. He reports that he does not drink alcohol or use illicit drugs.   Allergies  Allergen Reactions  . Levofloxacin Other (See Comments)    HEMOLYTIC ANEMIA    Family History  Problem Relation Age of Onset  . Cancer Mother     breast  . Hypertension Father   . Diabetes Sister   . COPD Brother    Prior to Admission medications   Medication Sig Start Date End Date Taking? Authorizing Provider  amLODipine (NORVASC) 5 MG tablet Take 5 mg by mouth daily before breakfast.   Yes Historical Provider, MD  carbamide peroxide (DEBROX) 6.5 % otic solution Place 10 drops into both ears 2 (two) times daily.   Yes Historical Provider, MD  cephALEXin (KEFLEX) 500 MG capsule Take 1 capsule (500 mg total) by mouth 4 (four) times daily. 10/10/12  Yes Corwin Levins, MD  ferrous fumarate (HEMOCYTE - 106 MG FE) 325 (106 FE) MG TABS Take 106 mg of iron by mouth daily.    Yes Historical Provider, MD  HYDROcodone-acetaminophen (NORCO/VICODIN) 5-325 MG per tablet Take 1 tablet by mouth every 6 (six) hours as  needed for pain. 10/10/12  Yes Corwin Levins, MD  pantoprazole (PROTONIX) 40 MG tablet Take 40 mg by mouth daily.   Yes Historical Provider, MD  atorvastatin (LIPITOR) 10 MG tablet Take 10 mg by mouth daily before breakfast.     Historical Provider, MD   Physical Exam: Filed Vitals:   10/11/12 1430 10/11/12 1500 10/11/12 1700 10/11/12 1730  BP: 141/94 156/80 137/87 171/87  Pulse: 68 49 68 59  Temp:      TempSrc:      Resp: 16 16 17 18   SpO2: 100% 99% 100% 100%     General:  A+Ox3, NAD  Eyes: wnl  ENT: wnl  Neck: supple  Cardiovascular: rrr  Respiratory: clear anterior  Abdomen: +Bs, soft, NT/ND  Skin: right posterior thigh has  redness and warmth- tender to touch- compartment soft- will monitor  Musculoskeletal: moves all 4 ext  Psychiatric: mood/affect normal  Neurologic: no focal deficit  Labs on Admission:  Basic Metabolic Panel:  Recent Labs Lab 10/10/12 1724 10/11/12 1316  NA 138 136  K 4.5 4.8  CL 105 101  CO2 25 23  GLUCOSE 115* 126*  BUN 25* 33*  CREATININE 2.2* 2.34*  CALCIUM 9.3 9.5   Liver Function Tests:  Recent Labs Lab 10/10/12 1724  AST 21  ALT 9  ALKPHOS 68  BILITOT 0.9  PROT 7.3  ALBUMIN 3.7   No results found for this basename: LIPASE, AMYLASE,  in the last 168 hours No results found for this basename: AMMONIA,  in the last 168 hours CBC:  Recent Labs Lab 10/10/12 1724 10/11/12 1316  WBC 8.2 9.6  NEUTROABS 6.2 8.4*  HGB 9.5* 9.5*  HCT 28.2* 26.3*  MCV 85.4 79.9  PLT 83.0 Repeated and verified X2.* 81*   Cardiac Enzymes: No results found for this basename: CKTOTAL, CKMB, CKMBINDEX, TROPONINI,  in the last 168 hours  BNP (last 3 results) No results found for this basename: PROBNP,  in the last 8760 hours CBG: No results found for this basename: GLUCAP,  in the last 168 hours  Radiological Exams on Admission: Ct Head Wo Contrast  10/11/2012  *RADIOLOGY REPORT*  Clinical Data: Right leg pain.  Syncope.  CT HEAD WITHOUT CONTRAST  Technique:  Contiguous axial images were obtained from the base of the skull through the vertex without contrast.  Comparison: None.  Findings: There is no evidence of acute intracranial abnormality including infarct, hemorrhage, mass lesion, mass effect, midline shift or abnormal extra-axial fluid collection.  There is some chronic microvascular ischemic change.  No pneumocephalus or hydrocephalus is identified.  The calvarium is intact.  IMPRESSION: No acute finding.   Original Report Authenticated By: Holley Dexter, M.D.    Mr Femur Right Wo Contrast  10/11/2012  *RADIOLOGY REPORT*  Clinical Data: Posterior thigh pain and swelling  for 2 days.  No known injury.  Acute renal failure.  MRI OF THE RIGHT THIGH WITHOUT CONTRAST  Technique:  Multiplanar, multisequence MR imaging of the right thigh was performed.  No intravenous contrast was administered due to renal failure.  Comparison:   None  Findings:  Study was performed utilizing the body coil. Both thighs are included on the axial and coronal images.  The images extend from the lower pelvis into the distal thighs.  The right biceps femoris muscle is diffusely enlarged and edematous.  Within the distal thigh, there is a "mass" within the biceps muscle which measures 4.9 x 6.6 cm transverse and 9.3 cm cephalocaudad.  This mass demonstrates minimal T1 hyperintensity. There is T2 heterogeneity with a rim of low signal.  The distal biceps tendon is incompletely visualized, but grossly intact without retraction.  In the proximal thigh, there is subcutaneous edema surrounding the hamstring tendons.  Some edema extends into the right hip adductor musculature.  The semimembranosus and semitendinosus muscles appear normal.  The quadriceps musculature appears normal.  No left thigh abnormalities are identified.  Neither femur demonstrates any significant abnormality.  There is no hip joint effusion.  There are postsurgical changes related to aortobifemoral bypass in both groins.  IMPRESSION:  1.  Intramuscular mass distally within the right biceps femoris muscle has some features suggesting that this could reflect a hematoma.  However, neoplasm (sarcoma) cannot be excluded.  If there is no clinical evidence of hematoma or risk factor for intramuscular bleeding, further evaluation with ultrasound guided biopsy or MR followup should be considered in this patient with renal failure who cannot receive intravenous gadolinium. 2.  Nonspecific edema throughout the right biceps and adductor musculature may reflect myositis.  There is overlying subcutaneous edema which may represent cellulitis. Clinical  correlation is necessary to exclude compartment syndrome. 3.  No evidence of osteomyelitis or acute osseous abnormality.   Original Report Authenticated By: Carey Bullocks, M.D.     EKG: Independently reviewed. 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 Near-syncope/orthostatic hypotension- asked ER nurse to repeat and record orthostatic, IVF ordered- cycle CE, echo- I believe his episode was from orthostatics but will place and monitor on tele 2. AKI on CKD- seems to be higher than baseline 12/13 was about 1.5- does not drink much water or urinate much- monitor 3. Thigh hematoma- MRI showed:  1. Intramuscular mass distally within the right biceps femoris  muscle has some features suggesting that this could reflect a  hematoma. However, neoplasm (sarcoma) cannot be excluded. If  there is no clinical evidence of hematoma or risk factor for  intramuscular bleeding, further evaluation with ultrasound guided  biopsy or MR followup should be considered in this patient with  renal failure who cannot receive intravenous gadolinium.  2. Nonspecific edema throughout the right biceps and adductor  musculature may reflect myositis. There is overlying subcutaneous  edema which may represent cellulitis. Clinical correlation is  necessary to exclude compartment syndrome.  3. No evidence of osteomyelitis or acute osseous abnormality. - ? If need evacuation by vascular/surgery- will see how it improves with abx, watch for fevers and compartment syndrome pulse good and anterior thigh is soft  4. Thrombocytopenia- ?etiology has seen Dr. Darrold Span in past for transfusions of platelets 5. Fe def anemia- has had  colonoscopy in recent past as part of the work up Dr. Darrold Span wants- check Fe. Panel- may need transfusion once IVF are given and patient has some dilution of his blood 6. Cellulitis- IV abx as leg is very painful and hot to touch     Code Status: full Family Communication: sister at bedside Disposition Plan: home 2-3 days  Time spent: 1  Marlin Canary [facility] Pager [phone]  If 7PM-7AM, please contact night-coverage [email or web address] Password [facility] 10/11/2012, 6:11 PM

## 2012-10-11 NOTE — ED Notes (Signed)
Pt returned from radiology.

## 2012-10-11 NOTE — ED Notes (Signed)
Lab at bedside

## 2012-10-11 NOTE — ED Notes (Signed)
Pt reports it was in the bank with he started to get a little SOB from walking in and then next thing he new the staff had call EMS. Pt denis LOC but cannot recall the entire incident. Pt in nad, skin warm and dry, resp e/u.

## 2012-10-11 NOTE — ED Notes (Signed)
Per EMs - pt had a near syncopal episode, he was at a bank when he started leaning to the side. Staff assisted him to a chair, once he was sitting down he said he felt much better, didn't want to come with EMS to ED. 12 lead showed some inverted t waves. Pt denies CP, pt c/o SOB but states that is normal for him. Pt denies LOC but doesn't completely remember the entire situation. Pt was told he has a hematoma in right leg, pt denies injury/trauma to that leg. Pt had positive orthostatic changes. Pt reports he has an MRI scheduled to look at his leg today. BP 150/84 HR 80-90s some PVCs CBG 140. EMS started a 22 G in left forearm.

## 2012-10-11 NOTE — Progress Notes (Signed)
ANTIBIOTIC CONSULT NOTE - INITIAL  Pharmacy Consult for Clindamycin Indication: cellulitis  Allergies  Allergen Reactions  . Levofloxacin Other (See Comments)    HEMOLYTIC ANEMIA    Patient Measurements:   Adjusted Body Weight: 58.1 kg  Vital Signs: Temp: 97.4 F (36.3 C) (04/16 1248) Temp src: Oral (04/16 1248) BP: 171/87 mmHg (04/16 1730) Pulse Rate: 59 (04/16 1730) Intake/Output from previous day:   Intake/Output from this shift:    Labs:  Recent Labs  10/10/12 1724 10/11/12 1316  WBC 8.2 9.6  HGB 9.5* 9.5*  PLT 83.0 Repeated and verified X2.* 81*  CREATININE 2.2* 2.34*   The CrCl is unknown because both a height and weight (above a minimum accepted value) are required for this calculation. No results found for this basename: VANCOTROUGH, VANCOPEAK, VANCORANDOM, GENTTROUGH, GENTPEAK, GENTRANDOM, TOBRATROUGH, TOBRAPEAK, TOBRARND, AMIKACINPEAK, AMIKACINTROU, AMIKACIN,  in the last 72 hours   Microbiology: No results found for this or any previous visit (from the past 720 hour(s)).  Medical History: Past Medical History  Diagnosis Date  . AAA (abdominal aortic aneurysm)   . Thrombocytopenia   . Abdominal aneurysm without mention of rupture 04/23/2011  . Peripheral arterial disease 04/23/2011  . COPD (chronic obstructive pulmonary disease) 05/06/2011  . Shortness of breath   . Hyperlipidemia 08/30/2011  . Liver damage 1960's    Several liver bx due to elevated liver functions  . Hypertension   . GERD (gastroesophageal reflux disease)   . Anemia   . Iron deficiency anemia 05/26/2011  . Renal insufficiency 02/21/2012    Medications:  Scheduled:  . [COMPLETED] sodium chloride   Intravenous Once  . sodium chloride   Intravenous STAT  . [START ON 10/12/2012] amLODipine  5 mg Oral QAC breakfast  . [START ON 10/12/2012] atorvastatin  10 mg Oral QAC breakfast  . carbamide peroxide  10 drop Both Ears BID  . ferrous fumarate  106 mg of iron Oral Daily  .  pantoprazole  40 mg Oral Daily  . sodium chloride  3 mL Intravenous Q12H   Assessment: 78 yo male admitted with near syncope and thigh pain.  Complicated medical history including AAA, PAD, COPD, hyperlipidemia, liver dx, HTN, GERD, anemia, thrombocytopenia and CRI.  Pharmacy asked to begin empiric clindamycin for thigh hematoma, intramuscular mass, possible cellulitis.  Scr 2.34, CrCl ~ 30 ml/min  Goal of Therapy:  Resolution of infection  Plan:  1. Clindamycin 600 mg IV q 8 hrs. 2. F/U plans for LOT.  Tad Moore, BCPS  Clinical Pharmacist Pager [phone]  10/11/2012 7:00 PM

## 2012-10-11 NOTE — ED Provider Notes (Signed)
History     CSN: [id number]  Arrival date & time 10/11/12  1237   First MD Initiated Contact with Patient 10/11/12 1259      Chief Complaint  Patient presents with  . Loss of Consciousness    (Consider location/radiation/quality/duration/timing/severity/associated sxs/prior treatment) Patient is a 78 y.o. male presenting with syncope. The history is provided by the patient and the EMS personnel.  Loss of Consciousness   He was brought by embolus after having a near syncopal episode. He states that he went to have this checked again in to. They'll and the daughter told him that he just didn't look well. He states that he had been having some pain in his right leg for which he had seen his PCP yesterday and was scheduled for an MRI today. EMS reports a near syncopal episode but patient does not remember that. He does not remember anything between when the talar told him that he didn't look well and when the ambulance arrived. He denied chest pain, heaviness, tightness, pressure. He has no change in his baseline dyspnea. There was no nausea or vomiting or diaphoresis. His right eye has been painful when standing. He first noticed that about 3 days ago and saw his PCP yesterday who ordered an MRI today to see if it was a hematoma or infection. His right leg is only painful if he is standing on it and when it has been on it, the pain is rated at 8/10.  Past Medical History  Diagnosis Date  . AAA (abdominal aortic aneurysm)   . Thrombocytopenia   . Abdominal aneurysm without mention of rupture 04/23/2011  . Peripheral arterial disease 04/23/2011  . COPD (chronic obstructive pulmonary disease) 05/06/2011  . Shortness of breath   . Hyperlipidemia 08/30/2011  . Liver damage 1960's    Several liver bx due to elevated liver functions  . Hypertension   . GERD (gastroesophageal reflux disease)   . Anemia   . Iron deficiency anemia 05/26/2011  . Renal insufficiency 02/21/2012    Past Surgical  History  Procedure Laterality Date  . Abdominal aortic aneurysm repair    . Pr vein bypass graft,aorto-fem-pop    . Esophagogastroduodenoscopy  06/10/2011    Procedure: ESOPHAGOGASTRODUODENOSCOPY (EGD);  Surgeon: Petra Kuba, MD;  Location: [facility] ENDOSCOPY;  Service: Endoscopy;  Laterality: N/A;  . Colonoscopy with propofol  07/19/2012    Procedure: COLONOSCOPY WITH PROPOFOL;  Surgeon: Willis Modena, MD;  Location: [facility] ENDOSCOPY;  Service: Endoscopy;  Laterality: N/A;  polyp lifter needed    Family History  Problem Relation Age of Onset  . Cancer Mother     breast  . Hypertension Father   . Diabetes Sister   . COPD Brother     History  Substance Use Topics  . Smoking status: Current Every Day Smoker -- 0.25 packs/day for 50 years    Types: Cigarettes    Last Attempt to Quit: 03/30/2011  . Smokeless tobacco: Never Used  . Alcohol Use: No     Comment: Quit 20 years ago. Was a heavy drinker in his 20s-30s      Review of Systems  Cardiovascular: Positive for syncope.  All other systems reviewed and are negative.    Allergies  Levofloxacin  Home Medications   Current Outpatient Rx  Name  Route  Sig  Dispense  Refill  . amLODipine (NORVASC) 5 MG tablet   Oral   Take 5 mg by mouth daily before breakfast.         .  atorvastatin (LIPITOR) 10 MG tablet   Oral   Take 10 mg by mouth daily before breakfast.          . cephALEXin (KEFLEX) 500 MG capsule   Oral   Take 1 capsule (500 mg total) by mouth 4 (four) times daily.   40 capsule   0   . ferrous fumarate (HEMOCYTE - 106 MG FE) 325 (106 FE) MG TABS   Oral   Take 1 tablet by mouth.         . folic acid (FOLVITE) 1 MG tablet   Oral   Take 1 mg by mouth daily.         Marland Kitchen HYDROcodone-acetaminophen (NORCO/VICODIN) 5-325 MG per tablet   Oral   Take 1 tablet by mouth every 6 (six) hours as needed for pain.   120 tablet   0   . pantoprazole (PROTONIX) 40 MG tablet   Oral   Take 40 mg by mouth daily.            BP 155/105  Temp(Src) 97.4 F (36.3 C) (Oral)  Resp 21  SpO2 100%  Physical Exam  Nursing note and vitals reviewed.  78 year old male, resting comfortably and in no acute distress. Vital signs are significant for hypertension with blood pressure 155/105, mild tachypnea with respiratory rate of 21. Oxygen saturation is 100%, which is normal. Head is normocephalic and atraumatic. PERRLA, EOMI. Oropharynx is clear. Neck is nontender and supple without adenopathy or JVD. There are no carotid bruits. Back is nontender and there is no CVA tenderness. Lungs are clear without rales, wheezes, or rhonchi. Chest is nontender. Heart has regular rate and rhythm without murmur. Abdomen is soft, flat, nontender without masses or hepatosplenomegaly and peristalsis is normoactive. Extremities have no cyanosis or edema, full range of motion is present. Right posterior distal thigh has an area of induration and tenderness. There is no erythema the overlying skin. Skin is warm and dry without rash. Neurologic: Mental status is normal, cranial nerves are intact, there are no motor or sensory deficits.  ED Course  Procedures (including critical care time)  Results for orders placed during the hospital encounter of 10/11/12  CBC WITH DIFFERENTIAL      Result Value Range   WBC 9.6  4.0 - 10.5 K/uL   RBC 3.29 (*) 4.22 - 5.81 MIL/uL   Hemoglobin 9.5 (*) 13.0 - 17.0 g/dL   HCT 16.1 (*) 09.6 - 04.5 %   MCV 79.9  78.0 - 100.0 fL   MCH 28.9  26.0 - 34.0 pg   MCHC 36.1 (*) 30.0 - 36.0 g/dL   RDW 40.9  81.1 - 91.4 %   Platelets 81 (*) 150 - 400 K/uL   Neutrophils Relative 88 (*) 43 - 77 %   Lymphocytes Relative 7 (*) 12 - 46 %   Monocytes Relative 5  3 - 12 %   Eosinophils Relative 0  0 - 5 %   Basophils Relative 0  0 - 1 %   Neutro Abs 8.4 (*) 1.7 - 7.7 K/uL   Lymphs Abs 0.7  0.7 - 4.0 K/uL   Monocytes Absolute 0.5  0.1 - 1.0 K/uL   Eosinophils Absolute 0.0  0.0 - 0.7 K/uL   Basophils Absolute  0.0  0.0 - 0.1 K/uL   WBC Morphology INCREASED BANDS (>20% BANDS)     Smear Review LARGE PLATELETS PRESENT    BASIC METABOLIC PANEL      Result Value  Range   Sodium 136  135 - 145 mEq/L   Potassium 4.8  3.5 - 5.1 mEq/L   Chloride 101  96 - 112 mEq/L   CO2 23  19 - 32 mEq/L   Glucose, Bld 126 (*) 70 - 99 mg/dL   BUN 33 (*) 6 - 23 mg/dL   Creatinine, Ser 1.61 (*) 0.50 - 1.35 mg/dL   Calcium 9.5  8.4 - 09.6 mg/dL   GFR calc non Af Amer 26 (*) >90 mL/min   GFR calc Af Amer 30 (*) >90 mL/min  PROTIME-INR      Result Value Range   Prothrombin Time 14.5  11.6 - 15.2 seconds   INR 1.15  0.00 - 1.49  APTT      Result Value Range   aPTT 34  24 - 37 seconds  POCT I-STAT TROPONIN I      Result Value Range   Troponin i, poc 0.01  0.00 - 0.08 ng/mL   Comment 3            Ct Head Wo Contrast  10/11/2012  *RADIOLOGY REPORT*  Clinical Data: Right leg pain.  Syncope.  CT HEAD WITHOUT CONTRAST  Technique:  Contiguous axial images were obtained from the base of the skull through the vertex without contrast.  Comparison: None.  Findings: There is no evidence of acute intracranial abnormality including infarct, hemorrhage, mass lesion, mass effect, midline shift or abnormal extra-axial fluid collection.  There is some chronic microvascular ischemic change.  No pneumocephalus or hydrocephalus is identified.  The calvarium is intact.  IMPRESSION: No acute finding.   Original Report Authenticated By: Holley Dexter, M.D.    Mr Femur Right Wo Contrast  10/11/2012  *RADIOLOGY REPORT*  Clinical Data: Posterior thigh pain and swelling for 2 days.  No known injury.  Acute renal failure.  MRI OF THE RIGHT THIGH WITHOUT CONTRAST  Technique:  Multiplanar, multisequence MR imaging of the right thigh was performed.  No intravenous contrast was administered due to renal failure.  Comparison:   None  Findings:  Study was performed utilizing the body coil. Both thighs are included on the axial and coronal images.  The  images extend from the lower pelvis into the distal thighs.  The right biceps femoris muscle is diffusely enlarged and edematous.  Within the distal thigh, there is a "mass" within the biceps muscle which measures 4.9 x 6.6 cm transverse and 9.3 cm cephalocaudad.  This mass demonstrates minimal T1 hyperintensity. There is T2 heterogeneity with a rim of low signal.  The distal biceps tendon is incompletely visualized, but grossly intact without retraction.  In the proximal thigh, there is subcutaneous edema surrounding the hamstring tendons.  Some edema extends into the right hip adductor musculature.  The semimembranosus and semitendinosus muscles appear normal.  The quadriceps musculature appears normal.  No left thigh abnormalities are identified.  Neither femur demonstrates any significant abnormality.  There is no hip joint effusion.  There are postsurgical changes related to aortobifemoral bypass in both groins.  IMPRESSION:  1.  Intramuscular mass distally within the right biceps femoris muscle has some features suggesting that this could reflect a hematoma.  However, neoplasm (sarcoma) cannot be excluded.  If there is no clinical evidence of hematoma or risk factor for intramuscular bleeding, further evaluation with ultrasound guided biopsy or MR followup should be considered in this patient with renal failure who cannot receive intravenous gadolinium. 2.  Nonspecific edema throughout the right biceps and adductor musculature  may reflect myositis.  There is overlying subcutaneous edema which may represent cellulitis. Clinical correlation is necessary to exclude compartment syndrome. 3.  No evidence of osteomyelitis or acute osseous abnormality.   Original Report Authenticated By: Carey Bullocks, M.D.       Date: 10/11/2012  Rate: 83  Rhythm: SA node Wenkebach Block  QRS Axis: normal  Intervals: normal  ST/T Wave abnormalities: normal  Conduction Disutrbances:SA node Wenkebach Block  Narrative  Interpretation: Sinus node Wenckebach block, left ventricular hypertrophy. When compared with ECG of 04/29/2012, no significant changes are seen.  Old EKG Reviewed: unchanged    1. Near syncope   2. Thrombocytopenia   3. Anemia   4. Renal insufficiency   5. Hematoma of thigh, right, initial encounter       MDM  Near syncopal episode of uncertain cause. His lack of memory of the incident is worrisome. Old records are reviewed and he is being managed for a long standing thrombocytopenia of uncertain cause. He has had a spontaneous hemorrhage in the past. Given this history, and he will need a head CT to make sure that he did not have a spontaneous intracerebral bleed. MRI of his thigh had been ordered as an outpatient and will be obtained today. Also, his ECG appears to show a sinus node Wenckebach block. If this progresses to a higher degree of sinus node exit block, it could also account for her syncope and syncope. Yesterday, hemoglobin was 9.5, and platelet count was 83.  MRI is positive for probable hematoma in the thigh. Anemia is little changed from baseline. He he is noted to have had a bump in his creatinine from 4 months ago from about 1.6-2.2 and a slight increase from yesterday from 2.2-2.34. I am concerned about his near syncope without his having memory of the event. He'll be admitted for cardiac monitoring. Case is discussed with Dr. Benjamine Mola of [facility] who agrees to admit him under observation status.     Dione Booze, MD 10/11/12 [phone]

## 2012-10-12 DIAGNOSIS — D696 Thrombocytopenia, unspecified: Secondary | ICD-10-CM

## 2012-10-12 DIAGNOSIS — I517 Cardiomegaly: Secondary | ICD-10-CM

## 2012-10-12 LAB — TROPONIN I: Troponin I: <0.3 ng/mL (ref <0.30)

## 2012-10-12 LAB — BASIC METABOLIC PANEL
BUN: 36 mg/dL — ABNORMAL HIGH (ref 6–23)
CO2: 23 mEq/L (ref 19–32)
Chloride: 107 mEq/L (ref 96–112)
Creatinine, Ser: 2.1 mg/dL — ABNORMAL HIGH (ref 0.50–1.35)
GFR calc Af Amer: 34 mL/min — ABNORMAL LOW (ref >90)
Potassium: 4.2 mEq/L (ref 3.5–5.1)

## 2012-10-12 LAB — CBC
MCV: 78.7 fL (ref 78.0–100.0)
RDW: 13.5 % (ref 11.5–15.5)

## 2012-10-12 LAB — IRON AND TIBC
Saturation Ratios: 9 % — ABNORMAL LOW (ref 20–55)
TIBC: 181 ug/dL — ABNORMAL LOW (ref 215–435)
UIBC: 164 ug/dL (ref 125–400)

## 2012-10-12 LAB — PREPARE RBC (CROSSMATCH)

## 2012-10-12 MED ORDER — SODIUM CHLORIDE 0.9 % IV SOLN
INTRAVENOUS | Status: DC
  Administered 2012-10-12: 10:00:00 via INTRAVENOUS

## 2012-10-12 MED ORDER — ENSURE COMPLETE PO LIQD
237.0000 mL | Freq: Every day | ORAL | Status: DC
  Administered 2012-10-12 – 2012-10-13 (×2): 237 mL via ORAL

## 2012-10-12 NOTE — Progress Notes (Addendum)
INITIAL NUTRITION ASSESSMENT  DOCUMENTATION CODES Per approved criteria  -Underweight   INTERVENTION:  Ensure Complete daily (350 kcals, 13 gm protein per 8 fl oz bottle) RD to follow for nutrition care plan  NUTRITION DIAGNOSIS: Inadequate oral intake related to limited appetite as evidenced by PO intake 40-100%  Goal: Oral intake with meals & supplements to meet >/= 90% of estimated nutrition needs  Monitor:  PO & supplemental intake, weight, labs, I/O's  Reason for Assessment: Malnutrition Screening Tool Report  78 y.o. male  Admitting Dx: Near syncope  ASSESSMENT: Patient brought to ER by ambulance after having a near-syncopal event at the bank this AM; PO intake variable at 40-100% per flowsheet records; his weight has been stable, however, patient meets criteria for underweight state; patient reports he used to drink Ensure; would like during hospitalization ---> RD to order.  Height: Ht Readings from Last 1 Encounters:  10/10/12 5\' 11"  (1.803 m)    Weight: Wt Readings from Last 1 Encounters:  10/10/12 128 lb (58.06 kg)    Ideal Body Weight: 172 lb  % Ideal Body Weight: 74%  Wt Readings from Last 10 Encounters:  10/10/12 128 lb (58.06 kg)  08/08/12 129 lb 9.6 oz (58.786 kg)  07/19/12 130 lb (58.968 kg)  07/19/12 130 lb (58.968 kg)  06/05/12 134 lb 2 oz (60.839 kg)  05/30/12 133 lb (60.328 kg)  05/12/12 130 lb (58.968 kg)  04/29/12 130 lb (58.968 kg)  04/28/12 130 lb 12.8 oz (59.33 kg)  04/11/12 128 lb 8 oz (58.287 kg)    Usual Body Weight: 130 lb  % Usual Body Weight: 98%  BMI:  17.9 kg/m2  Estimated Nutritional Needs: Kcal: 1700-1900 Protein: 80-90 gm Fluid: 1.7-1.9 L  Skin: Intact  Diet Order: Cardiac  EDUCATION NEEDS: -No education needs identified at this time   Intake/Output Summary (Last 24 hours) at 10/12/12 1252 Last data filed at 10/12/12 1156  Gross per 24 hour  Intake    240 ml  Output   1550 ml  Net  -1310 ml    Last  BM: 4/15  Labs:   Recent Labs Lab 10/10/12 1724 10/11/12 1316 10/12/12 0208  NA 138 136 137  K 4.5 4.8 4.2  CL 105 101 107  CO2 25 23 23   BUN 25* 33* 36*  CREATININE 2.2* 2.34* 2.10*  CALCIUM 9.3 9.5 8.2*  GLUCOSE 115* 126* 93    Scheduled Meds: . atorvastatin  10 mg Oral q1800  . clindamycin (CLEOCIN) IV  600 mg Intravenous Q8H  . ferrous fumarate  106 mg of iron Oral Daily  . pantoprazole  40 mg Oral Q1200  . sodium chloride  3 mL Intravenous Q12H    Continuous Infusions: . sodium chloride 100 mL/hr at 10/12/12 1610    Past Medical History  Diagnosis Date  . Thrombocytopenia   . COPD (chronic obstructive pulmonary disease) 05/06/2011  . Hyperlipidemia 08/30/2011  . Liver damage 1960's    Several liver bx due to elevated liver functions  . Hypertension   . GERD (gastroesophageal reflux disease)   . Anemia   . Iron deficiency anemia 05/26/2011  . Cellulitis 2013; 10/11/2012    "LLE; RLE" (10/11/2012)  . AAA (abdominal aortic aneurysm)   . Abdominal aneurysm without mention of rupture 04/23/2011  . Peripheral arterial disease 04/23/2011  . Pneumonia     "once a few years ago" (10/11/2012)  . Shortness of breath     "just today" (10/11/2012)  . History of  blood transfusion 2013    "related to LLE hematoma" (10/11/2012)  . Stage III chronic kidney disease     /notes 10/11/2012  . Syncope and collapse 10/11/2012    Past Surgical History  Procedure Laterality Date  . Abdominal aortic aneurysm repair  2013  . Pr vein bypass graft,aorto-fem-pop    . Esophagogastroduodenoscopy  06/10/2011    Procedure: ESOPHAGOGASTRODUODENOSCOPY (EGD);  Surgeon: Petra Kuba, MD;  Location: [facility] ENDOSCOPY;  Service: Endoscopy;  Laterality: N/A;  . Colonoscopy with propofol  07/19/2012    Procedure: COLONOSCOPY WITH PROPOFOL;  Surgeon: Willis Modena, MD;  Location: [facility] ENDOSCOPY;  Service: Endoscopy;  Laterality: N/A;  polyp lifter needed    Maureen Chatters, RD, LDN Pager #:  [phone] After-Hours Pager #: [phone]

## 2012-10-12 NOTE — Progress Notes (Signed)
Utilization review completed. Amillia Biffle, RN, BSN. 

## 2012-10-12 NOTE — Progress Notes (Signed)
[facility] PROGRESS NOTE  Assessment/Plan:   *Near syncope: - Orthostatic positive. - cont IV fluids. Strict I and O's. - b-met in am - basleine Cr. 1.4, previous ECho WNL. ECHO on 4.17.2014 pending.  AKI: - most likely pre-renal cont IV fluids. - Baseline Cr 1.4. Trending down.   Hematoma/ Right thigh pain: - on SCD's. Has thrombocytopenia. - Hbg drop from 9.5 to 7.2, baseline 10.3. - It is going to drop. - will go ahead and transfuses. Cbc post-transfusion.   Cellulitis: - cont clindamycin.   Chronic  Thrombocytopenia: - follow up by Dr. Dorie Rank. - Plt's decreasing. He has had transfusion of platelets for colonoscopy. I will go ahead an transfuse.   Iron deficiency anemia - cont IV Iron. - cont Iron therapy orally.    Code Status: full Family Communication: wife  Disposition Plan: home 2-3 days.   Consultants:  none  Procedures:  ECHO  Antibiotics:  Clindamycin  HPI/Subjective: No complains. He relates he does note remember a fall  Objective: Filed Vitals:   10/11/12 2207 10/12/12 0000 10/12/12 0400 10/12/12 0802  BP: 64/37 102/60 114/64 117/66  Pulse: 93 57 56 59  Temp:  98.9 F (37.2 C) 99 F (37.2 C) 98.3 F (36.8 C)  TempSrc:    Oral  Resp:  18 18 18   SpO2:  99% 95% 98%    Intake/Output Summary (Last 24 hours) at 10/12/12 1017 Last data filed at 10/12/12 0900  Gross per 24 hour  Intake    240 ml  Output   1100 ml  Net   -860 ml   There were no vitals filed for this visit.  Exam:  General: Alert, awake, oriented x3, in no acute distress.  HEENT: No bruits, no goiter.  Heart: Regular rate and rhythm, without murmurs, rubs, gallops.  Lungs: Good air movement, bilateral air movement.  Abdomen: Soft, nontender, nondistended, positive bowel sounds.  Neuro: Grossly intact, nonfocal.   Data Reviewed: Basic Metabolic Panel:  Recent Labs Lab 10/10/12 1724 10/11/12 1316 10/12/12 0208  NA 138 136 137  K 4.5 4.8 4.2  CL  105 101 107  CO2 25 23 23   GLUCOSE 115* 126* 93  BUN 25* 33* 36*  CREATININE 2.2* 2.34* 2.10*  CALCIUM 9.3 9.5 8.2*   Liver Function Tests:  Recent Labs Lab 10/10/12 1724  AST 21  ALT 9  ALKPHOS 68  BILITOT 0.9  PROT 7.3  ALBUMIN 3.7   No results found for this basename: LIPASE, AMYLASE,  in the last 168 hours No results found for this basename: AMMONIA,  in the last 168 hours CBC:  Recent Labs Lab 10/10/12 1724 10/11/12 1316 10/12/12 0208  WBC 8.2 9.6 6.0  NEUTROABS 6.2 8.4*  --   HGB 9.5* 9.5* 7.2*  HCT 28.2* 26.3* 20.0*  MCV 85.4 79.9 78.7  PLT 83.0 Repeated and verified X2.* 81* 76*   Cardiac Enzymes:  Recent Labs Lab 10/11/12 1952 10/12/12 0208 10/12/12 0752  TROPONINI <0.30 <0.30 <0.30   BNP (last 3 results) No results found for this basename: PROBNP,  in the last 8760 hours CBG: No results found for this basename: GLUCAP,  in the last 168 hours  No results found for this or any previous visit (from the past 240 hour(s)).   Studies: Ct Head Wo Contrast  10/11/2012  *RADIOLOGY REPORT*  Clinical Data: Right leg pain.  Syncope.  CT HEAD WITHOUT CONTRAST  Technique:  Contiguous axial images were obtained from the base of the  skull through the vertex without contrast.  Comparison: None.  Findings: There is no evidence of acute intracranial abnormality including infarct, hemorrhage, mass lesion, mass effect, midline shift or abnormal extra-axial fluid collection.  There is some chronic microvascular ischemic change.  No pneumocephalus or hydrocephalus is identified.  The calvarium is intact.  IMPRESSION: No acute finding.   Original Report Authenticated By: Holley Dexter, M.D.    Mr Femur Right Wo Contrast  10/11/2012  *RADIOLOGY REPORT*  Clinical Data: Posterior thigh pain and swelling for 2 days.  No known injury.  Acute renal failure.  MRI OF THE RIGHT THIGH WITHOUT CONTRAST  Technique:  Multiplanar, multisequence MR imaging of the right thigh was  performed.  No intravenous contrast was administered due to renal failure.  Comparison:   None  Findings:  Study was performed utilizing the body coil. Both thighs are included on the axial and coronal images.  The images extend from the lower pelvis into the distal thighs.  The right biceps femoris muscle is diffusely enlarged and edematous.  Within the distal thigh, there is a "mass" within the biceps muscle which measures 4.9 x 6.6 cm transverse and 9.3 cm cephalocaudad.  This mass demonstrates minimal T1 hyperintensity. There is T2 heterogeneity with a rim of low signal.  The distal biceps tendon is incompletely visualized, but grossly intact without retraction.  In the proximal thigh, there is subcutaneous edema surrounding the hamstring tendons.  Some edema extends into the right hip adductor musculature.  The semimembranosus and semitendinosus muscles appear normal.  The quadriceps musculature appears normal.  No left thigh abnormalities are identified.  Neither femur demonstrates any significant abnormality.  There is no hip joint effusion.  There are postsurgical changes related to aortobifemoral bypass in both groins.  IMPRESSION:  1.  Intramuscular mass distally within the right biceps femoris muscle has some features suggesting that this could reflect a hematoma.  However, neoplasm (sarcoma) cannot be excluded.  If there is no clinical evidence of hematoma or risk factor for intramuscular bleeding, further evaluation with ultrasound guided biopsy or MR followup should be considered in this patient with renal failure who cannot receive intravenous gadolinium. 2.  Nonspecific edema throughout the right biceps and adductor musculature may reflect myositis.  There is overlying subcutaneous edema which may represent cellulitis. Clinical correlation is necessary to exclude compartment syndrome. 3.  No evidence of osteomyelitis or acute osseous abnormality.   Original Report Authenticated By: Carey Bullocks,  M.D.     Scheduled Meds: . atorvastatin  10 mg Oral q1800  . clindamycin (CLEOCIN) IV  600 mg Intravenous Q8H  . ferrous fumarate  106 mg of iron Oral Daily  . pantoprazole  40 mg Oral Q1200  . sodium chloride  3 mL Intravenous Q12H   Continuous Infusions: . sodium chloride 100 mL/hr at 10/12/12 0811     Marinda Elk  [facility] Pager [phone]. If 8PM-8AM, please contact night-coverage at [email or web address], password [facility] 10/12/2012, 10:17 AM  LOS: 1 day

## 2012-10-12 NOTE — Progress Notes (Signed)
  Echocardiogram 2D Echocardiogram has been performed.  [name] 10/12/2012, 10:32 AM

## 2012-10-13 DIAGNOSIS — S8010XA Contusion of unspecified lower leg, initial encounter: Secondary | ICD-10-CM

## 2012-10-13 LAB — CBC
HCT: 23.9 % — ABNORMAL LOW (ref 39.0–52.0)
HCT: 24.2 % — ABNORMAL LOW (ref 39.0–52.0)
MCH: 28.9 pg (ref 26.0–34.0)
MCHC: 36 g/dL (ref 30.0–36.0)
MCHC: 36 g/dL (ref 30.0–36.0)
MCV: 80.2 fL (ref 78.0–100.0)
Platelets: 167 10*3/uL (ref 150–400)
Platelets: 144 10*3/uL — ABNORMAL LOW (ref 150–400)
RDW: 13.7 % (ref 11.5–15.5)
RDW: 14.3 % (ref 11.5–15.5)
RDW: 14.5 % (ref 11.5–15.5)
WBC: 6.1 10*3/uL (ref 4.0–10.5)

## 2012-10-13 LAB — PREPARE PLATELET PHERESIS: Unit division: 0

## 2012-10-13 LAB — BASIC METABOLIC PANEL
BUN: 23 mg/dL (ref 6–23)
Calcium: 8.7 mg/dL (ref 8.4–10.5)
Creatinine, Ser: 1.56 mg/dL — ABNORMAL HIGH (ref 0.50–1.35)
GFR calc Af Amer: 48 mL/min — ABNORMAL LOW (ref >90)
GFR calc non Af Amer: 42 mL/min — ABNORMAL LOW (ref >90)

## 2012-10-13 MED ORDER — CLINDAMYCIN HCL 300 MG PO CAPS: 300.0000 mg | ORAL_CAPSULE | Freq: Three times a day (TID) | ORAL | Status: DC

## 2012-10-13 NOTE — Progress Notes (Signed)
[facility] PROGRESS NOTE  Assessment/Plan:  Near syncope: - Orthostatic resolved. - cont IV fluids. Strict I and O's. - b-met in pending. - basleine Cr. 1.4, previous ECho WNL. ECHO on 4.17.2014 pending.  AKI: - most likely pre-renal cont IV fluids. - Baseline Cr 1.4. Trending down.   Hematoma/ Right thigh pain: - on SCD's. Has thrombocytopenia. - Hbg drop from 9.5 to 7.2, s/p 2 unit transfusion hbg remained at 7.6, transfuse additional unit.   Cellulitis: - cont clindamycin. Change to orals.   Chronic  Thrombocytopenia: - follow up by Dr. Dorie Rank. - Plt's decreasing. He has had transfusion of platelets for colonoscopy. I will go ahead an transfuse.   Iron deficiency anemia - cont IV Iron. - cont Iron therapy orally.    Code Status: full Family Communication: wife  Disposition Plan: home 2-3 days.   Consultants:  none  Procedures:  ECHO  Antibiotics:  Clindamycin  HPI/Subjective: No complains.   Objective: Filed Vitals:   10/13/12 0403 10/13/12 0406 10/13/12 0409 10/13/12 0700  BP: 87/59 94/60 83/58  126/83  Pulse: 71 80 83 57  Temp:    98.2 F (36.8 C)  TempSrc:    Oral  Resp: 18 20 20 18   Height:      Weight:      SpO2: 96% 94% 95% 97%    Intake/Output Summary (Last 24 hours) at 10/13/12 1030 Last data filed at 10/13/12 0800  Gross per 24 hour  Intake  637.5 ml  Output   2425 ml  Net -1787.5 ml   Filed Weights   10/12/12 1448  Weight: 58.06 kg (128 lb)    Exam:  General: Alert, awake, oriented x3, in no acute distress.  HEENT: No bruits, no goiter.  Heart: Regular rate and rhythm, without murmurs, rubs, gallops.  Lungs: Good air movement, bilateral air movement.  Abdomen: Soft, nontender, nondistended, positive bowel sounds.  Neuro: Grossly intact, nonfocal.   Data Reviewed: Basic Metabolic Panel:  Recent Labs Lab 10/10/12 1724 10/11/12 1316 10/12/12 0208  NA 138 136 137  K 4.5 4.8 4.2  CL 105 101 107  CO2 25  23 23   GLUCOSE 115* 126* 93  BUN 25* 33* 36*  CREATININE 2.2* 2.34* 2.10*  CALCIUM 9.3 9.5 8.2*   Liver Function Tests:  Recent Labs Lab 10/10/12 1724  AST 21  ALT 9  ALKPHOS 68  BILITOT 0.9  PROT 7.3  ALBUMIN 3.7   No results found for this basename: LIPASE, AMYLASE,  in the last 168 hours No results found for this basename: AMMONIA,  in the last 168 hours CBC:  Recent Labs Lab 10/10/12 1724 10/11/12 1316 10/12/12 0208 10/13/12 0212  WBC 8.2 9.6 6.0 6.1  NEUTROABS 6.2 8.4*  --   --   HGB 9.5* 9.5* 7.2* 7.6*  HCT 28.2* 26.3* 20.0* 21.0*  MCV 85.4 79.9 78.7 80.2  PLT 83.0 Repeated and verified X2.* 81* 76* 167   Cardiac Enzymes:  Recent Labs Lab 10/11/12 1952 10/12/12 0208 10/12/12 0752  TROPONINI <0.30 <0.30 <0.30   BNP (last 3 results) No results found for this basename: PROBNP,  in the last 8760 hours CBG: No results found for this basename: GLUCAP,  in the last 168 hours  No results found for this or any previous visit (from the past 240 hour(s)).   Studies: Ct Head Wo Contrast  10/11/2012  *RADIOLOGY REPORT*  Clinical Data: Right leg pain.  Syncope.  CT HEAD WITHOUT CONTRAST  Technique:  Contiguous axial  images were obtained from the base of the skull through the vertex without contrast.  Comparison: None.  Findings: There is no evidence of acute intracranial abnormality including infarct, hemorrhage, mass lesion, mass effect, midline shift or abnormal extra-axial fluid collection.  There is some chronic microvascular ischemic change.  No pneumocephalus or hydrocephalus is identified.  The calvarium is intact.  IMPRESSION: No acute finding.   Original Report Authenticated By: Holley Dexter, M.D.    Mr Femur Right Wo Contrast  10/11/2012  *RADIOLOGY REPORT*  Clinical Data: Posterior thigh pain and swelling for 2 days.  No known injury.  Acute renal failure.  MRI OF THE RIGHT THIGH WITHOUT CONTRAST  Technique:  Multiplanar, multisequence MR imaging of the  right thigh was performed.  No intravenous contrast was administered due to renal failure.  Comparison:   None  Findings:  Study was performed utilizing the body coil. Both thighs are included on the axial and coronal images.  The images extend from the lower pelvis into the distal thighs.  The right biceps femoris muscle is diffusely enlarged and edematous.  Within the distal thigh, there is a "mass" within the biceps muscle which measures 4.9 x 6.6 cm transverse and 9.3 cm cephalocaudad.  This mass demonstrates minimal T1 hyperintensity. There is T2 heterogeneity with a rim of low signal.  The distal biceps tendon is incompletely visualized, but grossly intact without retraction.  In the proximal thigh, there is subcutaneous edema surrounding the hamstring tendons.  Some edema extends into the right hip adductor musculature.  The semimembranosus and semitendinosus muscles appear normal.  The quadriceps musculature appears normal.  No left thigh abnormalities are identified.  Neither femur demonstrates any significant abnormality.  There is no hip joint effusion.  There are postsurgical changes related to aortobifemoral bypass in both groins.  IMPRESSION:  1.  Intramuscular mass distally within the right biceps femoris muscle has some features suggesting that this could reflect a hematoma.  However, neoplasm (sarcoma) cannot be excluded.  If there is no clinical evidence of hematoma or risk factor for intramuscular bleeding, further evaluation with ultrasound guided biopsy or MR followup should be considered in this patient with renal failure who cannot receive intravenous gadolinium. 2.  Nonspecific edema throughout the right biceps and adductor musculature may reflect myositis.  There is overlying subcutaneous edema which may represent cellulitis. Clinical correlation is necessary to exclude compartment syndrome. 3.  No evidence of osteomyelitis or acute osseous abnormality.   Original Report Authenticated By:  Carey Bullocks, M.D.     Scheduled Meds: . atorvastatin  10 mg Oral q1800  . clindamycin (CLEOCIN) IV  600 mg Intravenous Q8H  . feeding supplement  237 mL Oral Q1500  . ferrous fumarate  106 mg of iron Oral Daily  . pantoprazole  40 mg Oral Q1200   Continuous Infusions: . sodium chloride 100 mL/hr at 10/12/12 1000     Marinda Elk  [facility] Pager [phone]. If 8PM-8AM, please contact night-coverage at [email or web address], password [facility] 10/13/2012, 10:30 AM  LOS: 2 days

## 2012-10-13 NOTE — Progress Notes (Signed)
ANTIBIOTIC CONSULT NOTE - FOLLOW UP  Pharmacy Consult for clindamycin Indication: cellulitis  Allergies  Allergen Reactions  . Levofloxacin Other (See Comments)    HEMOLYTIC ANEMIA    Patient Measurements: Height: 5\' 11"  (180.3 cm) Weight: 128 lb (58.06 kg) IBW/kg (Calculated) : 75.3   Vital Signs: Temp: 98.2 F (36.8 C) (04/18 0700) Temp src: Oral (04/18 0700) BP: 126/83 mmHg (04/18 0700) Pulse Rate: 57 (04/18 0700) Intake/Output from previous day: 04/17 0701 - 04/18 0700 In: 757.5 [P.O.:720; Blood:37.5] Out: 2725 [Urine:2725] Intake/Output from this shift: Total I/O In: 120 [P.O.:120] Out: -   Labs:  Recent Labs  10/10/12 1724 10/11/12 1316 10/12/12 0208 10/13/12 0212  WBC 8.2 9.6 6.0 6.1  HGB 9.5* 9.5* 7.2* 7.6*  PLT 83.0 Repeated and verified X2.* 81* 76* 167  CREATININE 2.2* 2.34* 2.10*  --    Estimated Creatinine Clearance: 25 ml/min (by C-G formula based on Cr of 2.1). No results found for this basename: VANCOTROUGH, VANCOPEAK, VANCORANDOM, GENTTROUGH, GENTPEAK, GENTRANDOM, TOBRATROUGH, TOBRAPEAK, TOBRARND, AMIKACINPEAK, AMIKACINTROU, AMIKACIN,  in the last 72 hours   Microbiology: No results found for this or any previous visit (from the past 720 hour(s)).  Anti-infectives   Start     Dose/Rate Route Frequency Ordered Stop   10/11/12 1915  clindamycin (CLEOCIN) IVPB 600 mg     600 mg 100 mL/hr over 30 Minutes Intravenous 3 times per day 10/11/12 1902        Assessment: Patient is a 78 y.o M  On clindamycin day #3 for cellulitis.  Patient is afebrile and wbc wnl.  No cultures.    Plan:  1) Continue clindamycin 600mg  IV q8h 2) pharmacy will sign off since no renal adjustment is needed for clindamycin.  Quisha Mabie P 10/13/2012,9:48 AM

## 2012-10-13 NOTE — Progress Notes (Signed)
D/c orders received;IVs removed with gauze on, pt remains in stable condition, pt meds and instructions reviewed and given to pt; pt d/c to home 

## 2012-10-13 NOTE — Discharge Summary (Signed)
Physician Discharge Summary  [name] JWJ:[id number] DOB: 1934-11-16 DOA: 10/11/2012  PCP: Oliver Barre, MD  Admit date: 10/11/2012 Discharge date: 10/13/2012  Time spent: 40  minutes  Recommendations for Outpatient Follow-up:  1. Follow up with Dr. Dorie Rank in 4 weeks.  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 male Who was brought to the ER by ambulance after having a near-syncopal event at the bank this AM. He was at the counter when he felt light headed, SOB and 'not quite right" . He states he slumped over but never lost consciousness. They brought him a chair and he sat down. After that he began to feel better. No LOC, no bowel or bladder incontinence. No stroke like symptoms  No CP, no fever, no chill. He had not eaten all day. He does not drink much water but does drink a lot of soda and tea. Per EMS and nursing reports, he had positive orthostatic changes.  He saw his PCP yesterday about a pain in his thigh. PCP ordered an MRI but he was unable to get that done.    Hospital Course:  Near syncope due to orthostatic hypotension:  - Orthostatic resolved with IV fluids. - Creatinine improved after hydration. - Basleine Cr. 1.4,  ECHO on 4.17.2014 ejection fraction was in the range of 55% to 60.  - no events on telemetry.  AKI:  - most likely pre-renal, resolved with IV fluids. - Baseline Cr 1.4. Trending down.   Hematoma/ Right thigh pain:  - on SCD's. Has chronic thrombocytopenia being follow by the Dr. Dorie Rank. - Hbg drop from 9.5 to 7.2, s/p 3 unit transfusion hbg  remained greater > 7.5. - cont clindamycin. Change to orals.   Chronic Thrombocytopenia:  - follow up by Dr. Dorie Rank.  - Plt's decreasing. He has had transfusion of platelets for colonoscopy. I will go ahead an transfuse.   Iron deficiency anemia  - cont IV Iron.  - cont Iron therapy orally.    Procedures:  MRI: right thigh showed hematoma.  Consultations:  none  Discharge Exam: Filed Vitals:   10/13/12 0403 10/13/12 0406 10/13/12 0409 10/13/12 0700  BP: 87/59 94/60 83/58  126/83  Pulse: 71 80 83 57  Temp:    98.2 F (36.8 C)  TempSrc:    Oral  Resp: 18 20 20 18   Height:      Weight:      SpO2: 96% 94% 95% 97%    General: see progress note.  Discharge Instructions  Discharge Orders   Future Appointments Provider Department Dept Phone   12/04/2012 1:45 PM Corwin Levins, MD [facility] [phone]   03/06/2013 1:00 PM Delcie Roch [facility] [phone]   03/06/2013 1:30 PM Reece Packer, MD [place] [facility] [phone]   Future Orders Complete By Expires     Diet - low sodium heart healthy  As directed     Increase activity slowly  As directed  Medication List    STOP taking these medications       cephALEXin 500 MG capsule  Commonly known as:  KEFLEX      TAKE these medications       amLODipine 5 MG tablet  Commonly known as:  NORVASC  Take 5 mg by mouth daily before breakfast.     atorvastatin 10 MG tablet  Commonly known as:  LIPITOR  Take 10 mg by mouth daily before breakfast.     clindamycin 300 MG capsule  Commonly known as:  CLEOCIN  Take 1 capsule (300 mg total) by mouth 3 (three) times daily.     ferrous fumarate 325 (106 FE) MG Tabs  Commonly known as:  HEMOCYTE - 106 mg FE  Take 106 mg of iron by mouth daily.     HYDROcodone-acetaminophen 5-325 MG per tablet  Commonly known as:  NORCO/VICODIN  Take 1 tablet by mouth every 6 (six) hours as  needed for pain.     pantoprazole 40 MG tablet  Commonly known as:  PROTONIX  Take 40 mg by mouth daily.           Follow-up Information   Follow up with Oliver Barre, MD.   Contact information:   [address] [place] [address] [phone]       Follow up with Oliver Barre, MD In 2 weeks.   Contact information:   [address] [place] [address] [phone]       Follow up with [name],LENNIS P, MD In 5 weeks. (hospital follow up)    Contact information:   [address] [place] [address] [phone]        The results of significant diagnostics from this hospitalization (including imaging, microbiology, ancillary and laboratory) are listed below for reference.    Significant Diagnostic Studies: Ct Head Wo Contrast  10/11/2012  *RADIOLOGY REPORT*  Clinical Data: Right leg pain.  Syncope.  CT HEAD WITHOUT CONTRAST  Technique:  Contiguous axial images were obtained from the base of the skull through the vertex without contrast.  Comparison: None.  Findings: There is no evidence of acute intracranial abnormality including infarct, hemorrhage, mass lesion, mass effect, midline shift or abnormal extra-axial fluid collection.  There is some chronic microvascular ischemic change.  No pneumocephalus or hydrocephalus is identified.  The calvarium is intact.  IMPRESSION: No acute finding.   Original Report Authenticated By: Holley Dexter, M.D.    Mr Femur Right Wo Contrast  10/11/2012  *RADIOLOGY REPORT*  Clinical Data: Posterior thigh pain and swelling for 2 days.  No known injury.  Acute renal failure.  MRI OF THE RIGHT THIGH WITHOUT CONTRAST  Technique:  Multiplanar, multisequence MR imaging of the right thigh was performed.  No intravenous contrast was administered due to renal failure.  Comparison:   None  Findings:  Study was performed utilizing the body coil. Both thighs are included on the axial and coronal images.  The images extend from the  lower pelvis into the distal thighs.  The right biceps femoris muscle is diffusely enlarged and edematous.  Within the distal thigh, there is a "mass" within the biceps muscle which measures 4.9 x 6.6 cm transverse and 9.3 cm cephalocaudad.  This mass demonstrates minimal T1 hyperintensity. There is T2 heterogeneity with a rim of low signal.  The distal biceps tendon is incompletely visualized, but grossly intact without retraction.  In the proximal thigh, there is subcutaneous edema surrounding the  hamstring tendons.  Some edema extends into the right hip adductor musculature.  The semimembranosus and semitendinosus muscles appear normal.  The quadriceps musculature appears normal.  No left thigh abnormalities are identified.  Neither femur demonstrates any significant abnormality.  There is no hip joint effusion.  There are postsurgical changes related to aortobifemoral bypass in both groins.  IMPRESSION:  1.  Intramuscular mass distally within the right biceps femoris muscle has some features suggesting that this could reflect a hematoma.  However, neoplasm (sarcoma) cannot be excluded.  If there is no clinical evidence of hematoma or risk factor for intramuscular bleeding, further evaluation with ultrasound guided biopsy or MR followup should be considered in this patient with renal failure who cannot receive intravenous gadolinium. 2.  Nonspecific edema throughout the right biceps and adductor musculature may reflect myositis.  There is overlying subcutaneous edema which may represent cellulitis. Clinical correlation is necessary to exclude compartment syndrome. 3.  No evidence of osteomyelitis or acute osseous abnormality.   Original Report Authenticated By: Carey Bullocks, M.D.     Microbiology: No results found for this or any previous visit (from the past 240 hour(s)).   Labs: Basic Metabolic Panel:  Recent Labs Lab 10/10/12 1724 10/11/12 1316 10/12/12 0208  NA 138 136 137  K 4.5 4.8 4.2  CL  105 101 107  CO2 25 23 23   GLUCOSE 115* 126* 93  BUN 25* 33* 36*  CREATININE 2.2* 2.34* 2.10*  CALCIUM 9.3 9.5 8.2*   Liver Function Tests:  Recent Labs Lab 10/10/12 1724  AST 21  ALT 9  ALKPHOS 68  BILITOT 0.9  PROT 7.3  ALBUMIN 3.7   No results found for this basename: LIPASE, AMYLASE,  in the last 168 hours No results found for this basename: AMMONIA,  in the last 168 hours CBC:  Recent Labs Lab 10/10/12 1724 10/11/12 1316 10/12/12 0208 10/13/12 0212  WBC 8.2 9.6 6.0 6.1  NEUTROABS 6.2 8.4*  --   --   HGB 9.5* 9.5* 7.2* 7.6*  HCT 28.2* 26.3* 20.0* 21.0*  MCV 85.4 79.9 78.7 80.2  PLT 83.0 Repeated and verified X2.* 81* 76* 167   Cardiac Enzymes:  Recent Labs Lab 10/11/12 1952 10/12/12 0208 10/12/12 0752  TROPONINI <0.30 <0.30 <0.30   BNP: BNP (last 3 results) No results found for this basename: PROBNP,  in the last 8760 hours CBG: No results found for this basename: GLUCAP,  in the last 168 hours     Signed:  Marinda Elk  [facility] 10/13/2012, 10:39 AM

## 2012-10-14 LAB — TYPE AND SCREEN
Donor AG Type: NEGATIVE
Donor AG Type: NEGATIVE
Unit division: 0

## 2012-10-16 ENCOUNTER — Encounter: Payer: Self-pay | Admitting: Emergency Medicine

## 2012-10-29 ENCOUNTER — Emergency Department (HOSPITAL_COMMUNITY)
Admission: EM | Admit: 2012-10-29 | Discharge: 2012-10-29 | Disposition: A | Discharge status: Home / Self Care | Payer: Medicare Other | Attending: Emergency Medicine | Admitting: Emergency Medicine

## 2012-10-29 ENCOUNTER — Encounter (HOSPITAL_COMMUNITY): Payer: Self-pay | Admitting: Family Medicine

## 2012-10-29 DIAGNOSIS — R229 Localized swelling, mass and lump, unspecified: Secondary | ICD-10-CM | POA: Insufficient documentation

## 2012-10-29 DIAGNOSIS — D649 Anemia, unspecified: Secondary | ICD-10-CM | POA: Insufficient documentation

## 2012-10-29 DIAGNOSIS — N183 Chronic kidney disease, stage 3 unspecified: Secondary | ICD-10-CM | POA: Insufficient documentation

## 2012-10-29 DIAGNOSIS — I129 Hypertensive chronic kidney disease with stage 1 through stage 4 chronic kidney disease, or unspecified chronic kidney disease: Secondary | ICD-10-CM | POA: Insufficient documentation

## 2012-10-29 DIAGNOSIS — F17200 Nicotine dependence, unspecified, uncomplicated: Secondary | ICD-10-CM | POA: Insufficient documentation

## 2012-10-29 DIAGNOSIS — Z8701 Personal history of pneumonia (recurrent): Secondary | ICD-10-CM | POA: Insufficient documentation

## 2012-10-29 DIAGNOSIS — J4489 Other specified chronic obstructive pulmonary disease: Secondary | ICD-10-CM | POA: Insufficient documentation

## 2012-10-29 DIAGNOSIS — K219 Gastro-esophageal reflux disease without esophagitis: Secondary | ICD-10-CM | POA: Insufficient documentation

## 2012-10-29 DIAGNOSIS — Z8669 Personal history of other diseases of the nervous system and sense organs: Secondary | ICD-10-CM | POA: Insufficient documentation

## 2012-10-29 DIAGNOSIS — D696 Thrombocytopenia, unspecified: Secondary | ICD-10-CM | POA: Insufficient documentation

## 2012-10-29 DIAGNOSIS — R2241 Localized swelling, mass and lump, right lower limb: Secondary | ICD-10-CM

## 2012-10-29 DIAGNOSIS — Z872 Personal history of diseases of the skin and subcutaneous tissue: Secondary | ICD-10-CM | POA: Insufficient documentation

## 2012-10-29 DIAGNOSIS — Z79899 Other long term (current) drug therapy: Secondary | ICD-10-CM | POA: Insufficient documentation

## 2012-10-29 DIAGNOSIS — E785 Hyperlipidemia, unspecified: Secondary | ICD-10-CM | POA: Insufficient documentation

## 2012-10-29 DIAGNOSIS — Z8719 Personal history of other diseases of the digestive system: Secondary | ICD-10-CM | POA: Insufficient documentation

## 2012-10-29 DIAGNOSIS — Z8679 Personal history of other diseases of the circulatory system: Secondary | ICD-10-CM | POA: Insufficient documentation

## 2012-10-29 DIAGNOSIS — J449 Chronic obstructive pulmonary disease, unspecified: Secondary | ICD-10-CM | POA: Insufficient documentation

## 2012-10-29 LAB — COMPREHENSIVE METABOLIC PANEL
ALT: 10 U/L (ref 0–53)
Albumin: 3.9 g/dL (ref 3.5–5.2)
Alkaline Phosphatase: 87 U/L (ref 39–117)
Glucose, Bld: 95 mg/dL (ref 70–99)
Potassium: 4 mEq/L (ref 3.5–5.1)
Sodium: 138 mEq/L (ref 135–145)
Total Protein: 8.1 g/dL (ref 6.0–8.3)

## 2012-10-29 LAB — CBC WITH DIFFERENTIAL/PLATELET
Basophils Relative: 1 % (ref 0–1)
Eosinophils Absolute: 0.1 10*3/uL (ref 0.0–0.7)
Lymphs Abs: 1.6 10*3/uL (ref 0.7–4.0)
MCH: 29.4 pg (ref 26.0–34.0)
Neutrophils Relative %: 61 % (ref 43–77)
Platelets: 84 10*3/uL — ABNORMAL LOW (ref 150–400)
RBC: 3.61 MIL/uL — ABNORMAL LOW (ref 4.22–5.81)

## 2012-10-29 LAB — PROTIME-INR: Prothrombin Time: 14.2 seconds (ref 11.6–15.2)

## 2012-10-29 MED ORDER — HYDROCODONE-ACETAMINOPHEN 5-325 MG PO TABS: 1.0000 | ORAL_TABLET | Freq: Four times a day (QID) | ORAL | Status: DC | PRN

## 2012-10-29 MED ORDER — MORPHINE SULFATE 4 MG/ML IJ SOLN
4.0000 mg | Freq: Once | INTRAMUSCULAR | Status: AC
  Administered 2012-10-29: 4 mg via INTRAVENOUS
  Filled 2012-10-29: qty 1

## 2012-10-29 NOTE — ED Notes (Signed)
Per pt sts knot on the back of his right leg that has gotten larger in size. sts was recently discharged from here with bleeding in his muscle. sts the knot never went away but is larger and feels the way it did when it was bleeding last time.

## 2012-10-29 NOTE — ED Notes (Signed)
Dr.Knapp at bedside  

## 2012-10-29 NOTE — ED Notes (Signed)
Pt c/o swelling to back of rt leg. Pt states she was here 10/11/12 for the same problem and was hospitalized for 3 days, pt was supposed to follow up with pcp on the 30th of April but missed his appt. Pt states the swelling on the back of his leg has gotten bigger since then and more painful. Visible swelling to back of rt leg. Pt rates pain 8/10 when lying, 10/10 when ambulating and standing.

## 2012-10-29 NOTE — ED Provider Notes (Signed)
History    CSN: [id number] Arrival date & time 10/29/12  1356 First MD Initiated Contact with Patient 10/29/12 1408     Chief Complaint  Patient presents with  . Leg Swelling    HPI The patient was recently hospitalized at the last month for swelling in his right thigh that was felt to be a spontaneous hematoma. This was associated with anemia, and thrombocytopenia.  The patient had an MRI during his hospitalization which suggested that the thigh swelling was a hematoma. There was a possibility of a mass/sarcoma and they recommended biopsy if the symptoms did not improve/resolve. Patient states he had been doing well but then today he noticed that his thigh was swollen once again. This is significantly worse than yesterday. He denies any fevers or cough. He denies any chest pain or shortness of breath. The area is tender and the pain increases with any palpation or movement. Past Medical History  Diagnosis Date  . Thrombocytopenia   . COPD (chronic obstructive pulmonary disease) 05/06/2011  . Hyperlipidemia 08/30/2011  . Liver damage 1960's    Several liver bx due to elevated liver functions  . Hypertension   . GERD (gastroesophageal reflux disease)   . Anemia   . Iron deficiency anemia 05/26/2011  . Cellulitis 2013; 10/11/2012    "LLE; RLE" (10/11/2012)  . AAA (abdominal aortic aneurysm)   . Abdominal aneurysm without mention of rupture 04/23/2011  . Peripheral arterial disease 04/23/2011  . Pneumonia     "once a few years ago" (10/11/2012)  . Shortness of breath     "just today" (10/11/2012)  . History of blood transfusion 2013    "related to LLE hematoma" (10/11/2012)  . Stage III chronic kidney disease     /notes 10/11/2012  . Syncope and collapse 10/11/2012    Past Surgical History  Procedure Laterality Date  . Abdominal aortic aneurysm repair  2013  . Pr vein bypass graft,aorto-fem-pop    . Esophagogastroduodenoscopy  06/10/2011    Procedure: ESOPHAGOGASTRODUODENOSCOPY (EGD);   Surgeon: Petra Kuba, MD;  Location: [facility] ENDOSCOPY;  Service: Endoscopy;  Laterality: N/A;  . Colonoscopy with propofol  07/19/2012    Procedure: COLONOSCOPY WITH PROPOFOL;  Surgeon: Willis Modena, MD;  Location: [facility] ENDOSCOPY;  Service: Endoscopy;  Laterality: N/A;  polyp lifter needed    Family History  Problem Relation Age of Onset  . Cancer Mother     breast  . Hypertension Father   . Diabetes Sister   . COPD Brother     History  Substance Use Topics  . Smoking status: Current Every Day Smoker -- 0.50 packs/day for 60 years    Types: Cigarettes  . Smokeless tobacco: Never Used  . Alcohol Use: No     Comment: 10/11/2012 "quit drinking > 10 yr ago"; Was a heavy drinker in his 20s-30s      Review of Systems  All other systems reviewed and are negative.    Allergies  Levofloxacin  Home Medications   Current Outpatient Rx  Name  Route  Sig  Dispense  Refill  . amLODipine (NORVASC) 5 MG tablet   Oral   Take 5 mg by mouth daily before breakfast.         . atorvastatin (LIPITOR) 10 MG tablet   Oral   Take 10 mg by mouth daily before breakfast.          . clindamycin (CLEOCIN) 300 MG capsule   Oral   Take 1 capsule (300 mg total)  by mouth 3 (three) times daily.   15 capsule   0   . ferrous sulfate 325 (65 FE) MG tablet   Oral   Take 325 mg by mouth daily with breakfast.         . HYDROcodone-acetaminophen (NORCO/VICODIN) 5-325 MG per tablet   Oral   Take 1 tablet by mouth every 6 (six) hours as needed for pain.   120 tablet   0   . pantoprazole (PROTONIX) 40 MG tablet   Oral   Take 40 mg by mouth daily.         Marland Kitchen HYDROcodone-acetaminophen (NORCO) 5-325 MG per tablet   Oral   Take 1-2 tablets by mouth every 6 (six) hours as needed for pain.   20 tablet   0     BP 136/89  Pulse 82  Temp(Src) 97.2 F (36.2 C)  Resp 18  SpO2 100%  Physical Exam  Nursing note and vitals reviewed. Constitutional: He appears well-developed and  well-nourished. No distress.  HENT:  Head: Normocephalic and atraumatic.  Right Ear: External ear normal.  Left Ear: External ear normal.  Eyes: Conjunctivae are normal. Right eye exhibits no discharge. Left eye exhibits no discharge. No scleral icterus.  Neck: Neck supple. No tracheal deviation present.  Cardiovascular: Normal rate, regular rhythm and intact distal pulses.   Pulmonary/Chest: Effort normal and breath sounds normal. No stridor. No respiratory distress. He has no wheezes. He has no rales.  Abdominal: Soft. Bowel sounds are normal. He exhibits no distension. There is no tenderness. There is no rebound and no guarding.  Musculoskeletal: He exhibits tenderness. He exhibits no edema.  Large mass posterior right thigh, firm and nontender, no erythema, no fluctuance, no distal swelling in the calf, neurovascular intact  Neurological: He is alert. He has normal strength. No sensory deficit. Cranial nerve deficit:  no gross defecits noted. He exhibits normal muscle tone. He displays no seizure activity. Coordination normal.  Skin: Skin is warm and dry. No rash noted.  Psychiatric: He has a normal mood and affect.    ED Course  Procedures (including critical care time)  Labs Reviewed  CBC WITH DIFFERENTIAL - Abnormal; Notable for the following:    RBC 3.61 (*)    Hemoglobin 10.6 (*)    HCT 29.9 (*)    RDW 15.8 (*)    Platelets 84 (*)    All other components within normal limits  COMPREHENSIVE METABOLIC PANEL - Abnormal; Notable for the following:    Creatinine, Ser 1.55 (*)    GFR calc non Af Amer 42 (*)    GFR calc Af Amer 49 (*)    All other components within normal limits  PROTIME-INR  APTT  TYPE AND SCREEN   No results found.   1. Mass of thigh, right   2. Thrombocytopenia       MDM  The patient has persistent mass in his right thigh. Previous thought most likely to be a hematoma of the MRI did suggest biopsy to rule out sarcoma if it persists.  Patient's pain  improved with pain medications. He is hemodynamically stable. His thrombocytopenia and anemia are stable. Patient feels well enough to go home. He will followup with his doctor to discuss the possibility of biopsy and further evaluation        Celene Kras, MD 10/29/12 1544

## 2012-10-29 NOTE — ED Notes (Signed)
Discharge instructions reviewed. Pt verbalized understanding.  

## 2012-10-31 LAB — TYPE AND SCREEN
ABO/RH(D): A POS
DAT, IgG: POSITIVE

## 2012-11-02 ENCOUNTER — Other Ambulatory Visit (INDEPENDENT_AMBULATORY_CARE_PROVIDER_SITE_OTHER): Payer: Medicare Other

## 2012-11-02 ENCOUNTER — Ambulatory Visit (INDEPENDENT_AMBULATORY_CARE_PROVIDER_SITE_OTHER): Payer: Medicare Other | Admitting: Internal Medicine

## 2012-11-02 ENCOUNTER — Encounter: Payer: Self-pay | Admitting: Internal Medicine

## 2012-11-02 VITALS — BP 110/70 | HR 103 | Temp 97.9°F | Ht 71.0 in | Wt 128.0 lb

## 2012-11-02 DIAGNOSIS — Z5189 Encounter for other specified aftercare: Secondary | ICD-10-CM

## 2012-11-02 DIAGNOSIS — D696 Thrombocytopenia, unspecified: Secondary | ICD-10-CM

## 2012-11-02 DIAGNOSIS — I10 Essential (primary) hypertension: Secondary | ICD-10-CM

## 2012-11-02 DIAGNOSIS — I129 Hypertensive chronic kidney disease with stage 1 through stage 4 chronic kidney disease, or unspecified chronic kidney disease: Secondary | ICD-10-CM

## 2012-11-02 DIAGNOSIS — S8011XD Contusion of right lower leg, subsequent encounter: Secondary | ICD-10-CM

## 2012-11-02 DIAGNOSIS — N183 Chronic kidney disease, stage 3 unspecified: Secondary | ICD-10-CM

## 2012-11-02 LAB — CBC WITH DIFFERENTIAL/PLATELET
Eosinophils Absolute: 0 10*3/uL (ref 0.0–0.7)
MCHC: 33.9 g/dL (ref 30.0–36.0)
MCV: 87.7 fl (ref 78.0–100.0)
Monocytes Absolute: 0.8 10*3/uL (ref 0.1–1.0)
Neutrophils Relative %: 56.4 % (ref 43.0–77.0)
Platelets: 117 10*3/uL — ABNORMAL LOW (ref 150.0–400.0)

## 2012-11-02 NOTE — Progress Notes (Signed)
Subjective:    Patient ID: [name], male    DOB: 11/03/1934, 78 y.o.   MRN: [id number]  HPI  Here to f/u, had some dizziness at a [employer] whose staff called EMS for him, see in ER may 4, pt stated Right thigh ? Mild increased swelling but overall pain/redness/bruising improved from the time of the recent MRI.  Pt denies chest pain, increased sob or doe, wheezing, orthopnea, PND, increased LE swelling, palpitations, further  dizziness or syncope.  Pt denies new neurological symptoms such as new headache, or facial or extremity weakness or numbness   No further bruising or bleeding.  Recent plt's noted approx 70K in ER Past Medical History  Diagnosis Date  . Thrombocytopenia   . COPD (chronic obstructive pulmonary disease) 05/06/2011  . Hyperlipidemia 08/30/2011  . Liver damage 1960's    Several liver bx due to elevated liver functions  . Hypertension   . GERD (gastroesophageal reflux disease)   . Anemia   . Iron deficiency anemia 05/26/2011  . Cellulitis 2013; 10/11/2012    "LLE; RLE" (10/11/2012)  . AAA (abdominal aortic aneurysm)   . Abdominal aneurysm without mention of rupture 04/23/2011  . Peripheral arterial disease 04/23/2011  . Pneumonia     "once a few years ago" (10/11/2012)  . Shortness of breath     "just today" (10/11/2012)  . History of blood transfusion 2013    "related to LLE hematoma" (10/11/2012)  . Stage III chronic kidney disease     /notes 10/11/2012  . Syncope and collapse 10/11/2012   Past Surgical History  Procedure Laterality Date  . Abdominal aortic aneurysm repair  2013  . Pr vein bypass graft,aorto-fem-pop    . Esophagogastroduodenoscopy  06/10/2011    Procedure: ESOPHAGOGASTRODUODENOSCOPY (EGD);  Surgeon: Petra Kuba, MD;  Location: [facility] ENDOSCOPY;  Service: Endoscopy;  Laterality: N/A;  . Colonoscopy with propofol  07/19/2012    Procedure: COLONOSCOPY WITH PROPOFOL;  Surgeon: Willis Modena, MD;  Location: [facility] ENDOSCOPY;  Service: Endoscopy;   Laterality: N/A;  polyp lifter needed    reports that he has been smoking Cigarettes.  He has a 30 pack-year smoking history. He has never used smokeless tobacco. He reports that he does not drink alcohol or use illicit drugs. family history includes COPD in his brother; Cancer in his mother; Diabetes in his sister; and Hypertension in his father. Allergies  Allergen Reactions  . Levofloxacin Other (See Comments)    HEMOLYTIC ANEMIA   Current Outpatient Prescriptions on File Prior to Visit  Medication Sig Dispense Refill  . amLODipine (NORVASC) 5 MG tablet Take 5 mg by mouth daily before breakfast.      . atorvastatin (LIPITOR) 10 MG tablet Take 10 mg by mouth daily before breakfast.       . clindamycin (CLEOCIN) 300 MG capsule Take 1 capsule (300 mg total) by mouth 3 (three) times daily.  15 capsule  0  . ferrous sulfate 325 (65 FE) MG tablet Take 325 mg by mouth daily with breakfast.      . HYDROcodone-acetaminophen (NORCO) 5-325 MG per tablet Take 1-2 tablets by mouth every 6 (six) hours as needed for pain.  20 tablet  0  . HYDROcodone-acetaminophen (NORCO/VICODIN) 5-325 MG per tablet Take 1 tablet by mouth every 6 (six) hours as needed for pain.  120 tablet  0  . pantoprazole (PROTONIX) 40 MG tablet Take 40 mg by mouth daily.       No current facility-administered medications on file  prior to visit.   Review of Systems  Constitutional: Negative for unexpected weight change, or unusual diaphoresis  HENT: Negative for tinnitus.   Eyes: Negative for photophobia and visual disturbance.  Respiratory: Negative for choking and stridor.   Gastrointestinal: Negative for vomiting and blood in stool.  Genitourinary: Negative for hematuria and decreased urine volume.  Musculoskeletal: Negative for acute joint swelling Skin: Negative for color change and wound.  Neurological: Negative for tremors and numbness other than noted  Psychiatric/Behavioral: Negative for decreased concentration or   hyperactivity.       Objective:   Physical Exam BP 110/70  Pulse 103  Temp(Src) 97.9 F (36.6 C) (Oral)  Ht 5\' 11"  (1.803 m)  Wt 128 lb (58.06 kg)  BMI 17.86 kg/m2  SpO2 91% VS noted,  Constitutional: Pt appears well-developed and well-nourished.  HENT: Head: NCAT.  Right Ear: External ear normal.  Left Ear: External ear normal.  Eyes: Conjunctivae and EOM are normal. Pupils are equal, round, and reactive to light.  Neck: Normal range of motion. Neck supple.  Cardiovascular: Normal rate and regular rhythm.   Pulmonary/Chest: Effort normal and breath sounds normal.  Abd:  Soft, NT, non-distended, + BS Right post distal thigh with quite large 8 cm area firm induration but with resolved overlying skin changes now normal appearance Neurological: Pt is alert. Not confused , motor intact Skin: Skin is warm. No erythema.  Psychiatric: Pt behavior is normal. Thought content normal.      Assessment & Plan:

## 2012-11-02 NOTE — Assessment & Plan Note (Signed)
stable overall by history and exam, recent data reviewed with pt - MRI and ER noteds, and pt to continue medical treatment as before,  to f/u any worsening symptoms or concerns

## 2012-11-02 NOTE — Assessment & Plan Note (Signed)
stable overall by history and exam, recent data reviewed with pt, and pt to continue medical treatment as before,  to f/u any worsening symptoms or concerns, for f/u cbc Lab Results  Component Value Date   WBC 6.5 11/02/2012   HGB 10.0* 11/02/2012   HCT 29.5* 11/02/2012   MCV 87.7 11/02/2012   PLT 117.0* 11/02/2012

## 2012-11-02 NOTE — Assessment & Plan Note (Signed)
Overall improved, no other bleeding/bruising,  to f/u any worsening symptoms or concerns

## 2012-11-02 NOTE — Assessment & Plan Note (Signed)
stable overall by history and exam, recent data reviewed with pt, and pt to continue medical treatment as before,  to f/u any worsening symptoms or concerns Lab Results  Component Value Date   WBC 6.5 11/02/2012   HGB 10.0* 11/02/2012   HCT 29.5* 11/02/2012   PLT 117.0* 11/02/2012   GLUCOSE 95 10/29/2012   CHOL 188 08/30/2011   TRIG 91.0 08/30/2011   HDL 36.60* 08/30/2011   LDLCALC 133* 08/30/2011   ALT 10 10/29/2012   AST 26 10/29/2012   NA 138 10/29/2012   K 4.0 10/29/2012   CL 104 10/29/2012   CREATININE 1.55* 10/29/2012   BUN 22 10/29/2012   CO2 23 10/29/2012   TSH 0.598 04/29/2012   PSA 0.58 08/30/2011   INR 1.11 10/29/2012   HGBA1C 5.6 03/31/2011

## 2012-11-02 NOTE — Patient Instructions (Addendum)
Please continue all other medications as before, including the pain medication Please call or return with any worsening swelling or pain or fever Please go to the LAB in the Basement (turn left off the elevator) for the tests to be done today You will be contacted by phone if any changes need to be made immediately.  Otherwise, you will receive a letter about your results with an explanation

## 2012-12-04 ENCOUNTER — Ambulatory Visit: Payer: Medicare Other | Admitting: Internal Medicine

## 2012-12-13 ENCOUNTER — Encounter (HOSPITAL_COMMUNITY): Payer: Self-pay | Admitting: Pharmacy Technician

## 2012-12-13 ENCOUNTER — Encounter (HOSPITAL_COMMUNITY): Payer: Self-pay | Admitting: *Deleted

## 2012-12-13 ENCOUNTER — Encounter: Payer: Self-pay | Admitting: Internal Medicine

## 2012-12-13 ENCOUNTER — Ambulatory Visit (INDEPENDENT_AMBULATORY_CARE_PROVIDER_SITE_OTHER): Payer: Medicare Other | Admitting: Internal Medicine

## 2012-12-13 VITALS — BP 102/70 | HR 63 | Temp 98.0°F | Ht 71.0 in | Wt 126.0 lb

## 2012-12-13 DIAGNOSIS — T148XXA Other injury of unspecified body region, initial encounter: Secondary | ICD-10-CM

## 2012-12-13 DIAGNOSIS — J4489 Other specified chronic obstructive pulmonary disease: Secondary | ICD-10-CM

## 2012-12-13 DIAGNOSIS — R197 Diarrhea, unspecified: Secondary | ICD-10-CM

## 2012-12-13 DIAGNOSIS — J209 Acute bronchitis, unspecified: Secondary | ICD-10-CM

## 2012-12-13 DIAGNOSIS — K639 Disease of intestine, unspecified: Secondary | ICD-10-CM

## 2012-12-13 DIAGNOSIS — I10 Essential (primary) hypertension: Secondary | ICD-10-CM

## 2012-12-13 DIAGNOSIS — J449 Chronic obstructive pulmonary disease, unspecified: Secondary | ICD-10-CM

## 2012-12-13 HISTORY — DX: Disease of intestine, unspecified: K63.9

## 2012-12-13 HISTORY — DX: Other injury of unspecified body region, initial encounter: T14.8XXA

## 2012-12-13 MED ORDER — AZITHROMYCIN 250 MG PO TABS: ORAL_TABLET | ORAL | Status: DC

## 2012-12-13 NOTE — Assessment & Plan Note (Signed)
Mild to mod, for antibx course,  to f/u any worsening symptoms or concerns 

## 2012-12-13 NOTE — Patient Instructions (Signed)
Please take all new medication as prescribed - the antibiotic Please continue all other medications as before, and refills have been done if requested. Please also use the Metamucil daily as needed for loose stools  Please keep your appointments with your specialists as you have planned  Please remember to sign up for My Chart if you have not done so, as this will be important to you in the future with finding out test results, communicating by private email, and scheduling acute appointments online when needed.

## 2012-12-13 NOTE — Assessment & Plan Note (Signed)
Exam benign, ok for metamucil daily prn

## 2012-12-13 NOTE — Assessment & Plan Note (Signed)
stable overall by history and exam, recent data reviewed with pt, and pt to continue medical treatment as before,  to f/u any worsening symptoms or concerns SpO2 Readings from Last 3 Encounters:  12/13/12 96%  11/02/12 91%  10/29/12 100%

## 2012-12-13 NOTE — Progress Notes (Signed)
Subjective:    Patient ID: [name], male    DOB: Oct 25, 1934, 78 y.o.   MRN: [id number]  HPI Here with acute onset mild to mod 2-3 days ST, HA, general weakness and malaise, with prod cough greenish sputum, but Pt denies chest pain, increased sob or doe, wheezing, orthopnea, PND, increased LE swelling, palpitations, dizziness or syncope.  Does have occasional loose bowel movement, but Denies worsening reflux, abd pain, dysphagia, n/v, other bowel change or blood.  Pt denies new neurological symptoms such as new headache, or facial or extremity weakness or numbness Past Medical History  Diagnosis Date  . Thrombocytopenia   . COPD (chronic obstructive pulmonary disease) 05/06/2011  . Hyperlipidemia 08/30/2011  . Liver damage 1960's    Several liver bx due to elevated liver functions  . Hypertension   . GERD (gastroesophageal reflux disease)   . Anemia   . Iron deficiency anemia 05/26/2011  . Cellulitis 2013; 10/11/2012    "LLE; RLE" (10/11/2012)  . AAA (abdominal aortic aneurysm)   . Abdominal aneurysm without mention of rupture 04/23/2011  . Peripheral arterial disease 04/23/2011  . Pneumonia     "once a few years ago" (10/11/2012)  . History of blood transfusion 2013    "related to LLE hematoma" (10/11/2012)  . Stage III chronic kidney disease     /notes 10/11/2012  . Syncope and collapse 10/11/2012  . Shortness of breath     none today (12/13/12)  . Hematoma 12-13-12    resolving bilaterally, right thigh remains"lump"  . Bowel trouble 12-13-12    "passing mucus only" seeing Dr. Carvel Getting today   Past Surgical History  Procedure Laterality Date  . Abdominal aortic aneurysm repair  2013  . Pr vein bypass graft,aorto-fem-pop    . Esophagogastroduodenoscopy  06/10/2011    Procedure: ESOPHAGOGASTRODUODENOSCOPY (EGD);  Surgeon: Petra Kuba, MD;  Location: [facility] ENDOSCOPY;  Service: Endoscopy;  Laterality: N/A;  . Colonoscopy with propofol  07/19/2012    Procedure: COLONOSCOPY WITH PROPOFOL;   Surgeon: Willis Modena, MD;  Location: [facility] ENDOSCOPY;  Service: Endoscopy;  Laterality: N/A;  polyp lifter needed    reports that he has been smoking Cigarettes.  He has a 30 pack-year smoking history. He has never used smokeless tobacco. He reports that he does not drink alcohol or use illicit drugs. family history includes COPD in his brother; Cancer in his mother; Diabetes in his sister; and Hypertension in his father. Allergies  Allergen Reactions  . Levofloxacin Other (See Comments)    HEMOLYTIC ANEMIA   Current Outpatient Prescriptions on File Prior to Visit  Medication Sig Dispense Refill  . amLODipine (NORVASC) 5 MG tablet Take 5 mg by mouth daily before breakfast.      . ferrous sulfate 325 (65 FE) MG tablet Take 325 mg by mouth daily with breakfast.       No current facility-administered medications on file prior to visit.   Review of Systems  Constitutional: Negative for unexpected weight change, or unusual diaphoresis  HENT: Negative for tinnitus.   Eyes: Negative for photophobia and visual disturbance.  Respiratory: Negative for choking and stridor.   Gastrointestinal: Negative for vomiting and blood in stool.  Genitourinary: Negative for hematuria and decreased urine volume.  Musculoskeletal: Negative for acute joint swelling Skin: Negative for color change and wound.  Neurological: Negative for tremors and numbness other than noted  Psychiatric/Behavioral: Negative for decreased concentration or  hyperactivity.       Objective:   Physical Exam BP 102/70  Pulse 63  Temp(Src) 98 F (36.7 C) (Oral)  Ht 5\' 11"  (1.803 m)  Wt 126 lb (57.153 kg)  BMI 17.58 kg/m2  SpO2 96% VS noted, mild ill Constitutional: Pt appears well-developed and well-nourished.  HENT: Head: NCAT.  Right Ear: External ear normal.  Left Ear: External ear normal.  Bilat tm's with mild erythema.  Max sinus areas non tender.  Pharynx with mild erythema, no exudate Eyes: Conjunctivae and EOM are  normal. Pupils are equal, round, and reactive to light.  Neck: Normal range of motion. Neck supple.  Cardiovascular: Normal rate and regular rhythm.   Pulmonary/Chest: Effort normal and breath sounds decreased, no rales or wheezing Abd:  Soft, NT, non-distended, + BS Neurological: Pt is alert. Motor grossly intact Skin: Skin is warm. No erythema. No LE edema Psychiatric: Pt behavior is normal. Thought content normal.     Assessment & Plan:

## 2012-12-13 NOTE — Assessment & Plan Note (Signed)
stable overall by history and exam, recent data reviewed with pt, and pt to continue medical treatment as before,  to f/u any worsening symptoms or concerns BP Readings from Last 3 Encounters:  12/13/12 102/70  11/02/12 110/70  10/29/12 135/77

## 2012-12-22 ENCOUNTER — Telehealth: Payer: Self-pay

## 2012-12-22 NOTE — Telephone Encounter (Signed)
Told [name] that a [employer] Card is here with important information that he should keep the card in his wallet per Dr. Darrold Span. It will be with injection nurse for him to pick up in the next week at his convenience.

## 2013-01-02 ENCOUNTER — Other Ambulatory Visit: Payer: Self-pay | Admitting: Gastroenterology

## 2013-01-02 NOTE — Addendum Note (Signed)
Addended by: Cornell Gaber on: 01/02/2013 04:58 PM   Modules accepted: Orders  

## 2013-01-03 ENCOUNTER — Encounter (HOSPITAL_COMMUNITY): Payer: Self-pay | Admitting: Anesthesiology

## 2013-01-03 ENCOUNTER — Ambulatory Visit (HOSPITAL_COMMUNITY)
Admission: RE | Admit: 2013-01-03 | Discharge: 2013-01-03 | Disposition: A | Discharge status: Home / Self Care | Payer: Medicare Other | Source: Ambulatory Visit | Attending: Gastroenterology | Admitting: Gastroenterology

## 2013-01-03 ENCOUNTER — Ambulatory Visit (HOSPITAL_COMMUNITY): Payer: Medicare Other | Admitting: Anesthesiology

## 2013-01-03 ENCOUNTER — Encounter (HOSPITAL_COMMUNITY): Payer: Self-pay

## 2013-01-03 ENCOUNTER — Encounter (HOSPITAL_COMMUNITY)
Admission: RE | Disposition: A | Discharge status: Home / Self Care | Payer: Self-pay | Source: Ambulatory Visit | Attending: Gastroenterology

## 2013-01-03 DIAGNOSIS — K219 Gastro-esophageal reflux disease without esophagitis: Secondary | ICD-10-CM | POA: Insufficient documentation

## 2013-01-03 DIAGNOSIS — D128 Benign neoplasm of rectum: Secondary | ICD-10-CM | POA: Insufficient documentation

## 2013-01-03 DIAGNOSIS — K5730 Diverticulosis of large intestine without perforation or abscess without bleeding: Secondary | ICD-10-CM | POA: Insufficient documentation

## 2013-01-03 DIAGNOSIS — J449 Chronic obstructive pulmonary disease, unspecified: Secondary | ICD-10-CM | POA: Insufficient documentation

## 2013-01-03 DIAGNOSIS — Z860100 Personal history of colon polyps, unspecified: Secondary | ICD-10-CM | POA: Insufficient documentation

## 2013-01-03 DIAGNOSIS — I10 Essential (primary) hypertension: Secondary | ICD-10-CM | POA: Insufficient documentation

## 2013-01-03 DIAGNOSIS — J4489 Other specified chronic obstructive pulmonary disease: Secondary | ICD-10-CM | POA: Insufficient documentation

## 2013-01-03 DIAGNOSIS — D126 Benign neoplasm of colon, unspecified: Secondary | ICD-10-CM | POA: Insufficient documentation

## 2013-01-03 HISTORY — DX: Disease of intestine, unspecified: K63.9

## 2013-01-03 HISTORY — DX: Other injury of unspecified body region, initial encounter: T14.8XXA

## 2013-01-03 HISTORY — PX: COLONOSCOPY WITH PROPOFOL: SHX5780

## 2013-01-03 LAB — BASIC METABOLIC PANEL
BUN: 14 mg/dL (ref 6–23)
CO2: 25 mEq/L (ref 19–32)
Chloride: 99 mEq/L (ref 96–112)
Creatinine, Ser: 1.46 mg/dL — ABNORMAL HIGH (ref 0.50–1.35)
GFR calc Af Amer: 52 mL/min — ABNORMAL LOW (ref >90)
Glucose, Bld: 81 mg/dL (ref 70–99)
Potassium: 3.5 mEq/L (ref 3.5–5.1)

## 2013-01-03 LAB — CBC
HCT: 33.6 % — ABNORMAL LOW (ref 39.0–52.0)
Hemoglobin: 11.7 g/dL — ABNORMAL LOW (ref 13.0–17.0)
MCH: 28.3 pg (ref 26.0–34.0)
MCHC: 34.8 g/dL (ref 30.0–36.0)
MCV: 81.4 fL (ref 78.0–100.0)
RDW: 14.1 % (ref 11.5–15.5)

## 2013-01-03 SURGERY — COLONOSCOPY WITH PROPOFOL
Anesthesia: Monitor Anesthesia Care

## 2013-01-03 MED ORDER — MIDAZOLAM HCL 5 MG/5ML IJ SOLN
INTRAMUSCULAR | Status: DC | PRN
  Administered 2013-01-03 (×2): 1 mg via INTRAVENOUS

## 2013-01-03 MED ORDER — PROPOFOL INFUSION 10 MG/ML OPTIME
INTRAVENOUS | Status: DC | PRN
  Administered 2013-01-03: 100 ug/kg/min via INTRAVENOUS

## 2013-01-03 MED ORDER — KETAMINE HCL 10 MG/ML IJ SOLN: INTRAMUSCULAR | Status: DC | PRN

## 2013-01-03 MED ORDER — LACTATED RINGERS IV SOLN
INTRAVENOUS | Status: DC
  Administered 2013-01-03: 09:00:00 via INTRAVENOUS

## 2013-01-03 MED ORDER — KETAMINE HCL 10 MG/ML IJ SOLN
INTRAMUSCULAR | Status: DC | PRN
  Administered 2013-01-03 (×2): 10 mg via INTRAVENOUS

## 2013-01-03 MED ORDER — SODIUM CHLORIDE 0.9 % IV SOLN: INTRAVENOUS | Status: DC

## 2013-01-03 SURGICAL SUPPLY — 22 items

## 2013-01-03 NOTE — Anesthesia Preprocedure Evaluation (Signed)
Anesthesia Evaluation  Patient identified by MRN, date of birth, ID band Patient awake    Reviewed: Allergy & Precautions, H&P , NPO status , Patient's Chart, lab work & pertinent test results  Airway Mallampati: II TM Distance: >3 FB Neck ROM: Full    Dental no notable dental hx. (+) Edentulous Upper and Edentulous Lower   Pulmonary COPDCurrent Smoker,  breath sounds clear to auscultation  Pulmonary exam normal       Cardiovascular hypertension, Pt. on medications + Peripheral Vascular Disease (s/p AAA repair) Rhythm:Regular Rate:Normal     Neuro/Psych negative neurological ROS  negative psych ROS   GI/Hepatic Neg liver ROS, GERD-  Medicated,  Endo/Other  negative endocrine ROS  Renal/GU Renal InsufficiencyRenal disease  negative genitourinary   Musculoskeletal negative musculoskeletal ROS (+)   Abdominal   Peds negative pediatric ROS (+)  Hematology negative hematology ROS (+) thrombobytopenia   Anesthesia Other Findings   Reproductive/Obstetrics negative OB ROS                           Anesthesia Physical  Anesthesia Plan  ASA: III  Anesthesia Plan: MAC   Post-op Pain Management:    Induction:   Airway Management Planned: Simple Face Mask  Additional Equipment:   Intra-op Plan:   Post-operative Plan:   Informed Consent: I have reviewed the patients History and Physical, chart, labs and discussed the procedure including the risks, benefits and alternatives for the proposed anesthesia with the patient or authorized representative who has indicated his/her understanding and acceptance.   Dental advisory given  Plan Discussed with: CRNA  Anesthesia Plan Comments:         Anesthesia Quick Evaluation

## 2013-01-03 NOTE — H&P (Signed)
Patient interval history reviewed.  Patient examined again.  There has been no change from documented H/P dated 12/06/2012 (scanned into chart from our office) except as documented above.  Assessment:  1.  Large colon polyp, removed January 2014.  Plan:  1.  Colonoscopy to inspect polypectomy site, and removal any further polyp tissue if present. 2.  Risks (bleeding, infection, bowel perforation that could require surgery, sedation-related changes in cardiopulmonary systems), benefits (identification and possible treatment of source of symptoms, exclusion of certain causes of symptoms), and alternatives (watchful waiting, radiographic imaging studies, empiric medical treatment) of colonoscopy were explained to patient in detail and patient wishes to proceed.

## 2013-01-03 NOTE — Op Note (Addendum)
[facility] [address] [place], [address]   COLONOSCOPY PROCEDURE REPORT  PATIENT: [name], [name]  MR#: [id number] BIRTHDATE: 20-Jul-1934 , 77  yrs. old GENDER: Male ENDOSCOPIST: Willis Modena, MD REFERRED XB:JYNWG John, M.D. PROCEDURE DATE:  01/03/2013 PROCEDURE:   Colonoscopy with snare polypectomy ASA CLASS:   Class III INDICATIONS:Follow-up recent removal large colon polyp. MEDICATIONS: MAC sedation, administered by CRNA DESCRIPTION OF PROCEDURE:   After the risks benefits and alternatives of the procedure were thoroughly explained, informed consent was obtained.  A digital rectal exam revealed no abnormalities of the rectum.   The Pentax Ped Colon [id number] endoscope was introduced through the anus and advanced to the cecum, which was identified by both the appendix and ileocecal valve. No adverse events experienced.   The quality of the prep was fair.  The instrument was then slowly withdrawn as the colon was fully examined.   Findings:  Normal digital rectal exam.  Prep quality fair; small or subtle polyps could easily have been missed.  6mm  ascending colon polyp and 5mm rectal polyp both removed with snare cautery. Polypectomy scar with tattoo marking from prior recent large polyp removal noted in transverse colon; no evidence of residual polyp tissue seen.  Few sigmoid diverticula.  Retroflexed view of rectum normal.       Withdrawal time was about 10 minutes .  The scope was withdrawn and the procedure completed.  ENDOSCOPIC IMPRESSION:     As above.  No evidence of residual polyp, at prior large transverse colon polypectomy site.  RECOMMENDATIONS:     1.  Watch for potential complications of procedure. 2.  Await polypectomy results. 3.  Repeat colonoscopy in 3 years, with two-day prep with propofol, pending patient's state-of-health at that time. 4.  Follow-up with [facility] on as-needed basis. eSigned:  Willis Modena, MD 01/03/2013 10:27  AMRevised: 01/03/2013 10:27 AM cc:

## 2013-01-03 NOTE — Transfer of Care (Signed)
Immediate Anesthesia Transfer of Care Note  Patient: [name]  Procedure(s) Performed: Procedure(s): COLONOSCOPY WITH PROPOFOL (N/A)  Patient Location: PACU  Anesthesia Type:MAC  Level of Consciousness: awake, alert  and oriented  Airway & Oxygen Therapy: Patient Spontanous Breathing and Patient connected to face mask oxygen  Post-op Assessment: Report given to PACU RN and Post -op Vital signs reviewed and stable  Post vital signs: Reviewed and stable  Complications: No apparent anesthesia complications

## 2013-01-04 ENCOUNTER — Encounter (HOSPITAL_COMMUNITY): Payer: Self-pay | Admitting: Gastroenterology

## 2013-01-04 NOTE — Anesthesia Postprocedure Evaluation (Signed)
  Anesthesia Post-op Note  Patient: [name]  Procedure(s) Performed: Procedure(s) (LRB): COLONOSCOPY WITH PROPOFOL (N/A)  Patient Location: PACU  Anesthesia Type: MAC  Level of Consciousness: awake and alert   Airway and Oxygen Therapy: Patient Spontanous Breathing  Post-op Pain: mild  Post-op Assessment: Post-op Vital signs reviewed, Patient's Cardiovascular Status Stable, Respiratory Function Stable, Patent Airway and No signs of Nausea or vomiting  Last Vitals:  Filed Vitals:   01/03/13 1040  BP: 160/92  Pulse:   Temp:   Resp: 12    Post-op Vital Signs: stable   Complications: No apparent anesthesia complications

## 2013-01-05 ENCOUNTER — Inpatient Hospital Stay (HOSPITAL_COMMUNITY)
Admission: EM | Admit: 2013-01-05 | Discharge: 2013-01-11 | DRG: 919 | Disposition: A | Discharge status: Home / Self Care | Payer: Medicare Other | Attending: Internal Medicine | Admitting: Internal Medicine

## 2013-01-05 ENCOUNTER — Encounter (HOSPITAL_COMMUNITY): Payer: Self-pay | Admitting: Emergency Medicine

## 2013-01-05 DIAGNOSIS — D72829 Elevated white blood cell count, unspecified: Secondary | ICD-10-CM

## 2013-01-05 DIAGNOSIS — I10 Essential (primary) hypertension: Secondary | ICD-10-CM

## 2013-01-05 DIAGNOSIS — K219 Gastro-esophageal reflux disease without esophagitis: Secondary | ICD-10-CM | POA: Diagnosis present

## 2013-01-05 DIAGNOSIS — IMO0002 Reserved for concepts with insufficient information to code with codable children: Principal | ICD-10-CM | POA: Diagnosis present

## 2013-01-05 DIAGNOSIS — E785 Hyperlipidemia, unspecified: Secondary | ICD-10-CM

## 2013-01-05 DIAGNOSIS — R197 Diarrhea, unspecified: Secondary | ICD-10-CM

## 2013-01-05 DIAGNOSIS — K5730 Diverticulosis of large intestine without perforation or abscess without bleeding: Secondary | ICD-10-CM | POA: Diagnosis present

## 2013-01-05 DIAGNOSIS — F17200 Nicotine dependence, unspecified, uncomplicated: Secondary | ICD-10-CM | POA: Diagnosis present

## 2013-01-05 DIAGNOSIS — Z0000 Encounter for general adult medical examination without abnormal findings: Secondary | ICD-10-CM

## 2013-01-05 DIAGNOSIS — D599 Acquired hemolytic anemia, unspecified: Secondary | ICD-10-CM | POA: Diagnosis present

## 2013-01-05 DIAGNOSIS — R202 Paresthesia of skin: Secondary | ICD-10-CM

## 2013-01-05 DIAGNOSIS — Z95828 Presence of other vascular implants and grafts: Secondary | ICD-10-CM

## 2013-01-05 DIAGNOSIS — D509 Iron deficiency anemia, unspecified: Secondary | ICD-10-CM

## 2013-01-05 DIAGNOSIS — E0590 Thyrotoxicosis, unspecified without thyrotoxic crisis or storm: Secondary | ICD-10-CM

## 2013-01-05 DIAGNOSIS — I714 Abdominal aortic aneurysm, without rupture, unspecified: Secondary | ICD-10-CM

## 2013-01-05 DIAGNOSIS — N390 Urinary tract infection, site not specified: Secondary | ICD-10-CM

## 2013-01-05 DIAGNOSIS — S8012XS Contusion of left lower leg, sequela: Secondary | ICD-10-CM

## 2013-01-05 DIAGNOSIS — Y849 Medical procedure, unspecified as the cause of abnormal reaction of the patient, or of later complication, without mention of misadventure at the time of the procedure: Secondary | ICD-10-CM | POA: Diagnosis present

## 2013-01-05 DIAGNOSIS — D62 Acute posthemorrhagic anemia: Secondary | ICD-10-CM | POA: Diagnosis present

## 2013-01-05 DIAGNOSIS — J4489 Other specified chronic obstructive pulmonary disease: Secondary | ICD-10-CM | POA: Diagnosis present

## 2013-01-05 DIAGNOSIS — E876 Hypokalemia: Secondary | ICD-10-CM

## 2013-01-05 DIAGNOSIS — R55 Syncope and collapse: Secondary | ICD-10-CM

## 2013-01-05 DIAGNOSIS — I2699 Other pulmonary embolism without acute cor pulmonale: Secondary | ICD-10-CM

## 2013-01-05 DIAGNOSIS — K922 Gastrointestinal hemorrhage, unspecified: Secondary | ICD-10-CM

## 2013-01-05 DIAGNOSIS — R001 Bradycardia, unspecified: Secondary | ICD-10-CM

## 2013-01-05 DIAGNOSIS — D5919 Other autoimmune hemolytic anemia: Secondary | ICD-10-CM

## 2013-01-05 DIAGNOSIS — Z79899 Other long term (current) drug therapy: Secondary | ICD-10-CM

## 2013-01-05 DIAGNOSIS — K625 Hemorrhage of anus and rectum: Secondary | ICD-10-CM

## 2013-01-05 DIAGNOSIS — N183 Chronic kidney disease, stage 3 unspecified: Secondary | ICD-10-CM

## 2013-01-05 DIAGNOSIS — Z681 Body mass index (BMI) 19 or less, adult: Secondary | ICD-10-CM

## 2013-01-05 DIAGNOSIS — D696 Thrombocytopenia, unspecified: Secondary | ICD-10-CM

## 2013-01-05 DIAGNOSIS — E43 Unspecified severe protein-calorie malnutrition: Secondary | ICD-10-CM

## 2013-01-05 DIAGNOSIS — Z8679 Personal history of other diseases of the circulatory system: Secondary | ICD-10-CM

## 2013-01-05 DIAGNOSIS — I498 Other specified cardiac arrhythmias: Secondary | ICD-10-CM

## 2013-01-05 DIAGNOSIS — D126 Benign neoplasm of colon, unspecified: Secondary | ICD-10-CM | POA: Diagnosis present

## 2013-01-05 DIAGNOSIS — J209 Acute bronchitis, unspecified: Secondary | ICD-10-CM

## 2013-01-05 DIAGNOSIS — K635 Polyp of colon: Secondary | ICD-10-CM

## 2013-01-05 DIAGNOSIS — I129 Hypertensive chronic kidney disease with stage 1 through stage 4 chronic kidney disease, or unspecified chronic kidney disease: Secondary | ICD-10-CM | POA: Diagnosis present

## 2013-01-05 DIAGNOSIS — N179 Acute kidney failure, unspecified: Secondary | ICD-10-CM

## 2013-01-05 DIAGNOSIS — J449 Chronic obstructive pulmonary disease, unspecified: Secondary | ICD-10-CM

## 2013-01-05 DIAGNOSIS — I739 Peripheral vascular disease, unspecified: Secondary | ICD-10-CM

## 2013-01-05 LAB — MAGNESIUM: Magnesium: 1.8 mg/dL (ref 1.5–2.5)

## 2013-01-05 LAB — COMPREHENSIVE METABOLIC PANEL
ALT: 7 U/L (ref 0–53)
AST: 25 U/L (ref 0–37)
Albumin: 3.2 g/dL — ABNORMAL LOW (ref 3.5–5.2)
Alkaline Phosphatase: 66 U/L (ref 39–117)
CO2: 23 mEq/L (ref 19–32)
Chloride: 104 mEq/L (ref 96–112)
Creatinine, Ser: 1.3 mg/dL (ref 0.50–1.35)
GFR calc non Af Amer: 52 mL/min — ABNORMAL LOW (ref >90)
Potassium: 4.2 mEq/L (ref 3.5–5.1)
Sodium: 136 mEq/L (ref 135–145)
Total Bilirubin: 0.3 mg/dL (ref 0.3–1.2)

## 2013-01-05 LAB — CBC
Hemoglobin: 9.3 g/dL — ABNORMAL LOW (ref 13.0–17.0)
Hemoglobin: 8.6 g/dL — ABNORMAL LOW (ref 13.0–17.0)
MCH: 28.2 pg (ref 26.0–34.0)
MCH: 28.8 pg (ref 26.0–34.0)
MCHC: 34.8 g/dL (ref 30.0–36.0)
MCV: 81.4 fL (ref 78.0–100.0)
MCV: 80.9 fL (ref 78.0–100.0)
MCV: 80.9 fL (ref 78.0–100.0)
Platelets: 64 10*3/uL — ABNORMAL LOW (ref 150–400)
Platelets: 63 10*3/uL — ABNORMAL LOW (ref 150–400)
Platelets: 52 10*3/uL — ABNORMAL LOW (ref 150–400)
RBC: 3.54 MIL/uL — ABNORMAL LOW (ref 4.22–5.81)
RBC: 3.3 MIL/uL — ABNORMAL LOW (ref 4.22–5.81)
RBC: 2.99 MIL/uL — ABNORMAL LOW (ref 4.22–5.81)
RDW: 13.9 % (ref 11.5–15.5)
RDW: 14.1 % (ref 11.5–15.5)
WBC: 4.4 10*3/uL (ref 4.0–10.5)

## 2013-01-05 LAB — URINALYSIS, ROUTINE W REFLEX MICROSCOPIC
Glucose, UA: NEGATIVE mg/dL
Ketones, ur: NEGATIVE mg/dL
Leukocytes, UA: NEGATIVE
Protein, ur: NEGATIVE mg/dL
Urobilinogen, UA: 1 mg/dL (ref 0.0–1.0)

## 2013-01-05 LAB — URINE MICROSCOPIC-ADD ON

## 2013-01-05 LAB — APTT: aPTT: 35 seconds (ref 24–37)

## 2013-01-05 LAB — PROTIME-INR
INR: 1.16 (ref 0.00–1.49)
Prothrombin Time: 14.6 seconds (ref 11.6–15.2)

## 2013-01-05 MED ORDER — ATORVASTATIN CALCIUM 10 MG PO TABS
10.0000 mg | ORAL_TABLET | Freq: Every morning | ORAL | Status: DC
  Administered 2013-01-06 – 2013-01-11 (×6): 10 mg via ORAL
  Filled 2013-01-05 (×6): qty 1

## 2013-01-05 MED ORDER — ONDANSETRON HCL 4 MG PO TABS: 4.0000 mg | ORAL_TABLET | Freq: Four times a day (QID) | ORAL | Status: DC | PRN

## 2013-01-05 MED ORDER — ALUM & MAG HYDROXIDE-SIMETH 200-200-20 MG/5ML PO SUSP: 30.0000 mL | Freq: Four times a day (QID) | ORAL | Status: DC | PRN

## 2013-01-05 MED ORDER — SORBITOL 70 % SOLN
30.0000 mL | Freq: Every day | Status: DC | PRN
  Filled 2013-01-05: qty 30

## 2013-01-05 MED ORDER — ONDANSETRON HCL 4 MG/2ML IJ SOLN: 4.0000 mg | Freq: Four times a day (QID) | INTRAMUSCULAR | Status: DC | PRN

## 2013-01-05 MED ORDER — HYDROCODONE-ACETAMINOPHEN 5-325 MG PO TABS: 1.0000 | ORAL_TABLET | Freq: Four times a day (QID) | ORAL | Status: DC | PRN

## 2013-01-05 MED ORDER — SODIUM CHLORIDE 0.9 % IJ SOLN
3.0000 mL | Freq: Two times a day (BID) | INTRAMUSCULAR | Status: DC
  Administered 2013-01-08 – 2013-01-11 (×6): 3 mL via INTRAVENOUS

## 2013-01-05 MED ORDER — POLYETHYLENE GLYCOL 3350 17 G PO PACK
17.0000 g | PACK | Freq: Every day | ORAL | Status: DC | PRN
  Filled 2013-01-05: qty 1

## 2013-01-05 MED ORDER — AMLODIPINE BESYLATE 5 MG PO TABS
5.0000 mg | ORAL_TABLET | Freq: Every day | ORAL | Status: DC
  Administered 2013-01-05 – 2013-01-06 (×2): 5 mg via ORAL
  Filled 2013-01-05 (×3): qty 1

## 2013-01-05 MED ORDER — MAGNESIUM CITRATE PO SOLN: 1.0000 | Freq: Once | ORAL | Status: AC | PRN

## 2013-01-05 MED ORDER — SODIUM CHLORIDE 0.9 % IV BOLUS (SEPSIS)
1000.0000 mL | Freq: Once | INTRAVENOUS | Status: AC
  Administered 2013-01-05: 1000 mL via INTRAVENOUS

## 2013-01-05 MED ORDER — SODIUM CHLORIDE 0.9 % IV SOLN
INTRAVENOUS | Status: AC
  Administered 2013-01-05 – 2013-01-07 (×4): via INTRAVENOUS

## 2013-01-05 MED ORDER — ACETAMINOPHEN 650 MG RE SUPP: 650.0000 mg | Freq: Four times a day (QID) | RECTAL | Status: DC | PRN

## 2013-01-05 MED ORDER — ACETAMINOPHEN 325 MG PO TABS: 650.0000 mg | ORAL_TABLET | Freq: Four times a day (QID) | ORAL | Status: DC | PRN

## 2013-01-05 MED ORDER — FERROUS SULFATE 325 (65 FE) MG PO TABS
325.0000 mg | ORAL_TABLET | Freq: Every day | ORAL | Status: DC
  Administered 2013-01-06 – 2013-01-11 (×6): 325 mg via ORAL
  Filled 2013-01-05 (×7): qty 1

## 2013-01-05 MED ORDER — POLYETHYLENE GLYCOL 3350 17 G PO PACK
17.0000 g | PACK | Freq: Three times a day (TID) | ORAL | Status: DC
  Filled 2013-01-05 (×8): qty 1

## 2013-01-05 MED ORDER — SODIUM CHLORIDE 0.9 % IV BOLUS (SEPSIS): 1000.0000 mL | Freq: Once | INTRAVENOUS | Status: DC

## 2013-01-05 NOTE — ED Notes (Signed)
Pt reports having colonoscopy on Wednesday.  States he started having bloody stool afterwords.  Pt reports 3-4 since yesterday. Sent here by Dr. Dulce Sellar.

## 2013-01-05 NOTE — ED Provider Notes (Signed)
[name] is a 78 y.o. male who presents for evaluation of rectal bleeding. He had colonoscopy with 2 biopsies, 3 days ago. Since that time, he has had 5 bowel movements associated with bright red blood.  Patient is ambulatory, cooperative. He denies dizziness after walking.  Assessment: Significant drop of hemoglobin about 1.5 g since, colonoscopy and biopsy. He will require admission, for observation and repeat hemoglobin testing  Medical screening examination/treatment/procedure(s) were conducted as a shared visit with non-physician practitioner(s) and myself.  I personally evaluated the patient during the encounter  Flint Melter, MD 01/06/13 [phone]

## 2013-01-05 NOTE — ED Notes (Signed)
PA at bedside.

## 2013-01-05 NOTE — Progress Notes (Signed)
Patient states his computer is torn up at home. Patient declines My Chart.  Briscoe Burns BSN, RN-BC Admissions RN  01/05/2013 4:14 PM

## 2013-01-05 NOTE — H&P (Signed)
[facility] History and Physical  [name] AVW:[id number] DOB: Mar 09, 1935 DOA: 01/05/2013  Referring physician: Dr. Effie Shy PCP: Oliver Barre, MD  Specialists: Gastroenterology: Dr. Dulce Sellar  Chief Complaint: Bright red blood per rectum  HPI: [name] is a 78 y.o. male with history of thrombocytopenia, hyperlipidemia, hypertension, AAA status post repair, peripheral arterial disease, thrombocytopenia, COPD with ongoing tobacco abuse who had a recent colonoscopy with polypectomy done on Wednesday, 01/03/2013 2 days prior to admission. Patient was presented to the EEG with a 2 day history of bloody bowel movements. Patient stated that one day prior to admission post colonoscopy he had 3 bloody bowel movements. On the day of admission patient had one bloody bowel movement at home and one in the emergency room. Patient subsequently presented to the ED. Patient denies any fevers, no chills, no chest pain, no shortness of breath, no abdominal pain, no dizziness, no syncope, no diaphoresis, no diarrhea, no constipation, no weakness. Patient does endorse a chronic cough. No other associated symptoms. Patient was in the emergency room had one bloody bowel movement. Hemoglobin checked in the ED was 10.1 from 11.7 prior to colonoscopy.  Review of Systems: The patient denies anorexia, fever, weight loss,, vision loss, decreased hearing, hoarseness, chest pain, syncope, dyspnea on exertion, peripheral edema, balance deficits, hemoptysis, abdominal pain, melena, hematochezia, severe indigestion/heartburn, hematuria, incontinence, genital sores, muscle weakness, suspicious skin lesions, transient blindness, difficulty walking, depression, unusual weight change, abnormal bleeding, enlarged lymph nodes, angioedema, and breast masses.   Past Medical History  Diagnosis Date  . Thrombocytopenia   . Hyperlipidemia 08/30/2011  . Liver damage 1960's    Several liver bx due to elevated liver functions  .  Hypertension   . GERD (gastroesophageal reflux disease)   . Anemia   . Iron deficiency anemia 05/26/2011  . Cellulitis 2013; 10/11/2012    "LLE; RLE" (10/11/2012)  . AAA (abdominal aortic aneurysm)   . Abdominal aneurysm without mention of rupture 04/23/2011  . Peripheral arterial disease 04/23/2011  . Pneumonia     "once a few years ago" (10/11/2012)  . History of blood transfusion 2013    "related to LLE hematoma" (10/11/2012)  . Syncope and collapse 10/11/2012  . Shortness of breath     none today (12/13/12)  . Hematoma 12-13-12    resolving bilaterally, right thigh remains"lump"  . Bowel trouble 12-13-12    "passing mucus only" seeing Dr. Carvel Getting today  . COPD (chronic obstructive pulmonary disease) 05/06/2011  . Stage III chronic kidney disease     /notes 10/11/2012   Past Surgical History  Procedure Laterality Date  . Abdominal aortic aneurysm repair  2013  . Pr vein bypass graft,aorto-fem-pop    . Esophagogastroduodenoscopy  06/10/2011    Procedure: ESOPHAGOGASTRODUODENOSCOPY (EGD);  Surgeon: Petra Kuba, MD;  Location: [facility] ENDOSCOPY;  Service: Endoscopy;  Laterality: N/A;  . Colonoscopy with propofol  07/19/2012    Procedure: COLONOSCOPY WITH PROPOFOL;  Surgeon: Willis Modena, MD;  Location: [facility] ENDOSCOPY;  Service: Endoscopy;  Laterality: N/A;  polyp lifter needed  . Colonoscopy with propofol N/A 01/03/2013    Procedure: COLONOSCOPY WITH PROPOFOL;  Surgeon: Willis Modena, MD;  Location: [facility] ENDOSCOPY;  Service: Endoscopy;  Laterality: N/A;   Social History:  reports that he has been smoking Cigarettes.  He has a 30 pack-year smoking history. He has never used smokeless tobacco. He reports that he does not drink alcohol or use illicit drugs.  Allergies  Allergen Reactions  . Levofloxacin Other (See Comments)  HEMOLYTIC ANEMIA    Family History  Problem Relation Age of Onset  . Cancer Mother     breast  . Hypertension Father   . Diabetes Sister   . COPD Brother      Prior to Admission medications   Medication Sig Start Date End Date Taking? Authorizing Provider  amLODipine (NORVASC) 5 MG tablet Take 5 mg by mouth daily before breakfast.   Yes Historical Provider, MD  atorvastatin (LIPITOR) 10 MG tablet Take 10 mg by mouth every morning.   Yes Historical Provider, MD  ferrous sulfate 325 (65 FE) MG tablet Take 325 mg by mouth daily with breakfast.   Yes Historical Provider, MD  HYDROcodone-acetaminophen (NORCO/VICODIN) 5-325 MG per tablet Take 1-2 tablets by mouth every 6 (six) hours as needed for pain.   Yes Historical Provider, MD   Physical Exam: Filed Vitals:   01/05/13 1430 01/05/13 1445 01/05/13 1500 01/05/13 1505  BP:    145/78  Pulse: 43 53 63   Temp:    97.9 F (36.6 C)  TempSrc:      Resp: 15 14 18 17   Height:      Weight:      SpO2: 100% 100% 99% 100%     General:  Well-developed well-nourished in no acute cardiopulmonary distress.  Eyes: Pupils equal round and reactive to light and accommodation. Extraocular movements intact.  Neck: Supple with no lymphadenopathy. No JVD.  Cardiovascular: Bradycardic,  no murmurs rubs or gallops.  Respiratory: Clear to auscultation bilaterally. No wheezing, no crackles, no rhonchi.  Abdomen: soft, nontender, nondistended, positive bowel sounds.  Skin: no rashes or lesions.  Musculoskeletal: 5/5 BUE strength, 5/5 BLE strength  Psychiatric: Normal mood. Normal affect. Good insight. Good judgment.  Neurologic: alert and oriented x3. Cranial nerves II through XII are grossly intact. No focal deficits.  Labs on Admission:   Basic Metabolic Panel:  Recent Labs Lab 01/03/13 0830 01/05/13 1155  NA 138 136  K 3.5 4.2  CL 99 104  CO2 25 23  GLUCOSE 81 85  BUN 14 13  CREATININE 1.46* 1.30  CALCIUM 9.8 8.9   Liver Function Tests:  Recent Labs Lab 01/05/13 1155  AST 25  ALT 7  ALKPHOS 66  BILITOT 0.3  PROT 7.2  ALBUMIN 3.2*    Recent Labs Lab 01/05/13 1155  LIPASE 35    No results found for this basename: AMMONIA,  in the last 168 hours CBC:  Recent Labs Lab 01/03/13 0830 01/05/13 1155  WBC 5.2 4.4  HGB 11.7* 10.1*  HCT 33.6* 28.8*  MCV 81.4 81.4  PLT 73* 64*   Cardiac Enzymes: No results found for this basename: CKTOTAL, CKMB, CKMBINDEX, TROPONINI,  in the last 168 hours  BNP (last 3 results) No results found for this basename: PROBNP,  in the last 8760 hours CBG: No results found for this basename: GLUCAP,  in the last 168 hours  Radiological Exams on Admission: No results found.  EKG: Independently reviewed. sinus bradycardia  Assessment/Plan Principal Problem:   Lower GI bleed Active Problems:   COPD (chronic obstructive pulmonary disease)   Iron deficiency anemia   Bradycardia, sinus   HTN (hypertension)   Hyperlipidemia   Colon polyps   Acute blood loss anemia   #1 lower GI bleed Patient with recent colonoscopy with polypectomy. Likely secondary to recent colonoscopy with polypectomy in the setting of thrombocytopenia. Patient with 2 day episode of bright red blood per rectum. Patient's hemoglobin is currently at 10.1  today from 11.7  2 days prior to admission and prior to colonoscopy. Will admit to telemetry. In will check a stat CBC now and one in 6 hours and one in the morning. Follow H&H. Will keep on clear liquids for now. Miralax TID.Transfusion threshold hemoglobin less than 8. Dr. Randa Evens with  gastroenterology is consulting and appreciate his input and recommendations.  #2 acute blood loss anemia/iron deficiency anemia Patient is noted to have a hemoglobin of 10.1 today from 11.7 prior to colonoscopy. Patient also with a history of iron deficiency anemia. Follow H&H and transfuse if hemoglobin is less than 8. See problem #1.  #3 thrombocytopenia Patient does have a history of thrombocytopenia which is felt to be secondary to a platelet function defect and also does have a history of hemolytic anemia. Platelet count  today 64. This is being followed as outpatient per oncology. Follow.  #4 sinus bradycardia Patient does have a history of sinus bradycardia. Heart rate increases with movement. Patient currently asymptomatic. Follow.  #5 hypertension Will resume home dose Norvasc.  #6 prophylaxis SCDs for DVT prophylaxis.   Code Status: Full Family Communication: Updated patient and wife at bedside. Disposition Plan: Admit to telemetry.  Time spent: 60 mins  [facility] Pager [phone]  If 7PM-7AM, please contact night-coverage [email or web address] Password [facility] 01/05/2013, 4:16 PM

## 2013-01-05 NOTE — Consult Note (Signed)
[facility] CONSULT Reason for consult: Lower G.I. Bleeding following colonoscopy Referring Physician: PCP: Dr. Oliver Barre Holmes is an 78 y.o. male.  HPI: 78 year old gentleman who has a history of very large adenoma it was removed. He has a history of thrombocytopenia and on one occasion had platelets prior to removal of a large polyp. Interestingly, he had no problems with bleeding with the removal of this large polyp. 2 days ago and repeat procedure done here in the hospital to remove any residual polyp. The plan was to have the APC available if needed. The area to previous polypectomy was seen in the scar. The patient had a few diverticulosis. He had an ascending colon and rectal polyp both about 5 mm will remove the spare car. Yesterday evening he passed small dark stool and then [name] stool with some bright blood today. He is not felt weak or dizzy. In the emergency room is hemoglobin was 10.1 platelet count 64. Patient is known to have thrombocytopenia and has been followed by Dr. Darrold Holmes. Apparently this is been stable and intermittent in etiology is somewhat unclear. He denies abdominal pain nausea and otherwise feels fine.  Past Medical History  Diagnosis Date  . Thrombocytopenia   . Hyperlipidemia 08/30/2011  . Liver damage 1960's    Several liver bx due to elevated liver functions  . Hypertension   . GERD (gastroesophageal reflux disease)   . Anemia   . Iron deficiency anemia 05/26/2011  . Cellulitis 2013; 10/11/2012    "LLE; RLE" (10/11/2012)  . AAA (abdominal aortic aneurysm)   . Abdominal aneurysm without mention of rupture 04/23/2011  . Peripheral arterial disease 04/23/2011  . Pneumonia     "once a few years ago" (10/11/2012)  . History of blood transfusion 2013    "related to LLE hematoma" (10/11/2012)  . Syncope and collapse 10/11/2012  . Shortness of breath     none today (12/13/12)  . Hematoma 12-13-12    resolving bilaterally, right thigh remains"lump"  .  Bowel trouble 12-13-12    "passing mucus only" seeing Dr. Carvel Holmes today  . COPD (chronic obstructive pulmonary disease) 05/06/2011  . Stage III chronic kidney disease     /notes 10/11/2012    Past Surgical History  Procedure Laterality Date  . Abdominal aortic aneurysm repair  2013  . Pr vein bypass graft,aorto-fem-pop    . Esophagogastroduodenoscopy  06/10/2011    Procedure: ESOPHAGOGASTRODUODENOSCOPY (EGD);  Surgeon: Petra Kuba, MD;  Location: [facility] ENDOSCOPY;  Service: Endoscopy;  Laterality: N/A;  . Colonoscopy with propofol  07/19/2012    Procedure: COLONOSCOPY WITH PROPOFOL;  Surgeon: Willis Modena, MD;  Location: [facility] ENDOSCOPY;  Service: Endoscopy;  Laterality: N/A;  polyp lifter needed  . Colonoscopy with propofol N/A 01/03/2013    Procedure: COLONOSCOPY WITH PROPOFOL;  Surgeon: Willis Modena, MD;  Location: [facility] ENDOSCOPY;  Service: Endoscopy;  Laterality: N/A;    Family History  Problem Relation Age of Onset  . Cancer Mother     breast  . Hypertension Father   . Diabetes Sister   . COPD Brother     Social History:  reports that he has been smoking Cigarettes.  He has a 30 pack-year smoking history. He has never used smokeless tobacco. He reports that he does not drink alcohol or use illicit drugs.  Allergies:  Allergies  Allergen Reactions  . Levofloxacin Other (See Comments)    HEMOLYTIC ANEMIA    Medications; . amLODipine  5 mg Oral QAC breakfast  . [  START ON 01/06/2013] atorvastatin  10 mg Oral q morning - 10a  . [START ON 01/06/2013] ferrous sulfate  325 mg Oral Q breakfast  . polyethylene glycol  17 g Oral TID  . sodium chloride  3 mL Intravenous Q12H   PRN Meds acetaminophen, acetaminophen, alum & mag hydroxide-simeth, HYDROcodone-acetaminophen, magnesium citrate, ondansetron (ZOFRAN) IV, ondansetron, polyethylene glycol, sorbitol Results for orders placed during the hospital encounter of 01/05/13 (from the past 48 hour(s))  CBC     Status: Abnormal   Collection  Time    01/05/13 11:55 AM      Result Value Range   WBC 4.4  4.0 - 10.5 K/uL   RBC 3.54 (*) 4.22 - 5.81 MIL/uL   Hemoglobin 10.1 (*) 13.0 - 17.0 g/dL   HCT 40.9 (*) 81.1 - 91.4 %   MCV 81.4  78.0 - 100.0 fL   MCH 28.5  26.0 - 34.0 pg   MCHC 35.1  30.0 - 36.0 g/dL   RDW 78.2  95.6 - 21.3 %   Platelets 64 (*) 150 - 400 K/uL   Comment: SPECIMEN CHECKED FOR CLOTS     REPEATED TO VERIFY     PLATELET COUNT CONFIRMED BY SMEAR  COMPREHENSIVE METABOLIC PANEL     Status: Abnormal   Collection Time    01/05/13 11:55 AM      Result Value Range   Sodium 136  135 - 145 mEq/Holmes   Potassium 4.2  3.5 - 5.1 mEq/Holmes   Comment: SLIGHT HEMOLYSIS     HEMOLYSIS AT THIS LEVEL MAY AFFECT RESULT   Chloride 104  96 - 112 mEq/Holmes   CO2 23  19 - 32 mEq/Holmes   Glucose, Bld 85  70 - 99 mg/dL   BUN 13  6 - 23 mg/dL   Creatinine, Ser 0.86  0.50 - 1.35 mg/dL   Calcium 8.9  8.4 - 57.8 mg/dL   Total Protein 7.2  6.0 - 8.3 g/dL   Albumin 3.2 (*) 3.5 - 5.2 g/dL   AST 25  0 - 37 U/Holmes   ALT 7  0 - 53 U/Holmes   Alkaline Phosphatase 66  39 - 117 U/Holmes   Total Bilirubin 0.3  0.3 - 1.2 mg/dL   GFR calc non Af Amer 52 (*) >90 mL/min   GFR calc Af Amer 60 (*) >90 mL/min   Comment:            The eGFR has been calculated     using the CKD EPI equation.     This calculation has not been     validated in all clinical     situations.     eGFR's persistently     <90 mL/min signify     possible Chronic Kidney Disease.  TYPE AND SCREEN     Status: None   Collection Time    01/05/13 11:55 AM      Result Value Range   ABO/RH(D) A POS     Antibody Screen POS     Sample Expiration 01/08/2013     Antibody Identification WARM AUTOANTIBODY     DAT, IgG POS     Antibody ID,T [place] NO SPECIFIC ANTIBODY DEMONSTRATED IN ELU[place]    LACTIC ACID, PLASMA     Status: None   Collection Time    01/05/13 11:55 AM      Result Value Range   Lactic Acid, Venous 1.1  0.5 - 2.2 mmol/Holmes  LIPASE, BLOOD     Status:  None   Collection Time    01/05/13  11:55 AM      Result Value Range   Lipase 35  11 - 59 U/Holmes  APTT     Status: None   Collection Time    01/05/13 11:55 AM      Result Value Range   aPTT 35  24 - 37 seconds  PROTIME-INR     Status: None   Collection Time    01/05/13 11:55 AM      Result Value Range   Prothrombin Time 14.6  11.6 - 15.2 seconds   INR 1.16  0.00 - 1.49  OCCULT BLOOD, POC DEVICE     Status: Abnormal   Collection Time    01/05/13 12:17 PM      Result Value Range   Fecal Occult Bld POSITIVE (*) NEGATIVE  URINALYSIS, ROUTINE W REFLEX MICROSCOPIC     Status: Abnormal   Collection Time    01/05/13  1:18 PM      Result Value Range   Color, Urine YELLOW  YELLOW   APPearance CLEAR  CLEAR   Specific Gravity, Urine 1.012  1.005 - 1.030   pH 6.0  5.0 - 8.0   Glucose, UA NEGATIVE  NEGATIVE mg/dL   Hgb urine dipstick SMALL (*) NEGATIVE   Bilirubin Urine NEGATIVE  NEGATIVE   Ketones, ur NEGATIVE  NEGATIVE mg/dL   Protein, ur NEGATIVE  NEGATIVE mg/dL   Urobilinogen, UA 1.0  0.0 - 1.0 mg/dL   Nitrite NEGATIVE  NEGATIVE   Leukocytes, UA NEGATIVE  NEGATIVE  URINE MICROSCOPIC-ADD ON     Status: None   Collection Time    01/05/13  1:18 PM      Result Value Range   Squamous Epithelial / LPF RARE  RARE   RBC / HPF 0-2  <3 RBC/hpf    No results found.             Blood pressure 145/78, pulse 63, temperature 97.9 F (36.6 C), temperature source Oral, resp. rate 17, height 5\' 11"  (1.803 m), weight 57.153 kg (126 lb), SpO2 100.00%.  Physical exam:   General-- alert African-American male no distress Heart-- regular rate and rhythm without murmurs are gallops Lungs--clear Abdomen-- soft and nontender   Assessment: 1. Lower G.I. bleeding. This is almost certainly due to recent polypectomy. These polyps were quite small. Interestingly, he had a massive 4 cm polyp removed within the past several months without any problems with bleeding. 2. Chronic thrombocytopenia  Plan: 1. Would continue  supportive care for now. Patient is 2 days post polypectomy and hopefully the bleeding stopped. If bleeding does not stop we could go ahead and perform a colonoscopy with attempt to cauterize the bleeding site in the ascending colon or rectum. Usually, a small polypectomy site should stop bleeding within 3 to 4 days. We will follow with you.   [name],[name] 01/05/2013, 4:40 PM

## 2013-01-05 NOTE — ED Provider Notes (Signed)
History    CSN: [id number] Arrival date & time 01/05/13  1018  First MD Initiated Contact with Patient 01/05/13 1112     Chief Complaint  Patient presents with  . Rectal Bleeding   (Consider location/radiation/quality/duration/timing/severity/associated sxs/prior Treatment) The history is provided by the patient, the spouse and a relative. No language interpreter was used.  [name] is a 78 y/o M with PMHx of colon polyps, thrombocytopenia, HLD, liver damage, HTN, GERD, iron deficiency, COPD, stage III chronic kidney disease, AAA repair without rupture in 2013, presenting to the ED, with wife and family, with blood in stool x 2 days - patient reported that he noted bright red blood in his stool and when wiping yesterday and today - 3 episodes yesterday and 2 episodes today. Patient reported that he had a colonoscopy performed Wednesday (01/03/2013) - looked back and reviewed patient's chart had a colonoscopy performed on Wednesday 01/03/2013 with removal of 6 mm ascending colon and 5 mm rectal polyp with snare cautery - patient was on propofol 3 days prior to the procedure. Patient stated that he has been feeling weak over the past couple of days with a mild headache. Patient called Dr. Dulce Sellar who recommended patient to come to the ED today. Denied chest pain, shortness of breath, abdominal pain, constipation, diarrhea, blurred vision, numbness, tingling, neck pain, back pain, nausea, vomiting, melena, urinary symptoms. PCP Dr. Oliver Barre GI Dr. Willis Modena  Past Medical History  Diagnosis Date  . Thrombocytopenia   . Hyperlipidemia 08/30/2011  . Liver damage 1960's    Several liver bx due to elevated liver functions  . Hypertension   . GERD (gastroesophageal reflux disease)   . Anemia   . Iron deficiency anemia 05/26/2011  . Cellulitis 2013; 10/11/2012    "LLE; RLE" (10/11/2012)  . AAA (abdominal aortic aneurysm)   . Abdominal aneurysm without mention of rupture 04/23/2011  .  Peripheral arterial disease 04/23/2011  . Pneumonia     "once a few years ago" (10/11/2012)  . History of blood transfusion 2013    "related to LLE hematoma" (10/11/2012)  . Syncope and collapse 10/11/2012  . Shortness of breath     none today (12/13/12)  . Hematoma 12-13-12    resolving bilaterally, right thigh remains"lump"  . Bowel trouble 12-13-12    "passing mucus only" seeing Dr. Carvel Getting today  . COPD (chronic obstructive pulmonary disease) 05/06/2011  . Stage III chronic kidney disease     /notes 10/11/2012   Past Surgical History  Procedure Laterality Date  . Abdominal aortic aneurysm repair  2013  . Pr vein bypass graft,aorto-fem-pop    . Esophagogastroduodenoscopy  06/10/2011    Procedure: ESOPHAGOGASTRODUODENOSCOPY (EGD);  Surgeon: Petra Kuba, MD;  Location: [facility] ENDOSCOPY;  Service: Endoscopy;  Laterality: N/A;  . Colonoscopy with propofol  07/19/2012    Procedure: COLONOSCOPY WITH PROPOFOL;  Surgeon: Willis Modena, MD;  Location: [facility] ENDOSCOPY;  Service: Endoscopy;  Laterality: N/A;  polyp lifter needed  . Colonoscopy with propofol N/A 01/03/2013    Procedure: COLONOSCOPY WITH PROPOFOL;  Surgeon: Willis Modena, MD;  Location: [facility] ENDOSCOPY;  Service: Endoscopy;  Laterality: N/A;   Family History  Problem Relation Age of Onset  . Cancer Mother     breast  . Hypertension Father   . Diabetes Sister   . COPD Brother    History  Substance Use Topics  . Smoking status: Current Every Day Smoker -- 0.50 packs/day for 60 years    Types: Cigarettes  .  Smokeless tobacco: Never Used  . Alcohol Use: No     Comment: 10/11/2012 "quit drinking > 10 yr ago"; Was a heavy drinker in his 20s-30s    Review of Systems  Constitutional: Negative for fever and chills.  HENT: Negative for neck pain.   Eyes: Negative for visual disturbance.  Gastrointestinal: Positive for blood in stool. Negative for nausea, vomiting, abdominal pain, diarrhea and constipation.  Genitourinary: Negative for  dysuria, hematuria, decreased urine volume and difficulty urinating.  Musculoskeletal: Negative for back pain.  Neurological: Positive for headaches. Negative for dizziness, weakness, light-headedness and numbness.  All other systems reviewed and are negative.    Allergies  Levofloxacin  Home Medications   Current Outpatient Rx  Name  Route  Sig  Dispense  Refill  . amLODipine (NORVASC) 5 MG tablet   Oral   Take 5 mg by mouth daily before breakfast.         . atorvastatin (LIPITOR) 10 MG tablet   Oral   Take 10 mg by mouth every morning.         . ferrous sulfate 325 (65 FE) MG tablet   Oral   Take 325 mg by mouth daily with breakfast.         . HYDROcodone-acetaminophen (NORCO/VICODIN) 5-325 MG per tablet   Oral   Take 1-2 tablets by mouth every 6 (six) hours as needed for pain.          BP 145/78  Pulse 63  Temp(Src) 97.9 F (36.6 C) (Oral)  Resp 17  Ht 5\' 11"  (1.803 m)  Wt 126 lb (57.153 kg)  BMI 17.58 kg/m2  SpO2 100% Physical Exam  Nursing note and vitals reviewed. Constitutional: He is oriented to person, place, and time. He appears well-developed and well-nourished. No distress.  HENT:  Head: Normocephalic and atraumatic.  Mouth/Throat: Oropharynx is clear and moist. No oropharyngeal exudate.  Eyes: Conjunctivae and EOM are normal. Pupils are equal, round, and reactive to light. Right eye exhibits no discharge. Left eye exhibits no discharge.  Neck: Normal range of motion. Neck supple.  Cardiovascular: Normal rate, regular rhythm and normal heart sounds.   No murmur heard. Pulses:      Radial pulses are 2+ on the right side, and 2+ on the left side.       Dorsalis pedis pulses are 1+ on the right side, and 1+ on the left side.  Negative foot and ankle swelling Radial pulses 2+ bilaterally, mildly diminished DP pulses bilaterally upon palpation   Pulmonary/Chest: Breath sounds normal. No respiratory distress. He has no wheezes. He has no rales.   Abdominal: Soft. Bowel sounds are normal. He exhibits no distension. There is no tenderness. There is no rebound and no guarding.  Genitourinary: Guaiac positive stool.  Rectal: Negative masses, lesions, sores, hemorrhoids noted to the anus. Negative masses, lesions palpated. Strong rectal sphincter tone. Positive blood on glove.   Lymphadenopathy:    He has no cervical adenopathy.  Neurological: He is alert and oriented to person, place, and time. No cranial nerve deficit. He exhibits normal muscle tone. Coordination normal.  Skin: Skin is warm and dry. No rash noted. He is not diaphoretic. No erythema.  Psychiatric: He has a normal mood and affect. His behavior is normal. Thought content normal.    ED Course  Procedures (including critical care time)  2:49PM Patient has a bowel movement - tech reported mainly of loose stools with bright red blood noted in the toilet bowl.  3:07PM Discussed case with Dr. Randa Evens, from [facility], covering for Dr. Lenoria Farrier - recommended patient to continue with IV fluids, clear liquid diet, and Miralax. Recommended patient to be admitted to the hospital for observation - Dr. Randa Evens reported that he will see patient and consult with him while patient is in the hospital.   3:32PM Spoke with Dr. Isla Pence, [facility], discussed case - Dr. Isla Pence to admit patient to the hospital for observation, Telemetry team 8. Discussed Dr. Randa Evens recommendations and that he will consult with patient.     Date: 01/05/2013  Rate: 51  Rhythm: normal sinus rhythm and sinus bradycardia  QRS Axis: normal  Intervals: normal  ST/T Wave abnormalities: normal  Conduction Disutrbances:none  Narrative Interpretation: atrial premature complex, baseline wander in leads V5, V6  Old EKG Reviewed: changes noted  Medications  sodium chloride 0.9 % bolus 1,000 mL (not administered)  sodium chloride 0.9 % bolus 1,000 mL (0 mLs Intravenous Stopped 01/05/13  1320)  sodium chloride 0.9 % bolus 1,000 mL (0 mLs Intravenous Stopped 01/05/13 1412)    Labs Reviewed  CBC - Abnormal; Notable for the following:    RBC 3.54 (*)    Hemoglobin 10.1 (*)    HCT 28.8 (*)    Platelets 64 (*)    All other components within normal limits  COMPREHENSIVE METABOLIC PANEL - Abnormal; Notable for the following:    Albumin 3.2 (*)    GFR calc non Af Amer 52 (*)    GFR calc Af Amer 60 (*)    All other components within normal limits  URINALYSIS, ROUTINE W REFLEX MICROSCOPIC - Abnormal; Notable for the following:    Hgb urine dipstick SMALL (*)    All other components within normal limits  OCCULT BLOOD, POC DEVICE - Abnormal; Notable for the following:    Fecal Occult Bld POSITIVE (*)    All other components within normal limits  LACTIC ACID, PLASMA  LIPASE, BLOOD  APTT  PROTIME-INR  URINE MICROSCOPIC-ADD ON  CBC  TYPE AND SCREEN   No results found. 1. Rectal bleeding   2. HLD (hyperlipidemia)   3. Thrombocytopenia   4. S/P AAA repair   5. AAA (abdominal aortic aneurysm) without rupture   6. COPD (chronic obstructive pulmonary disease)   7. Stage III chronic kidney disease    MDM Number of Diagnoses or Management Options AAA (abdominal aortic aneurysm) without rupture:  COPD (chronic obstructive pulmonary disease):  HLD (hyperlipidemia):  Rectal bleeding:  S/P AAA repair:  Stage III chronic kidney disease:  Thrombocytopenia:    MDM  Patient presenting to the ED with blood in stool x 2 days shortly after colonoscopy with polypectomy on Wednesday (01/03/2013), performed by Dr. Willis Modena - described as being bright red blood in stool and bright red blood on toilet paper - approximately 3 episodes yesterday and 2 episodes today, as per patient. Negative acute abdomen, negative peritoneal signs. Bright red blood on glove, positive fecal occult. UA small Hgb noted - negative infection. CBC drop in Hgb noted from 11.7 on 01/03/2013 to 10.1 today, drop  in Hct 33.6 on 01/03/2013 to 28.8 today, platelets decreased from 73 on 01/03/2013 to 64 today. CMP negative findings.  Lactic acid negative. Lipase negative elevation. INR, PT, aPTT negative elevations. EKG negative ischemic changes noted. 3 L IV fluids given in ED setting - vitals monitored every 15 minutes. Cardiac monitoring and pulse oximetry noted. Negative increase in heart rate, negative drop in  blood pressure - doubt patient to be in hemorrhagic shock.    Spoke with Dr. Randa Evens, covering for Dr. Dulce Sellar, recommended patient to be admitted for rectal bleeding and observation - is going to come see patient for consult. Discussed case with Dr. Isla Pence of [facility] - going to admit patient to Telemetry for acute rectal bleeding without shock, will monitor patient.  Discussed plan with patient and family - agreed to plan of care.     Raymon Mutton, PA-C 01/05/13 2058

## 2013-01-06 DIAGNOSIS — K625 Hemorrhage of anus and rectum: Secondary | ICD-10-CM

## 2013-01-06 LAB — BASIC METABOLIC PANEL
Chloride: 107 mEq/L (ref 96–112)
GFR calc Af Amer: 71 mL/min — ABNORMAL LOW (ref >90)
Potassium: 3.4 mEq/L — ABNORMAL LOW (ref 3.5–5.1)

## 2013-01-06 LAB — CBC
HCT: 24 % — ABNORMAL LOW (ref 39.0–52.0)
HCT: 26 % — ABNORMAL LOW (ref 39.0–52.0)
HCT: 23.4 % — ABNORMAL LOW (ref 39.0–52.0)
Hemoglobin: 8.7 g/dL — ABNORMAL LOW (ref 13.0–17.0)
Hemoglobin: 9.1 g/dL — ABNORMAL LOW (ref 13.0–17.0)
Hemoglobin: 8.2 g/dL — ABNORMAL LOW (ref 13.0–17.0)
MCH: 28.6 pg (ref 26.0–34.0)
MCHC: 35 g/dL (ref 30.0–36.0)
MCV: 81.5 fL (ref 78.0–100.0)
RBC: 3.19 MIL/uL — ABNORMAL LOW (ref 4.22–5.81)
WBC: 4.6 10*3/uL (ref 4.0–10.5)
WBC: 4.7 10*3/uL (ref 4.0–10.5)

## 2013-01-06 MED ORDER — POTASSIUM CHLORIDE CRYS ER 20 MEQ PO TBCR
40.0000 meq | EXTENDED_RELEASE_TABLET | Freq: Once | ORAL | Status: AC
  Administered 2013-01-06: 40 meq via ORAL
  Filled 2013-01-06: qty 2

## 2013-01-06 NOTE — Progress Notes (Addendum)
Pt had second episode of bright red blood per rectum. Moderate amount, saturated bed pad and sheet. MD notified. VS stable, hgb to be re-checked and serial hgb q8h. Back to clear liquid diet. GI notified. Allena Pietila, Lavone Orn, RN

## 2013-01-06 NOTE — Evaluation (Signed)
Physical Therapy Evaluation Patient Details Name: [name] MRN: [id number] DOB: 1934-11-18 Today's Date: 01/06/2013 Time: 4098-1191 PT Time Calculation (min): 15 min  PT Assessment / Plan / Recommendation History of Present Illness  78 yo male with recent colonoscopy admitted with 2 bloody bowel movement  Clinical Impression  Pt is independent in gait on level surfaces and stairs without device    PT Assessment  Patent does not need any further PT services    Follow Up Recommendations  No PT follow up    Does the patient have the potential to tolerate intense rehabilitation      Barriers to Discharge        Equipment Recommendations  None recommended by PT    Recommendations for Other Services     Frequency      Precautions / Restrictions     Pertinent Vitals/Pain No c/o pain      Mobility  Bed Mobility Bed Mobility: Supine to Sit Supine to Sit: 7: Independent Transfers Transfers: Sit to Stand;Stand to Sit Sit to Stand: 7: Independent Stand to Sit: 7: Independent Ambulation/Gait Ambulation/Gait Assistance: 7: Independent Ambulation Distance (Feet): 300 Feet Assistive device: None Ambulation/Gait Assistance Details: occasion loss of balance, but pt able to self correct Gait Pattern: Within Functional Limits Gait velocity: [facility] Gait Details: pt ambulate with long stride length and turns head frequently. Minor loss of balance that he is able to self correct Stairs: Yes Stairs Assistance: 6: Modified independent (Device/Increase time) Stair Management Technique: One rail Right;One rail Left Number of Stairs: 3    Exercises     PT Diagnosis:    PT Problem List:   PT Treatment Interventions:       PT Goals(Current goals can be found in the care plan section)    Visit Information  Last PT Received On: 01/06/13 Assistance Needed: +1 History of Present Illness: 78 yo male with recent colonoscopy admitted with 2 bloody bowel movement        Prior Functioning  Home Living Family/patient expects to be discharged to:: Private residence Living Arrangements: Spouse/significant other Available Help at Discharge: Family Type of Home: Apartment Home Layout: Two level Home Equipment: None Prior Function Level of Independence: Independent Communication Communication: No difficulties    Cognition  Cognition Arousal/Alertness: Awake/alert Behavior During Therapy: WFL for tasks assessed/performed Overall Cognitive Status: Within Functional Limits for tasks assessed    Extremity/Trunk Assessment Lower Extremity Assessment Lower Extremity Assessment: Overall WFL for tasks assessed Cervical / Trunk Assessment Cervical / Trunk Assessment: Normal   Balance Balance Balance Assessed: Yes Static Sitting Balance Static Sitting - Balance Support: No upper extremity supported Static Sitting - Level of Assistance: 7: Independent Static Standing Balance Static Standing - Balance Support: No upper extremity supported Static Standing - Level of Assistance: 7: Independent Standardized Balance Assessment Standardized Balance Assessment: Dynamic Gait Index Dynamic Gait Index Level Surface: Mild Impairment Change in Gait Speed: Normal Gait with Horizontal Head Turns: Normal Gait with Vertical Head Turns: Normal Gait and Pivot Turn: Normal Step Over Obstacle: Normal Step Around Obstacles: Normal Steps: Normal Total Score: 23 High Level Balance High Level Balance Activites: Backward walking  End of Session PT - End of Session Activity Tolerance: Patient tolerated treatment well Patient left: with family/visitor present Nurse Communication: Mobility status  GP Functional Assessment Tool Used: Dynamic Gait Index  pt scored 23/24 Functional Limitation: Mobility: Walking and moving around Mobility: Walking and Moving Around Current Status (Y7829): At least 1 percent but less than  20 percent impaired, limited or restricted Mobility:  Walking and Moving Around Goal Status [id number]): At least 1 percent but less than 20 percent impaired, limited or restricted Mobility: Walking and Moving Around Discharge Status [id number]): At least 1 percent but less than 20 percent impaired, limited or restricted    Rosey Bath K. Wrens, [place] [phone] 01/06/2013, 12:07 PM

## 2013-01-06 NOTE — Progress Notes (Signed)
[facility] Progress Note  Subjective: Pt had a very small BM with appearance of blood at 7 pm yest, none since  Objective: Vital signs in last 24 hours: Temp:  [97.6 F (36.4 C)-98.6 F (37 C)] 98.2 F (36.8 C) (07/12 0607) Pulse Rate:  [43-68] 63 (07/12 0607) Resp:  [13-21] 16 (07/12 0607) BP: (130-166)/(71-95) 142/81 mmHg (07/12 0607) SpO2:  [99 %-100 %] 99 % (07/12 0607) Weight:  [57.153 kg (126 lb)] 57.153 kg (126 lb) (07/12 0607) Weight change:    PE:abd soft  Lab Results: Results for orders placed during the hospital encounter of 01/05/13 (from the past 24 hour(s))  CBC     Status: Abnormal   Collection Time    01/05/13 11:55 AM      Result Value Range   WBC 4.4  4.0 - 10.5 K/uL   RBC 3.54 (*) 4.22 - 5.81 MIL/uL   Hemoglobin 10.1 (*) 13.0 - 17.0 g/dL   HCT 16.1 (*) 09.6 - 04.5 %   MCV 81.4  78.0 - 100.0 fL   MCH 28.5  26.0 - 34.0 pg   MCHC 35.1  30.0 - 36.0 g/dL   RDW 40.9  81.1 - 91.4 %   Platelets 64 (*) 150 - 400 K/uL  COMPREHENSIVE METABOLIC PANEL     Status: Abnormal   Collection Time    01/05/13 11:55 AM      Result Value Range   Sodium 136  135 - 145 mEq/L   Potassium 4.2  3.5 - 5.1 mEq/L   Chloride 104  96 - 112 mEq/L   CO2 23  19 - 32 mEq/L   Glucose, Bld 85  70 - 99 mg/dL   BUN 13  6 - 23 mg/dL   Creatinine, Ser 7.82  0.50 - 1.35 mg/dL   Calcium 8.9  8.4 - 95.6 mg/dL   Total Protein 7.2  6.0 - 8.3 g/dL   Albumin 3.2 (*) 3.5 - 5.2 g/dL   AST 25  0 - 37 U/L   ALT 7  0 - 53 U/L   Alkaline Phosphatase 66  39 - 117 U/L   Total Bilirubin 0.3  0.3 - 1.2 mg/dL   GFR calc non Af Amer 52 (*) >90 mL/min   GFR calc Af Amer 60 (*) >90 mL/min  TYPE AND SCREEN     Status: None   Collection Time    01/05/13 11:55 AM      Result Value Range   ABO/RH(D) A POS     Antibody Screen POS     Sample Expiration 01/08/2013     Antibody Identification WARM AUTOANTIBODY     DAT, IgG POS     Antibody ID,T [place] NO SPECIFIC ANTIBODY DEMONSTRATED IN  ELUATE    LACTIC ACID, PLASMA     Status: None   Collection Time    01/05/13 11:55 AM      Result Value Range   Lactic Acid, Venous 1.1  0.5 - 2.2 mmol/L  LIPASE, BLOOD     Status: None   Collection Time    01/05/13 11:55 AM      Result Value Range   Lipase 35  11 - 59 U/L  APTT     Status: None   Collection Time    01/05/13 11:55 AM      Result Value Range   aPTT 35  24 - 37 seconds  PROTIME-INR     Status: None   Collection Time  01/05/13 11:55 AM      Result Value Range   Prothrombin Time 14.6  11.6 - 15.2 seconds   INR 1.16  0.00 - 1.49  OCCULT BLOOD, POC DEVICE     Status: Abnormal   Collection Time    01/05/13 12:17 PM      Result Value Range   Fecal Occult Bld POSITIVE (*) NEGATIVE  URINALYSIS, ROUTINE W REFLEX MICROSCOPIC     Status: Abnormal   Collection Time    01/05/13  1:18 PM      Result Value Range   Color, Urine YELLOW  YELLOW   APPearance CLEAR  CLEAR   Specific Gravity, Urine 1.012  1.005 - 1.030   pH 6.0  5.0 - 8.0   Glucose, UA NEGATIVE  NEGATIVE mg/dL   Hgb urine dipstick SMALL (*) NEGATIVE   Bilirubin Urine NEGATIVE  NEGATIVE   Ketones, ur NEGATIVE  NEGATIVE mg/dL   Protein, ur NEGATIVE  NEGATIVE mg/dL   Urobilinogen, UA 1.0  0.0 - 1.0 mg/dL   Nitrite NEGATIVE  NEGATIVE   Leukocytes, UA NEGATIVE  NEGATIVE  URINE MICROSCOPIC-ADD ON     Status: None   Collection Time    01/05/13  1:18 PM      Result Value Range   Squamous Epithelial / LPF RARE  RARE   RBC / HPF 0-2  <3 RBC/hpf  MAGNESIUM     Status: None   Collection Time    01/05/13  4:13 PM      Result Value Range   Magnesium 1.8  1.5 - 2.5 mg/dL  CBC     Status: Abnormal   Collection Time    01/05/13  4:22 PM      Result Value Range   WBC 4.5  4.0 - 10.5 K/uL   RBC 3.30 (*) 4.22 - 5.81 MIL/uL   Hemoglobin 9.3 (*) 13.0 - 17.0 g/dL   HCT 16.1 (*) 09.6 - 04.5 %   MCV 80.9  78.0 - 100.0 fL   MCH 28.2  26.0 - 34.0 pg   MCHC 34.8  30.0 - 36.0 g/dL   RDW 40.9  81.1 - 91.4 %    Platelets 63 (*) 150 - 400 K/uL  CBC     Status: Abnormal   Collection Time    01/05/13 10:45 PM      Result Value Range   WBC 4.8  4.0 - 10.5 K/uL   RBC 2.99 (*) 4.22 - 5.81 MIL/uL   Hemoglobin 8.6 (*) 13.0 - 17.0 g/dL   HCT 78.2 (*) 95.6 - 21.3 %   MCV 80.9  78.0 - 100.0 fL   MCH 28.8  26.0 - 34.0 pg   MCHC 35.5  30.0 - 36.0 g/dL   RDW 08.6  57.8 - 46.9 %   Platelets 52 (*) 150 - 400 K/uL  BASIC METABOLIC PANEL     Status: Abnormal   Collection Time    01/06/13  5:11 AM      Result Value Range   Sodium 137  135 - 145 mEq/L   Potassium 3.4 (*) 3.5 - 5.1 mEq/L   Chloride 107  96 - 112 mEq/L   CO2 21  19 - 32 mEq/L   Glucose, Bld 77  70 - 99 mg/dL   BUN 13  6 - 23 mg/dL   Creatinine, Ser 6.29  0.50 - 1.35 mg/dL   Calcium 8.4  8.4 - 52.8 mg/dL   GFR calc non Af Amer 62 (*) >  90 mL/min   GFR calc Af Amer 71 (*) >90 mL/min  CBC     Status: Abnormal   Collection Time    01/06/13  5:11 AM      Result Value Range   WBC 4.6  4.0 - 10.5 K/uL   RBC 2.95 (*) 4.22 - 5.81 MIL/uL   Hemoglobin 8.7 (*) 13.0 - 17.0 g/dL   HCT 16.1 (*) 09.6 - 04.5 %   MCV 81.4  78.0 - 100.0 fL   MCH 29.5  26.0 - 34.0 pg   MCHC 36.3 (*) 30.0 - 36.0 g/dL   RDW 40.9  81.1 - 91.4 %   Platelets 59 (*) 150 - 400 K/uL    Studies/Results: No results found.    Assessment: Prob postpolypectomy bleed, seems to have subsided  Plan: Cont to monitor stools and Hb . Advance to full liquids, hopefully avoid colonoscopy    Senaya Dicenso C 01/06/2013, 9:23 AM

## 2013-01-06 NOTE — Progress Notes (Signed)
[facility] PROGRESS NOTE  [name] OZH:[id number] DOB: Feb 22, 1935 DOA: 01/05/2013 PCP: Oliver Barre, MD  Assessment/Plan:  1-lower GI bleed  Patient with recent colonoscopy with polypectomy. Likely secondary to recent colonoscopy with polypectomy in the setting of thrombocytopenia. Hemoglobin was at 11.7 2 days prior to admission.   No further bleeding, HB stable at 8.7. Continue to Monitor HB and for bleed. Advance diet to full liquid.   2 acute blood loss anemia/iron deficiency anemia  In setting GI bleed post polypectomy. Hb stable.   3 thrombocytopenia  Patient does have a history of thrombocytopenia which is felt to be secondary to a platelet function defect and also does have a history of hemolytic anemia. Platelet count today 59.  Platelet stable.   4 sinus bradycardia  Patient does have a history of sinus bradycardia. Heart rate increases with movement. Patient currently asymptomatic.  5 hypertension  Continue with Norvasc.   6 prophylaxis  SCDs for DVT prophylaxis. No anticoagulation in setting of GI bleed.     Code Status: Full Code.  Family Communication: Care discussed with patient.  Disposition Plan: Home in 1 or 2 days.    Consultants:  Dr Madilyn Fireman.   Procedures:  none  Antibiotics:  None  HPI/Subjective: Feeling well, tolerating clear diet.  Last Bloody BM was yesterday at 7 Pm.   Objective: Filed Vitals:   01/05/13 1505 01/05/13 1700 01/05/13 2203 01/06/13 0607  BP: 145/78 144/72 130/71 142/81  Pulse:  44 50 63  Temp: 97.9 F (36.6 C) 97.6 F (36.4 C) 98 F (36.7 C) 98.2 F (36.8 C)  TempSrc:  Oral Oral Oral  Resp: 17 18 16 16   Height:      Weight:    57.153 kg (126 lb)  SpO2: 100% 100% 100% 99%    Intake/Output Summary (Last 24 hours) at 01/06/13 0955 Last data filed at 01/06/13 0801  Gross per 24 hour  Intake  462.5 ml  Output   1002 ml  Net -539.5 ml   Filed Weights   01/05/13 1023 01/06/13 0607  Weight: 57.153 kg (126  lb) 57.153 kg (126 lb)    Exam:   General: No distress.   Cardiovascular: S 1, S 2 RRR  Respiratory: CTA  Abdomen: BS present, soft, NT  Musculoskeletal: No edema.   Data Reviewed: Basic Metabolic Panel:  Recent Labs Lab 01/03/13 0830 01/05/13 1155 01/05/13 1613 01/06/13 0511  NA 138 136  --  137  K 3.5 4.2  --  3.4*  CL 99 104  --  107  CO2 25 23  --  21  GLUCOSE 81 85  --  77  BUN 14 13  --  13  CREATININE 1.46* 1.30  --  1.13  CALCIUM 9.8 8.9  --  8.4  MG  --   --  1.8  --    Liver Function Tests:  Recent Labs Lab 01/05/13 1155  AST 25  ALT 7  ALKPHOS 66  BILITOT 0.3  PROT 7.2  ALBUMIN 3.2*    Recent Labs Lab 01/05/13 1155  LIPASE 35   No results found for this basename: AMMONIA,  in the last 168 hours CBC:  Recent Labs Lab 01/03/13 0830 01/05/13 1155 01/05/13 1622 01/05/13 2245 01/06/13 0511  WBC 5.2 4.4 4.5 4.8 4.6  HGB 11.7* 10.1* 9.3* 8.6* 8.7*  HCT 33.6* 28.8* 26.7* 24.2* 24.0*  MCV 81.4 81.4 80.9 80.9 81.4  PLT 73* 64* 63* 52* 59*   Cardiac Enzymes: No  results found for this basename: CKTOTAL, CKMB, CKMBINDEX, TROPONINI,  in the last 168 hours BNP (last 3 results) No results found for this basename: PROBNP,  in the last 8760 hours CBG: No results found for this basename: GLUCAP,  in the last 168 hours  No results found for this or any previous visit (from the past 240 hour(s)).   Studies: No results found.  Scheduled Meds: . amLODipine  5 mg Oral QAC breakfast  . atorvastatin  10 mg Oral q morning - 10a  . ferrous sulfate  325 mg Oral Q breakfast  . polyethylene glycol  17 g Oral TID  . potassium chloride  40 mEq Oral Once  . sodium chloride  1,000 mL Intravenous Once  . sodium chloride  3 mL Intravenous Q12H   Continuous Infusions: . sodium chloride 75 mL/hr at 01/06/13 0458    Principal Problem:   Lower GI bleed Active Problems:   COPD (chronic obstructive pulmonary disease)   Iron deficiency anemia    Bradycardia, sinus   HTN (hypertension)   Hyperlipidemia   Colon polyps   Acute blood loss anemia    Time spent: 35 minutes.     [name]  [facility] Pager [phone]. If 7PM-7AM, please contact night-coverage at [email or web address], password [facility] 01/06/2013, 9:55 AM  LOS: 1 day

## 2013-01-06 NOTE — Progress Notes (Signed)
Per NT, pt had small liquid bowel movement that was mostly bright red blood. Will continue to monitor. Donielle Kaigler, Lavone Orn, RN

## 2013-01-07 ENCOUNTER — Encounter (HOSPITAL_COMMUNITY)
Admission: EM | Disposition: A | Discharge status: Home / Self Care | Payer: Self-pay | Source: Home / Self Care | Attending: Internal Medicine

## 2013-01-07 HISTORY — PX: FLEXIBLE SIGMOIDOSCOPY: SHX5431

## 2013-01-07 LAB — CBC
HCT: 23.6 % — ABNORMAL LOW (ref 39.0–52.0)
MCH: 28.4 pg (ref 26.0–34.0)
MCH: 29 pg (ref 26.0–34.0)
MCHC: 35.2 g/dL (ref 30.0–36.0)
MCHC: 35.7 g/dL (ref 30.0–36.0)
MCV: 81 fL (ref 78.0–100.0)
Platelets: 55 10*3/uL — ABNORMAL LOW (ref 150–400)
Platelets: 71 10*3/uL — ABNORMAL LOW (ref 150–400)
RBC: 3.1 MIL/uL — ABNORMAL LOW (ref 4.22–5.81)
RBC: 2.93 MIL/uL — ABNORMAL LOW (ref 4.22–5.81)
RDW: 14.1 % (ref 11.5–15.5)
RDW: 13.9 % (ref 11.5–15.5)
RDW: 13.9 % (ref 11.5–15.5)

## 2013-01-07 SURGERY — SIGMOIDOSCOPY, FLEXIBLE
Anesthesia: Moderate Sedation

## 2013-01-07 MED ORDER — POLYETHYLENE GLYCOL 3350 17 GM/SCOOP PO POWD
1.0000 | Freq: Once | ORAL | Status: AC
  Administered 2013-01-07: 1 via ORAL
  Filled 2013-01-07: qty 255

## 2013-01-07 MED ORDER — MIDAZOLAM HCL 5 MG/5ML IJ SOLN
INTRAMUSCULAR | Status: DC | PRN
  Administered 2013-01-07 (×2): 2 mg via INTRAVENOUS

## 2013-01-07 MED ORDER — SODIUM CHLORIDE 0.9 % IV SOLN: INTRAVENOUS | Status: DC

## 2013-01-07 MED ORDER — MIDAZOLAM HCL 10 MG/2ML IJ SOLN
INTRAMUSCULAR | Status: AC
  Filled 2013-01-07: qty 2

## 2013-01-07 MED ORDER — FENTANYL CITRATE 0.05 MG/ML IJ SOLN
INTRAMUSCULAR | Status: DC | PRN
  Administered 2013-01-07: 15 ug via INTRAVENOUS
  Administered 2013-01-07: 25 ug via INTRAVENOUS
  Administered 2013-01-07: 10 ug via INTRAVENOUS

## 2013-01-07 MED ORDER — SODIUM CHLORIDE 0.9 % IV SOLN
INTRAVENOUS | Status: DC
  Administered 2013-01-08: 500 mL via INTRAVENOUS

## 2013-01-07 MED ORDER — FENTANYL CITRATE 0.05 MG/ML IJ SOLN
INTRAMUSCULAR | Status: AC
  Filled 2013-01-07: qty 2

## 2013-01-07 NOTE — Progress Notes (Signed)
Addendum: I attempted a flexible sigmoidoscopy for localization of possible rectal polypectomy site after 1 Fleet enema. However the prep was very poor with a lot of adherent old blood clots in stool and I could not achieve adequate visualization. I could not see to advance the scope beyond the rectosigmoid and could not find the rectal polypectomy site. I decided to terminate the procedure and go ahead with full prep for colonoscopy tomorrow unless he stops bleeding.

## 2013-01-07 NOTE — Progress Notes (Signed)
[facility] PROGRESS NOTE  [name] ZOX:[id number] DOB: April 03, 1935 DOA: 01/05/2013 PCP: Oliver Barre, MD  Assessment/Plan:  1-lower GI bleed  Patient with recent colonoscopy with polypectomy. Likely secondary to recent colonoscopy with polypectomy in the setting of thrombocytopenia. Hemoglobin was at 11.7 2 days prior to admission.   Patient had 3 episode of bloody stool yesterday and one this morning. HB stable 8.8 range.  Patient had Sigmoidoscopy today non diagnostic. Plan for colonoscopy tomorrow.  Follow Hb trend. Transfusion as needed.   2 Acute blood loss anemia/iron deficiency anemia  In setting GI bleed post polypectomy. Hb stable.   3 Thrombocytopenia  Patient does have a history of thrombocytopenia which is felt to be secondary to a platelet function defect and also does have a history of hemolytic anemia. Platelet count today 55.  Platelet stable.   4 sinus bradycardia  Patient does have a history of sinus bradycardia. Heart rate increases with movement. Patient currently asymptomatic.  5 hypertension  Hold Norvasc in setting of bleeding.   6 prophylaxis  SCDs for DVT prophylaxis. No anticoagulation in setting of GI bleed.     Code Status: Full Code.  Family Communication: Care discussed with patient.  Disposition Plan: Home in 1 or 2 days.    Consultants:  Dr Madilyn Fireman.   Procedures:  none  Antibiotics:  None  HPI/Subjective: Had 3 bloody BM yesterday and one this morning. No abdominal pain.   Objective: Filed Vitals:   01/07/13 1230 01/07/13 1231 01/07/13 1245 01/07/13 1251  BP: 117/62  89/43 93/48  Pulse: 55  52 52  Temp:  98.2 F (36.8 C)    TempSrc:  Oral    Resp: 27  18 23   Height:      Weight:      SpO2: 100%  98% 96%    Intake/Output Summary (Last 24 hours) at 01/07/13 1301 Last data filed at 01/07/13 1159  Gross per 24 hour  Intake 2597.5 ml  Output   2527 ml  Net   70.5 ml   Filed Weights   01/05/13 1023 01/06/13 0607  01/07/13 0534  Weight: 57.153 kg (126 lb) 57.153 kg (126 lb) 56.7 kg (125 lb)    Exam:   General: No distress.   Cardiovascular: S 1, S 2 RRR  Respiratory: CTA  Abdomen: BS present, soft, NT  Musculoskeletal: No edema.   Data Reviewed: Basic Metabolic Panel:  Recent Labs Lab 01/03/13 0830 01/05/13 1155 01/05/13 1613 01/06/13 0511  NA 138 136  --  137  K 3.5 4.2  --  3.4*  CL 99 104  --  107  CO2 25 23  --  21  GLUCOSE 81 85  --  77  BUN 14 13  --  13  CREATININE 1.46* 1.30  --  1.13  CALCIUM 9.8 8.9  --  8.4  MG  --   --  1.8  --    Liver Function Tests:  Recent Labs Lab 01/05/13 1155  AST 25  ALT 7  ALKPHOS 66  BILITOT 0.3  PROT 7.2  ALBUMIN 3.2*    Recent Labs Lab 01/05/13 1155  LIPASE 35   No results found for this basename: AMMONIA,  in the last 168 hours CBC:  Recent Labs Lab 01/05/13 2245 01/06/13 0511 01/06/13 1313 01/06/13 2040 01/07/13 0505  WBC 4.8 4.6 4.7 4.5 4.3  HGB 8.6* 8.7* 9.1* 8.2* 8.8*  HCT 24.2* 24.0* 26.0* 23.4* 25.1*  MCV 80.9 81.4 81.5 81.5 81.0  PLT 52* 59* 57* 62* 55*   Cardiac Enzymes: No results found for this basename: CKTOTAL, CKMB, CKMBINDEX, TROPONINI,  in the last 168 hours BNP (last 3 results) No results found for this basename: PROBNP,  in the last 8760 hours CBG: No results found for this basename: GLUCAP,  in the last 168 hours  No results found for this or any previous visit (from the past 240 hour(s)).   Studies: No results found.  Scheduled Meds: . atorvastatin  10 mg Oral q morning - 10a  . ferrous sulfate  325 mg Oral Q breakfast  . polyethylene glycol powder  1 Container Oral Once  . sodium chloride  1,000 mL Intravenous Once  . sodium chloride  3 mL Intravenous Q12H   Continuous Infusions: . sodium chloride 75 mL/hr at 01/07/13 1120  . sodium chloride      Principal Problem:   Lower GI bleed Active Problems:   COPD (chronic obstructive pulmonary disease)   Iron deficiency  anemia   Bradycardia, sinus   HTN (hypertension)   Hyperlipidemia   Colon polyps   Acute blood loss anemia    Time spent: 35 minutes.     REGALADO,BELKYS  [facility] Pager [phone]. If 7PM-7AM, please contact night-coverage at [email or web address], password [facility] 01/07/2013, 1:01 PM  LOS: 2 days

## 2013-01-07 NOTE — Op Note (Signed)
[facility] [address] [place], [address]   FLEXIBLE SIGMOIDOSCOPY PROCEDURE REPORT  PATIENT: [name], [name]  MR#: [id number] BIRTHDATE: [birth year] , 77  yrs. old GENDER: Male ENDOSCOPIST: Dorena Cookey, MD REFERRED BY: PROCEDURE DATE:  01/07/2013 PROCEDURE: ASA CLASS: INDICATIONS: rectal bleeding, suspected post polypectomy bleed MEDICATIONS: fentanyl 50 mcg, Versed 4 mg  DESCRIPTION OF PROCEDURE:   scope advanced into the rectum where there was a lot of old adherent blood and bloody fluid. This would not lavage easily with the foot petal lavage. This situation persisted up to the rectosigmoid junction where visualization was similarly poor. I tried to find the polypectomy site from recent colonoscopy in the rectum but could not localize it nor any obvious active oozing in the rectum or rectosigmoid. I decided that the prep was too poor to localize bleeding and will give oral prep for full colonoscopy        COMPLICATIONS:  None  ENDOSCOPIC IMPRESSION: Limited flexible sigmoidoscopy with old blood in the rectum and rectosigmoid, inadequate visualization to be diagnostic for source of bleeding  RECOMMENDATIONS:  full colon prep and probable colonoscopy tomorrow unless bleeding stops.   _______________________________ Rosalie DoctorDorena Cookey, MD 01/07/2013 12:31 PM

## 2013-01-07 NOTE — Progress Notes (Addendum)
[facility] Progress Note  Subjective: Went  all day yesterday after 1:00 with no bowel movements but states he had a fairly large bloody bowel movement today Objective: Vital signs in last 24 hours: Temp:  [97.8 F (36.6 C)-98.4 F (36.9 C)] 98.1 F (36.7 C) (07/13 0534) Pulse Rate:  [54-68] 63 (07/13 0534) Resp:  [18-20] 18 (07/13 0534) BP: (125-146)/(70-76) 125/76 mmHg (07/13 0534) SpO2:  [96 %-100 %] 96 % (07/13 0534) Weight:  [56.7 kg (125 lb)] 56.7 kg (125 lb) (07/13 0534) Weight change: -0.454 kg (-1 lb)   PE: Abdomen soft nondistended  Lab Results: Results for orders placed during the hospital encounter of 01/05/13 (from the past 24 hour(s))  CBC     Status: Abnormal   Collection Time    01/06/13  1:13 PM      Result Value Range   WBC 4.7  4.0 - 10.5 K/uL   RBC 3.19 (*) 4.22 - 5.81 MIL/uL   Hemoglobin 9.1 (*) 13.0 - 17.0 g/dL   HCT 09.8 (*) 11.9 - 14.7 %   MCV 81.5  78.0 - 100.0 fL   MCH 28.5  26.0 - 34.0 pg   MCHC 35.0  30.0 - 36.0 g/dL   RDW 82.9  56.2 - 13.0 %   Platelets 57 (*) 150 - 400 K/uL  CBC     Status: Abnormal   Collection Time    01/06/13  8:40 PM      Result Value Range   WBC 4.5  4.0 - 10.5 K/uL   RBC 2.87 (*) 4.22 - 5.81 MIL/uL   Hemoglobin 8.2 (*) 13.0 - 17.0 g/dL   HCT 86.5 (*) 78.4 - 69.6 %   MCV 81.5  78.0 - 100.0 fL   MCH 28.6  26.0 - 34.0 pg   MCHC 35.0  30.0 - 36.0 g/dL   RDW 29.5  28.4 - 13.2 %   Platelets 62 (*) 150 - 400 K/uL  CBC     Status: Abnormal   Collection Time    01/07/13  5:05 AM      Result Value Range   WBC 4.3  4.0 - 10.5 K/uL   RBC 3.10 (*) 4.22 - 5.81 MIL/uL   Hemoglobin 8.8 (*) 13.0 - 17.0 g/dL   HCT 44.0 (*) 10.2 - 72.5 %   MCV 81.0  78.0 - 100.0 fL   MCH 28.4  26.0 - 34.0 pg   MCHC 35.1  30.0 - 36.0 g/dL   RDW 36.6  44.0 - 34.7 %   Platelets 55 (*) 150 - 400 K/uL    Studies/Results: No results found.    Assessment: Polypectomy bleed with recent polypectomies done in the descending colon  and rectosigmoid. Thrombocytopenia  Plan: Unclear whether the blood he is passing his old. At this point will prep with MiraLAX and see if it clears. Tentatively schedule for colonoscopy tomorrow morning. Continue every 8 hour hemoglobins.    Tearia Gibbs C 01/07/2013, 9:47 AM

## 2013-01-08 ENCOUNTER — Encounter (HOSPITAL_COMMUNITY): Payer: Self-pay | Admitting: *Deleted

## 2013-01-08 ENCOUNTER — Encounter (HOSPITAL_COMMUNITY)
Admission: EM | Disposition: A | Discharge status: Home / Self Care | Payer: Self-pay | Source: Home / Self Care | Attending: Internal Medicine

## 2013-01-08 HISTORY — PX: COLONOSCOPY: SHX5424

## 2013-01-08 LAB — BASIC METABOLIC PANEL
BUN: 13 mg/dL (ref 6–23)
Calcium: 8.6 mg/dL (ref 8.4–10.5)
GFR calc non Af Amer: 57 mL/min — ABNORMAL LOW (ref >90)
Glucose, Bld: 81 mg/dL (ref 70–99)
Potassium: 3.5 mEq/L (ref 3.5–5.1)

## 2013-01-08 LAB — CBC
HCT: 21.7 % — ABNORMAL LOW (ref 39.0–52.0)
Hemoglobin: 7.8 g/dL — ABNORMAL LOW (ref 13.0–17.0)
MCH: 29.1 pg (ref 26.0–34.0)
MCHC: 35.9 g/dL (ref 30.0–36.0)
MCV: 81 fL (ref 78.0–100.0)

## 2013-01-08 SURGERY — COLONOSCOPY
Anesthesia: Moderate Sedation

## 2013-01-08 MED ORDER — MIDAZOLAM HCL 5 MG/5ML IJ SOLN
INTRAMUSCULAR | Status: DC | PRN
  Administered 2013-01-08: 2 mg via INTRAVENOUS
  Administered 2013-01-08 (×5): 1 mg via INTRAVENOUS

## 2013-01-08 MED ORDER — FENTANYL CITRATE 0.05 MG/ML IJ SOLN
INTRAMUSCULAR | Status: DC | PRN
  Administered 2013-01-08 (×2): 25 ug via INTRAVENOUS
  Administered 2013-01-08 (×2): 12.5 ug via INTRAVENOUS

## 2013-01-08 MED ORDER — SODIUM CHLORIDE 0.9 % IJ SOLN
PREFILLED_SYRINGE | INTRAMUSCULAR | Status: DC | PRN
  Administered 2013-01-08: 09:00:00

## 2013-01-08 MED ORDER — POLYETHYLENE GLYCOL 3350 17 G PO PACK
17.0000 g | PACK | Freq: Four times a day (QID) | ORAL | Status: AC
  Administered 2013-01-08 (×4): 17 g via ORAL
  Filled 2013-01-08 (×3): qty 1

## 2013-01-08 MED ORDER — FENTANYL CITRATE 0.05 MG/ML IJ SOLN
INTRAMUSCULAR | Status: AC
  Filled 2013-01-08: qty 2

## 2013-01-08 MED ORDER — MIDAZOLAM HCL 10 MG/2ML IJ SOLN
INTRAMUSCULAR | Status: AC
  Filled 2013-01-08: qty 2

## 2013-01-08 NOTE — Progress Notes (Signed)
[facility] PROGRESS NOTE  [name] ZOX:[id number] DOB: August 03, 1934 DOA: 01/05/2013 PCP: Oliver Barre, MD  Assessment/Plan:  1-Lower GI bleed: Post polypectomy.  Patient with recent colonoscopy with polypectomy. Likely secondary to recent colonoscopy with polypectomy in the setting of thrombocytopenia. Hemoglobin was at 11.7 2 days prior to admission.   Patient had Sigmoidoscopy today non diagnostic 7-13.  Will transfuse one unit of PRBC and 1 unit of platelet.  Patient had colonoscopy 7-14: Which show active oozing with recent significant bleeding from polypectomy site just above the cecum. Patient had  injection of epinephrine and application of a clip on site of prior polypectomy.   2 Acute blood loss anemia/iron deficiency anemia  In setting GI bleed post polypectomy. Hb stable.   3 Thrombocytopenia  Patient does have a history of thrombocytopenia which is felt to be secondary to a platelet function defect and also does have a history of hemolytic anemia. Platelet count today 55.  Will transfuse one unit to help with homeostasis.   4 Sinus bradycardia  Patient does have a history of sinus bradycardia. Heart rate increases with movement. Patient currently asymptomatic.  5 hypertension  Hold Norvasc in setting of bleeding.   6 prophylaxis  SCDs for DVT prophylaxis. No anticoagulation in setting of GI bleed.     Code Status: Full Code.  Family Communication: Care discussed with patient.  Disposition Plan: Home in 1 or 2 days.    Consultants:  Dr Madilyn Fireman.   Procedures:  none  Antibiotics:  None  HPI/Subjective: Feeling well, lost 4 pounds since admission, Wants to go home tomorrow.  Denies abdominal pain, no more bloody BM after colonoscopy.   Objective: Filed Vitals:   01/08/13 1030 01/08/13 1040 01/08/13 1050 01/08/13 1055  BP: 151/102 152/84 146/94 152/84  Pulse:      Temp:      TempSrc:      Resp: 14 16 20 13   Height:      Weight:      SpO2: 98%  100% 98% 99%    Intake/Output Summary (Last 24 hours) at 01/08/13 1124 Last data filed at 01/08/13 1000  Gross per 24 hour  Intake    516 ml  Output    959 ml  Net   -443 ml   Filed Weights   01/06/13 0607 01/07/13 0534 01/08/13 0645  Weight: 57.153 kg (126 lb) 56.7 kg (125 lb) 55.2 kg (121 lb 11.1 oz)    Exam:   General: No distress.   Cardiovascular: S 1, S 2 RRR  Respiratory: CTA  Abdomen: BS present, soft, NT  Musculoskeletal: No edema.   Data Reviewed: Basic Metabolic Panel:  Recent Labs Lab 01/03/13 0830 01/05/13 1155 01/05/13 1613 01/06/13 0511 01/08/13 0503  NA 138 136  --  137 137  K 3.5 4.2  --  3.4* 3.5  CL 99 104  --  107 104  CO2 25 23  --  21 23  GLUCOSE 81 85  --  77 81  BUN 14 13  --  13 13  CREATININE 1.46* 1.30  --  1.13 1.21  CALCIUM 9.8 8.9  --  8.4 8.6  MG  --   --  1.8  --   --    Liver Function Tests:  Recent Labs Lab 01/05/13 1155  AST 25  ALT 7  ALKPHOS 66  BILITOT 0.3  PROT 7.2  ALBUMIN 3.2*    Recent Labs Lab 01/05/13 1155  LIPASE 35   No results  found for this basename: AMMONIA,  in the last 168 hours CBC:  Recent Labs Lab 01/06/13 2040 01/07/13 0505 01/07/13 1452 01/07/13 2050 01/08/13 0503  WBC 4.5 4.3 4.4 4.6 3.5*  HGB 8.2* 8.8* 8.3* 8.5* 7.8*  HCT 23.4* 25.1* 23.6* 23.8* 21.7*  MCV 81.5 81.0 81.1 81.2 81.0  PLT 62* 55* 57* 71* 63*   Cardiac Enzymes: No results found for this basename: CKTOTAL, CKMB, CKMBINDEX, TROPONINI,  in the last 168 hours BNP (last 3 results) No results found for this basename: PROBNP,  in the last 8760 hours CBG: No results found for this basename: GLUCAP,  in the last 168 hours  No results found for this or any previous visit (from the past 240 hour(s)).   Studies: No results found.  Scheduled Meds: . atorvastatin  10 mg Oral q morning - 10a  . ferrous sulfate  325 mg Oral Q breakfast  . sodium chloride  1,000 mL Intravenous Once  . sodium chloride  3 mL  Intravenous Q12H   Continuous Infusions: . sodium chloride 500 mL (01/08/13 0749)    Principal Problem:   Lower GI bleed Active Problems:   COPD (chronic obstructive pulmonary disease)   Iron deficiency anemia   Bradycardia, sinus   HTN (hypertension)   Hyperlipidemia   Colon polyps   Acute blood loss anemia    Time spent: 25 minutes.     [name]  [facility] Pager [phone]. If 7PM-7AM, please contact night-coverage at [email or web address], password [facility] 01/08/2013, 11:24 AM  LOS: 3 days

## 2013-01-08 NOTE — Interval H&P Note (Signed)
History and Physical Interval Note:  01/08/2013 8:38 AM  [name]  has presented today for surgery, with the diagnosis of Rectal bleeding  The various methods of treatment have been discussed with the patient and family. After consideration of risks, benefits and other options for treatment, the patient has consented to  Procedure(s): COLONOSCOPY (N/A) as a surgical intervention .  The patient's history has been reviewed, patient examined, no change in status, stable for surgery.  I have reviewed the patient's chart and labs.  Questions were answered to the patient's satisfaction.     Florencia Reasons

## 2013-01-08 NOTE — Progress Notes (Signed)
OT Cancellation Note  Patient Details Name: [name] MRN: [id number] DOB: [birth year]   Cancelled Treatment:    Reason Eval/Treat Not Completed: Other (comment) (pt in process of nursing setting up platelet transfusion.) Will try later time.  [name] [phone] 01/08/2013, 12:44 PM

## 2013-01-08 NOTE — Evaluation (Signed)
Occupational Therapy Evaluation Patient Details Name: [name] MRN: [id number] DOB: 1935/06/18 Today's Date: 01/08/2013 Time: 4401-0272 OT Time Calculation (min): 15 min  OT Assessment / Plan / Recommendation History of present illness 78 yo male with recent colonoscopy admitted with 2 bloody bowel movement   Clinical Impression   Pt I with simple ADL activity    OT Assessment  Patient does not need any further OT services    Follow Up Recommendations  No OT follow up                Precautions / Restrictions Precautions Precaution Comments: pt getting blood       ADL  Upper Body Dressing: Performed;Independent Where Assessed - Upper Body Dressing: Unsupported sit to stand Toilet Transfer: Simulated;Independent Toilet Transfer Method: Sit to Barista: Regular height toilet Tub/Shower Transfer: Simulated;Independent Tub/Shower Transfer Method: Ambulating Transfers/Ambulation Related to ADLs: Pt I with all simple ADL activity. Pt states wife will A as needed.           Visit Information  Last OT Received On: 01/08/13 Assistance Needed: +1 Reason Eval/Treat Not Completed: Other (comment) (pt in process of nursing setting up platelet transfusion.) History of Present Illness: 78 yo male with recent colonoscopy admitted with 2 bloody bowel movement             Cognition  Cognition Arousal/Alertness: Awake/alert Behavior During Therapy: WFL for tasks assessed/performed Overall Cognitive Status: Within Functional Limits for tasks assessed    Extremity/Trunk Assessment Upper Extremity Assessment Upper Extremity Assessment: Overall WFL for tasks assessed     Mobility Bed Mobility Bed Mobility: Supine to Sit Supine to Sit: 7: Independent Transfers Sit to Stand: 7: Independent Stand to Sit: 7: Independent           End of Session OT - End of Session Activity Tolerance: Patient tolerated treatment well Patient left: in chair;with  call bell/phone within reach  GO     Geraldean Walen, Metro Kung 01/08/2013, 4:00 PM

## 2013-01-08 NOTE — Care Management Note (Addendum)
    Page 1 of 1   01/11/2013     1:20:16 PM   CARE MANAGEMENT NOTE 01/11/2013  Patient:  EDILSON, VITAL   Account Number:  [id number]  Date Initiated:  01/08/2013  Documentation initiated by:  Lanier Clam  Subjective/Objective Assessment:   ADMITTED W/LWR GIB.     Action/Plan:   FROM HOME.   Anticipated DC Date:  01/11/2013   Anticipated DC Plan:  HOME/SELF CARE      DC Planning Services  CM consult      Choice offered to / List presented to:             Status of service:  Completed, signed off Medicare Important Message given?   (If response is "NO", the following Medicare IM given date fields will be blank) Date Medicare IM given:   Date Additional Medicare IM given:    Discharge Disposition:  HOME/SELF CARE  Per UR Regulation:  Reviewed for med. necessity/level of care/duration of stay  If discussed at Long Length of Stay Meetings, dates discussed:   01/11/2013    Comments:  01/11/13 Travonna Swindle RN,BSN NCM [phone] D/C HOME NO ORDERS OR NEEDS.  01/08/13 Dominyk Law RN,BSN NCM [phone]

## 2013-01-08 NOTE — Op Note (Signed)
[facility] [address] [place], [address]   COLONOSCOPY PROCEDURE REPORT  PATIENT: [name], [name]  MR#: [id number] BIRTHDATE: 1934-11-12 , 77  yrs. old GENDER: Male ENDOSCOPIST: Bernette Redbird, MD REFERRED BY:   Dr. Oliver Barre, Dr. Darrold Span PROCEDURE DATE:  01/08/2013 PROCEDURE:     colonoscopy with control of hemorrhage ASA CLASS: INDICATIONS:  post polypectomy bleeding in a patient with moderate thrombocytopenia MEDICATIONS:    fentanyl 75 mcg, Versed 6 mg IV, epinephrine 1:10,000 (4 mL submucosally)  DESCRIPTION OF PROCEDURE: the patient was familiar with the procedure of colonoscopy and was brought from his hospital room at [facility] to the endoscopy unit following a NuLytely prep.  He had provided written consent.  After appropriate time out, he was sedated with the above medication prior to and during the course of the procedure without clinical instability.  The Pentax pediatric video colonoscope was advanced with some difficulty to the cecum, due to looping when I was right above the cecum. This was overcome by having the patient in the supine position and applying external abdominal compression.  During insertion of the scope, fairly fresh red blood was puddled and coating the colonic mucosa throughout the entire colon. I came upon the area of his large polyp removal from 5 days ago, as evidenced by a tattoo, but there was blood proximal to this so I kept advancing the scope. The cecum was identified by visualization of the appendiceal orifice.  Just above the cecum, contralateral to the ileal cecal valve, was a roughly 2.5 cm adherent clot with some active oozing but no brisk bleeding. The base of this area was injected with a total of about 4 mL of 1:10,000 epinephrine, after which a clip was applied to the base of the clot. After doing this, there was complete hemostasis. Note that the patient had had a couple of small (under 1 cm)  polyps removed by hot snare at the time of his colonoscopy 5 days ago.  During pullout, I examined the area of his tattoo which was near the splenic flexure in the distal transverse colon, and could not identify any eschar site, and certainly no evidence of clot or bleeding in that area.  Patient had a few medium-size sigmoid diverticula.  The patient tolerated this procedure well. There were no apparent complications.     COMPLICATIONS: None  ENDOSCOPIC IMPRESSION:  1. Active oozing with recent significant bleeding from polypectomy site just above the cecum 2. Successful hemostasis by injection of epinephrine and application of a clip 3. The site of his major polypectomy, ironically, looked intact without any evidence of a bleeding site in that area. 4. Sigmoid diverticulosis, felt to be an incidental finding  RECOMMENDATIONS:  1. I have discussed the case with the patient's attending physician, Dr. Sunnie Nielsen, and recommended consideration of a platelet transfusion to help secure sustained hemostasis. 2. I would also consider giving this patient a unit of blood since his hemoglobin this morning was below 8, and he is at some risk for further bleeding and has multiple medical comorbidities   _______________________________ eSignedBernette Redbird, MD 01/08/2013 10:05 AM     PATIENT NAME:  [name], [name] MR#: [id number]

## 2013-01-08 NOTE — Progress Notes (Signed)
Colonoscopy findings discussed with Dr. Sunnie Nielsen.  The patient appeared to have gradual oozing from a polypectomy site near the cecum, ironically not from the site of his large polyp removed from the distal transverse colon. There was quite a bit of blood scattered throughout the colon. Hemostasis was achieved after injection of epinephrine and application of a clip.  I have recommended transfusion of blood, also if possible transfusion of platelets to help secure hemostasis, and maintenance of a clear liquid diet and MiraLAX for the rest of the day to help clear out any residual blood and monitor whether ongoing bleeding is present, to help [place] decide whether repeat treatment tomorrow might be needed.  Florencia Reasons, M.D. [phone]

## 2013-01-09 ENCOUNTER — Encounter (HOSPITAL_COMMUNITY): Payer: Self-pay | Admitting: Gastroenterology

## 2013-01-09 DIAGNOSIS — E43 Unspecified severe protein-calorie malnutrition: Secondary | ICD-10-CM | POA: Insufficient documentation

## 2013-01-09 LAB — TYPE AND SCREEN
Antibody Screen: POSITIVE
DAT, IgG: POSITIVE
Unit division: 0

## 2013-01-09 LAB — CBC
Hemoglobin: 8.9 g/dL — ABNORMAL LOW (ref 13.0–17.0)
MCHC: 34.6 g/dL (ref 30.0–36.0)
RDW: 14.6 % (ref 11.5–15.5)

## 2013-01-09 LAB — PREPARE PLATELET PHERESIS

## 2013-01-09 MED ORDER — ENSURE COMPLETE PO LIQD
237.0000 mL | Freq: Two times a day (BID) | ORAL | Status: DC
  Administered 2013-01-09 – 2013-01-11 (×4): 237 mL via ORAL

## 2013-01-09 NOTE — Clinical Documentation Improvement (Signed)
THIS DOCUMENT IS NOT A PERMANENT PART OF THE MEDICAL RECORD  Please update your documentation with the medical record to reflect your response to this query. If you need help knowing how to do this please call [phone].  01/09/13   Dear Dr. Sunnie Nielsen, [facility],  In a better effort to capture your patient's severity of illness, reflect appropriate length of stay and utilization of resources, a review of the patient medical record has revealed the following indicators.    Based on your clinical judgment, please clarify and document in a progress note and/or discharge summary the clinical condition associated with the following supporting information:  In responding to this query please exercise your independent judgment.  The fact that a query is asked, does not imply that any particular answer is desired or expected.   Pt w/ Severe malnutrition per nutritional consult.  Clarification Needed   Please clarify if you agree pt severe malnutrition and document in pn or d/c summary    Possible Clinical Conditions?  _______Mild Malnutrition  _______Moderate Malnutrition _______Severe Malnutrition   _______Protein Calorie Malnutrition _______Severe Protein Calorie Malnutrition _______Emaciation  _______Cachexia   _______Other Condition________________ _______Cannot clinically determine     Supporting Information: Risk Factors:   Treatment  - Ensure Complete BID - Encouraged pt to consume high protein foods with every meal to preserve lean body mass  - Will continue to monitor   Nutrition Consult Pt meets criteria for severe MALNUTRITION in the context of chronic illness as evidenced by <75% estimated energy intake for months and pt with visible severe muscle wasting and subcutaneous fat loss in clavicles, upper arms, and thoracic/lumbar region.  DOCUMENTATION CODES Per approved criteria   -Severe malnutrition in the context of chronic illness -Underweight     Increased nutrient needs related to underweight as evidenced by BMI of 16.     You may use possible, probable, or suspect with inpatient documentation. possible, probable, suspected diagnoses MUST be documented at the time of discharge  Reviewed: additional documentation in the medical record  Thank You,  Enis Slipper  RN, BSN, MSN/Inf, CCDS Clinical Documentation Specialist [facility] Dept Pager: [phone] / E-mail: [email or web address]   [phone] Health Information Management [place]

## 2013-01-09 NOTE — Progress Notes (Signed)
Pt requesting to eat. Dr Matthias Hughs paged regarding possible repeat colonoscopy. Updated Dr Matthias Hughs on pt status, no repeat colonoscopy at this time. Heart healthy diet ordered. Will continue to monitor. Feven Alderfer, Lavone Orn, RN

## 2013-01-09 NOTE — Progress Notes (Signed)
[facility] PROGRESS NOTE  [name] ZOX:[id number] DOB: 11-27-1934 DOA: 01/05/2013 PCP: Oliver Barre, MD  Assessment/Plan:  1-Lower GI bleed: Post polypectomy.  Patient with recent colonoscopy with polypectomy. Likely secondary to recent colonoscopy with polypectomy in the setting of thrombocytopenia. Hemoglobin was at 11.7 2 days prior to admission.   Patient had Sigmoidoscopy on 7-13 which was not diagnostic.  Patient S/P  1 PRBC and 1 unit of platelet transfusion 7-14.  Patient had colonoscopy 7-14: Which show active oozing with recent significant bleeding from polypectomy site just above the cecum. Patient had  injection of epinephrine and application of a clip on site of prior polypectomy.  Diet advance 7-15. He had black stool.  Repeat Hb in am.   2 Acute blood loss anemia/iron deficiency anemia  In setting GI bleed post polypectomy. Hb stable.   3 Thrombocytopenia  Patient does have a history of thrombocytopenia which is felt to be secondary to a platelet function defect and also does have a history of hemolytic anemia. Platelet count today 55.  S/p transfusion of one unit to help with homeostasis.   4 Sinus bradycardia  Patient does have a history of sinus bradycardia. Heart rate increases with movement. Patient currently asymptomatic.  5 hypertension  Hold Norvasc in setting of bleeding.   6 prophylaxis  SCDs for DVT prophylaxis. No anticoagulation in setting of GI bleed.   7-Severe Malnutrition: Nutrition consulted.    Code Status: Full Code.  Family Communication: Care discussed with patient.  Disposition Plan: Home in 1 or 2 days.    Consultants:  Dr Madilyn Fireman.   Procedures:  none  Antibiotics:  None  HPI/Subjective: Feeling well, he was eating lunch. Just started regular diet. Had one BM this morning black color.   Objective: Filed Vitals:   01/08/13 1915 01/08/13 2140 01/09/13 0513 01/09/13 1341  BP: 141/77 149/82 113/61 102/61  Pulse: 60 59  68 65  Temp: 98.4 F (36.9 C) 98.6 F (37 C) 98.2 F (36.8 C) 98.6 F (37 C)  TempSrc: Oral Oral Oral Oral  Resp: 18 20 20 18   Height:      Weight:   55.157 kg (121 lb 9.6 oz)   SpO2:  100% 93% 98%    Intake/Output Summary (Last 24 hours) at 01/09/13 1605 Last data filed at 01/09/13 1237  Gross per 24 hour  Intake    550 ml  Output    200 ml  Net    350 ml   Filed Weights   01/07/13 0534 01/08/13 0645 01/09/13 0513  Weight: 56.7 kg (125 lb) 55.2 kg (121 lb 11.1 oz) 55.157 kg (121 lb 9.6 oz)    Exam:   General: No distress.   Cardiovascular: S 1, S 2 RRR  Respiratory: CTA  Abdomen: BS present, soft, NT  Musculoskeletal: No edema.   Data Reviewed: Basic Metabolic Panel:  Recent Labs Lab 01/03/13 0830 01/05/13 1155 01/05/13 1613 01/06/13 0511 01/08/13 0503  NA 138 136  --  137 137  K 3.5 4.2  --  3.4* 3.5  CL 99 104  --  107 104  CO2 25 23  --  21 23  GLUCOSE 81 85  --  77 81  BUN 14 13  --  13 13  CREATININE 1.46* 1.30  --  1.13 1.21  CALCIUM 9.8 8.9  --  8.4 8.6  MG  --   --  1.8  --   --    Liver Function Tests:  Recent  Labs Lab 01/05/13 1155  AST 25  ALT 7  ALKPHOS 66  BILITOT 0.3  PROT 7.2  ALBUMIN 3.2*    Recent Labs Lab 01/05/13 1155  LIPASE 35   No results found for this basename: AMMONIA,  in the last 168 hours CBC:  Recent Labs Lab 01/07/13 0505 01/07/13 1452 01/07/13 2050 01/08/13 0503 01/09/13 0510  WBC 4.3 4.4 4.6 3.5* 3.8*  HGB 8.8* 8.3* 8.5* 7.8* 8.9*  HCT 25.1* 23.6* 23.8* 21.7* 25.7*  MCV 81.0 81.1 81.2 81.0 82.6  PLT 55* 57* 71* 63* 71*   Cardiac Enzymes: No results found for this basename: CKTOTAL, CKMB, CKMBINDEX, TROPONINI,  in the last 168 hours BNP (last 3 results) No results found for this basename: PROBNP,  in the last 8760 hours CBG: No results found for this basename: GLUCAP,  in the last 168 hours  No results found for this or any previous visit (from the past 240 hour(s)).   Studies: No  results found.  Scheduled Meds: . atorvastatin  10 mg Oral q morning - 10a  . feeding supplement  237 mL Oral BID BM  . ferrous sulfate  325 mg Oral Q breakfast  . sodium chloride  1,000 mL Intravenous Once  . sodium chloride  3 mL Intravenous Q12H   Continuous Infusions:    Principal Problem:   Lower GI bleed Active Problems:   COPD (chronic obstructive pulmonary disease)   Iron deficiency anemia   Bradycardia, sinus   HTN (hypertension)   Hyperlipidemia   Colon polyps   Acute blood loss anemia   Protein-calorie malnutrition, severe    Time spent: 25 minutes.     Tangelia Sanson  [facility] Pager [phone]. If 7PM-7AM, please contact night-coverage at [email or web address], password [facility] 01/09/2013, 4:05 PM  LOS: 4 days

## 2013-01-09 NOTE — Progress Notes (Signed)
Feels well, tolerating solid diet. Bowel movement with minimal amounts of bright red blood noted by nurse this afternoon. Hemoglobin shows appropriate posttransfusion rise.  Impression: Doubt recurrent bleeding, but we will need to follow the patient in a while longer.  Plan. Recheck CBC in a.m. and monitor for further bleeding.  Florencia Reasons, M.D. [phone]

## 2013-01-09 NOTE — Progress Notes (Signed)
INITIAL NUTRITION ASSESSMENT  Pt meets criteria for severe MALNUTRITION in the context of chronic illness as evidenced by <75% estimated energy intake for months and pt with visible severe muscle wasting and subcutaneous fat loss in clavicles, upper arms, and thoracic/lumbar region.  DOCUMENTATION CODES Per approved criteria  -Severe malnutrition in the context of chronic illness -Underweight   INTERVENTION: - Ensure Complete BID - Encouraged pt to consume high protein foods with every meal to preserve lean body mass  - Will continue to monitor  NUTRITION DIAGNOSIS: Increased nutrient needs related to underweight as evidenced by BMI of 16.   Goal: Pt to consume >90% of meals/supplements  Monitor:  Weights, labs, intake  Reason for Assessment: Nutrition risk   78 y.o. male  Admitting Dx: Lower GI bleed  ASSESSMENT: Pt s/o colonoscopy 01/03/13, admitted with 2 day history of bloody bowel movements. Met with pt who reports eating 2 meals/day at home, not on any nutritional supplements. Pt reports since his abdominal aneurysm surgery in 2013 he has lost 29 pounds unintentionally and has not been able to gain it back. Pt feels like he has lost muscle mass. Pt ate 100% of lunch today.   Nutrition Focused Physical Exam:  Subcutaneous Fat:  Orbital Region: mild/moderate wasting Upper Arm Region: severe wasting Thoracic and Lumbar Region: severe wasting  Muscle:  Temple Region: mild/moderate wasting Clavicle Bone Region: severe wasting Clavicle and Acromion Bone Region: severe wasting Scapular Bone Region: NA Dorsal Hand: severe wasting Patellar Region: mild/moderate wasting Anterior Thigh Region: mild/moderate wasting Posterior Calf Region: mild/moderate wasting  Edema: None noted    Height: Ht Readings from Last 1 Encounters:  01/05/13 5\' 11"  (1.803 m)    Weight: Wt Readings from Last 1 Encounters:  01/09/13 121 lb 9.6 oz (55.157 kg)    Ideal Body Weight: 172  lb  % Ideal Body Weight: 70%  Wt Readings from Last 10 Encounters:  01/09/13 121 lb 9.6 oz (55.157 kg)  01/09/13 121 lb 9.6 oz (55.157 kg)  01/09/13 121 lb 9.6 oz (55.157 kg)  12/13/12 126 lb (57.153 kg)  11/02/12 128 lb (58.06 kg)  10/12/12 128 lb (58.06 kg)  10/10/12 128 lb (58.06 kg)  08/08/12 129 lb 9.6 oz (58.786 kg)  07/19/12 130 lb (58.968 kg)  07/19/12 130 lb (58.968 kg)     Usual Body Weight: 150 lb per pt (over 1 year ago)  % Usual Body Weight: 81%  BMI:  Body mass index is 16.97 kg/(m^2). Underweight  Estimated Nutritional Needs: Kcal: 1191-4782 Protein: 100-110g Fluid: 1.9-2.2L/day  Skin: Intact   Diet Order: Cardiac  EDUCATION NEEDS: -No education needs identified at this time   Intake/Output Summary (Last 24 hours) at 01/09/13 1450 Last data filed at 01/09/13 1237  Gross per 24 hour  Intake    550 ml  Output    200 ml  Net    350 ml    Last BM: 7/15  Labs:   Recent Labs Lab 01/05/13 1155 01/05/13 1613 01/06/13 0511 01/08/13 0503  NA 136  --  137 137  K 4.2  --  3.4* 3.5  CL 104  --  107 104  CO2 23  --  21 23  BUN 13  --  13 13  CREATININE 1.30  --  1.13 1.21  CALCIUM 8.9  --  8.4 8.6  MG  --  1.8  --   --   GLUCOSE 85  --  77 81    CBG (last  3)  No results found for this basename: GLUCAP,  in the last 72 hours  Scheduled Meds: . atorvastatin  10 mg Oral q morning - 10a  . ferrous sulfate  325 mg Oral Q breakfast  . sodium chloride  1,000 mL Intravenous Once  . sodium chloride  3 mL Intravenous Q12H    Continuous Infusions:   Past Medical History  Diagnosis Date  . Thrombocytopenia   . Hyperlipidemia 08/30/2011  . Liver damage 1960's    Several liver bx due to elevated liver functions  . Hypertension   . GERD (gastroesophageal reflux disease)   . Anemia   . Iron deficiency anemia 05/26/2011  . Cellulitis 2013; 10/11/2012    "LLE; RLE" (10/11/2012)  . AAA (abdominal aortic aneurysm)   . Abdominal aneurysm without  mention of rupture 04/23/2011  . Peripheral arterial disease 04/23/2011  . Pneumonia     "once a few years ago" (10/11/2012)  . History of blood transfusion 2013    "related to LLE hematoma" (10/11/2012)  . Syncope and collapse 10/11/2012  . Shortness of breath     none today (12/13/12)  . Hematoma 12-13-12    resolving bilaterally, right thigh remains"lump"  . Bowel trouble 12-13-12    "passing mucus only" seeing Dr. Carvel Getting today  . COPD (chronic obstructive pulmonary disease) 05/06/2011  . Stage III chronic kidney disease     /notes 10/11/2012    Past Surgical History  Procedure Laterality Date  . Abdominal aortic aneurysm repair  2013  . Pr vein bypass graft,aorto-fem-pop    . Esophagogastroduodenoscopy  06/10/2011    Procedure: ESOPHAGOGASTRODUODENOSCOPY (EGD);  Surgeon: Petra Kuba, MD;  [facility] ENDOSCOPY;  Service: Endoscopy;  Laterality: N/A;  . Colonoscopy with propofol  07/19/2012    Procedure: COLONOSCOPY WITH PROPOFOL;  Surgeon: Willis Modena, MD;  Location: [facility] ENDOSCOPY;  Service: Endoscopy;  Laterality: N/A;  polyp lifter needed  . Colonoscopy with propofol N/A 01/03/2013    Procedure: COLONOSCOPY WITH PROPOFOL;  Surgeon: Willis Modena, MD;  Location: [facility] ENDOSCOPY;  Service: Endoscopy;  Laterality: N/A;  . Flexible sigmoidoscopy N/A 01/07/2013    Procedure: FLEXIBLE SIGMOIDOSCOPY;  Surgeon: Barrie Folk, MD;  Location: [facility] ENDOSCOPY;  Service: Endoscopy;  Laterality: N/A;  . Colonoscopy N/A 01/08/2013    Procedure: COLONOSCOPY;  Surgeon: Florencia Reasons, MD;  Location: [facility] ENDOSCOPY;  Service: Endoscopy;  Laterality: N/A;     Levon Hedger MS, RD, LDN [phone] Pager [phone] After Hours Pager

## 2013-01-10 DIAGNOSIS — D696 Thrombocytopenia, unspecified: Secondary | ICD-10-CM

## 2013-01-10 LAB — CBC
MCV: 83.2 fL (ref 78.0–100.0)
Platelets: 79 10*3/uL — ABNORMAL LOW (ref 150–400)
RBC: 3.1 MIL/uL — ABNORMAL LOW (ref 4.22–5.81)
WBC: 4.6 10*3/uL (ref 4.0–10.5)

## 2013-01-10 MED ORDER — POLYETHYLENE GLYCOL 3350 17 G PO PACK
17.0000 g | PACK | Freq: Four times a day (QID) | ORAL | Status: AC
  Administered 2013-01-10 (×4): 17 g via ORAL
  Filled 2013-01-10 (×4): qty 1

## 2013-01-10 MED ORDER — MAGNESIUM CITRATE PO SOLN
1.0000 | Freq: Once | ORAL | Status: AC
  Administered 2013-01-10: 1 via ORAL

## 2013-01-10 NOTE — Progress Notes (Signed)
No BMs since yesterday evening. No evident bleeding. Hemoglobin stable this morning. Feels well. Eating okay.  However, rectal exam this morning shows pasty maroon blood on the examining finger.   It is not clear whether this reflects residual old blood or whether the patient is having slight ongoing bleeding. He quite evidently is not having active acute bleeding.  Recommendation:  1. I do not feel the patient should be discharged until we are clear that he has stopped bleeding. We need to confirm that he does not have ongoing oozing.  2. I will order laxatives to purge out his colon. If he clears the blood, he would be ready for discharge at any time. In my experience, once a post polypectomy bleed has stopped bleeding, it typically does not restart.  Florencia Reasons, M.D. [phone]

## 2013-01-10 NOTE — Progress Notes (Signed)
Gave magnesium citrate this morning. Pt has a BM and it was solid and bloody. It was dark red and had visible clots. Will continue to monitor.

## 2013-01-10 NOTE — Progress Notes (Signed)
[facility] PROGRESS NOTE  [name] ZOX:[id number] DOB: 08/16/1934 DOA: 01/05/2013 PCP: Oliver Barre, MD  Assessment/Plan:  1-Lower GI bleed: Post polypectomy.  Patient with recent colonoscopy with polypectomy. Likely secondary to recent colonoscopy with polypectomy in the setting of thrombocytopenia. Hemoglobin was at 11.7 2 days prior to admission.   Patient had Sigmoidoscopy on 7-13 which was not diagnostic.  Patient S/P  1 PRBC and 1 unit of platelet transfusion 7-14.  Patient had colonoscopy 7-14: Which show active oozing with recent significant bleeding from polypectomy site just above the cecum. Patient had  injection of epinephrine and application of a clip on site of prior polypectomy.  Diet advance 7-15.  Hb stable this am, however GI reported maroon stool on rectal exam. Plan is to give laxatives today to purge out his colon. Anticipate DC home in am as long as Hb remains stable.  2 Acute blood loss anemia/iron deficiency anemia  In setting GI bleed post polypectomy. Hb stable.   3 Thrombocytopenia  Patient does have a history of thrombocytopenia which is felt to be secondary to a platelet function defect and also does have a history of hemolytic anemia. Platelet count today 79.  S/p transfusion of one unit to help with hemostasis.   4 Sinus bradycardia  Patient does have a history of sinus bradycardia. Heart rate increases with movement. Patient currently asymptomatic.  5 hypertension  Hold Norvasc in setting of bleeding.  Well controleed  6 prophylaxis  SCDs for DVT prophylaxis. No anticoagulation in setting of GI bleed.   7-Severe Malnutrition: Nutrition consulted.    Code Status: Full Code.  Family Communication: Care discussed with patient.  Disposition Plan: Home in 1 or 2 days.    Consultants:  GI, Dr. Matthias Hughs   Procedures:  none  Antibiotics:  None  HPI/Subjective: Feels great! Anxious to go home.  Objective: Filed Vitals:   01/09/13  1341 01/09/13 2129 01/10/13 0451 01/10/13 0500  BP: 102/61 132/66 118/72   Pulse: 65 63 66   Temp: 98.6 F (37 C) 98.4 F (36.9 C) 98.2 F (36.8 C)   TempSrc: Oral Oral Oral   Resp: 18 18 18    Height:      Weight:    55.8 kg (123 lb 0.3 oz)  SpO2: 98% 98% 96%     Intake/Output Summary (Last 24 hours) at 01/10/13 1016 Last data filed at 01/10/13 0700  Gross per 24 hour  Intake    440 ml  Output      0 ml  Net    440 ml   Filed Weights   01/08/13 0645 01/09/13 0513 01/10/13 0500  Weight: 55.2 kg (121 lb 11.1 oz) 55.157 kg (121 lb 9.6 oz) 55.8 kg (123 lb 0.3 oz)    Exam:   General: No distress.   Cardiovascular: S 1, S 2 RRR  Respiratory: CTA  Abdomen: BS present, soft, NT  Musculoskeletal: No edema.   Data Reviewed: Basic Metabolic Panel:  Recent Labs Lab 01/05/13 1155 01/05/13 1613 01/06/13 0511 01/08/13 0503  NA 136  --  137 137  K 4.2  --  3.4* 3.5  CL 104  --  107 104  CO2 23  --  21 23  GLUCOSE 85  --  77 81  BUN 13  --  13 13  CREATININE 1.30  --  1.13 1.21  CALCIUM 8.9  --  8.4 8.6  MG  --  1.8  --   --    Liver  Function Tests:  Recent Labs Lab 01/05/13 1155  AST 25  ALT 7  ALKPHOS 66  BILITOT 0.3  PROT 7.2  ALBUMIN 3.2*    Recent Labs Lab 01/05/13 1155  LIPASE 35   No results found for this basename: AMMONIA,  in the last 168 hours CBC:  Recent Labs Lab 01/07/13 1452 01/07/13 2050 01/08/13 0503 01/09/13 0510 01/10/13 0515  WBC 4.4 4.6 3.5* 3.8* 4.6  HGB 8.3* 8.5* 7.8* 8.9* 9.0*  HCT 23.6* 23.8* 21.7* 25.7* 25.8*  MCV 81.1 81.2 81.0 82.6 83.2  PLT 57* 71* 63* 71* 79*   Cardiac Enzymes: No results found for this basename: CKTOTAL, CKMB, CKMBINDEX, TROPONINI,  in the last 168 hours BNP (last 3 results) No results found for this basename: PROBNP,  in the last 8760 hours CBG: No results found for this basename: GLUCAP,  in the last 168 hours  No results found for this or any previous visit (from the past 240 hour(s)).    Studies: No results found.  Scheduled Meds: . atorvastatin  10 mg Oral q morning - 10a  . feeding supplement  237 mL Oral BID BM  . ferrous sulfate  325 mg Oral Q breakfast  . polyethylene glycol  17 g Oral QID  . sodium chloride  1,000 mL Intravenous Once  . sodium chloride  3 mL Intravenous Q12H   Continuous Infusions:    Principal Problem:   Lower GI bleed Active Problems:   COPD (chronic obstructive pulmonary disease)   Iron deficiency anemia   Bradycardia, sinus   HTN (hypertension)   Hyperlipidemia   Colon polyps   Acute blood loss anemia   Protein-calorie malnutrition, severe    Time spent: 35 minutes.     [facility]  [facility] Pager [phone]. If 7PM-7AM, please contact night-coverage at [email or web address], password [facility] 01/10/2013, 10:16 AM  LOS: 5 days

## 2013-01-11 LAB — CBC
HCT: 26.9 % — ABNORMAL LOW (ref 39.0–52.0)
Hemoglobin: 9.3 g/dL — ABNORMAL LOW (ref 13.0–17.0)
RBC: 3.21 MIL/uL — ABNORMAL LOW (ref 4.22–5.81)
WBC: 4.7 10*3/uL (ref 4.0–10.5)

## 2013-01-11 NOTE — Progress Notes (Signed)
Pt had a BM at shift change. BM was dark brown. Toilet paper was brown. Will continue to monitor.

## 2013-01-11 NOTE — Discharge Summary (Signed)
Physician Discharge Summary  [name] ZHY:[id number] DOB: [birth year] DOA: 01/05/2013  PCP: Oliver Barre, MD  Admit date: 01/05/2013 Discharge date: 01/11/2013  Time spent: Greater than 30 minutes  Recommendations for Outpatient Follow-up:  -Has follow up scheduled with GI, Dr. Dulce Sellar on 7/21. CBC should be drawn to follow his Hb.   Discharge Diagnoses:  Principal Problem:   Lower GI bleed Active Problems:   COPD (chronic obstructive pulmonary disease)   Iron deficiency anemia   Bradycardia, sinus   HTN (hypertension)   Hyperlipidemia   Colon polyps   Acute blood loss anemia   Protein-calorie malnutrition, severe   Thrombocytopenia, unspecified   Discharge Condition: Stable and improved.  Filed Weights   01/09/13 0513 01/10/13 0500 01/11/13 0519  Weight: 55.157 kg (121 lb 9.6 oz) 55.8 kg (123 lb 0.3 oz) 55.89 kg (123 lb 3.4 oz)    History of present illness:  Patient is a 78 y.o. male with history of thrombocytopenia, hyperlipidemia, hypertension, AAA status post repair, peripheral arterial disease, thrombocytopenia, COPD with ongoing tobacco abuse who had a recent colonoscopy with polypectomy done on Wednesday, 01/03/2013 2 days prior to admission.  Patient was presented to the EEG with a 2 day history of bloody bowel movements. Patient stated that one day prior to admission post colonoscopy he had 3 bloody bowel movements. On the day of admission patient had one bloody bowel movement at home and one in the emergency room. Patient subsequently presented to the ED. Patient denies any fevers, no chills, no chest pain, no shortness of breath, no abdominal pain, no dizziness, no syncope, no diaphoresis, no diarrhea, no constipation, no weakness. Patient does endorse a chronic cough. No other associated symptoms. Patient was in the emergency room had one bloody bowel movement. Hemoglobin checked in the ED was 10.1 from 11.7 prior to colonoscopy. We were asked to admit him for further  evaluation and management.   Hospital Course:   1-Lower GI bleed: Post polypectomy.  Patient with recent colonoscopy with polypectomy. Likely secondary to recent colonoscopy with polypectomy in the setting of thrombocytopenia. Hemoglobin was at 11.7 2 days prior to admission.  Patient had Sigmoidoscopy on 7-13 which was not diagnostic.  Patient S/P 1 PRBC and 1 unit of platelet transfusion 7-14.  Patient had colonoscopy 7-14: Which show active oozing with recent significant bleeding from polypectomy site just above the cecum. Patient had injection of epinephrine and application of a clip on site of prior polypectomy. Marland Kitchen  Hb is stable to slightly improved.   2 Acute blood loss anemia/iron deficiency anemia  In setting GI bleed post polypectomy. Hb stable.   3 Thrombocytopenia  Patient does have a history of thrombocytopenia which is felt to be secondary to a platelet function defect and also does have a history of hemolytic anemia. Platelet count today 79. S/p transfusion of one unit to help with hemostasis.   4 Sinus bradycardia  Patient does have a history of sinus bradycardia. Heart rate increases with movement. Patient currently asymptomatic.   5 hypertension  Well controleed   6 prophylaxis  SCDs for DVT prophylaxis. No anticoagulation in setting of GI bleed.   7-Severe Malnutrition: Nutrition consulted.     Consultations:  GI, Dr. Matthias Hughs  Discharge Instructions  Discharge Orders   Future Appointments Provider Department Dept Phone   03/06/2013 1:00 PM Delcie Roch [facility] MEDICAL ONCOLOGY [phone]   03/06/2013 1:30 PM [facility] MEDICAL ONCOLOGY [phone]  05/07/2013 2:15 PM Corwin Levins, MD [facility] [phone]   Future Orders Complete By Expires     Diet - low sodium heart healthy  As directed     Discontinue IV  As directed     Increase activity slowly  As  directed         Medication List         amLODipine 5 MG tablet  Commonly known as:  NORVASC  Take 5 mg by mouth daily before breakfast.     atorvastatin 10 MG tablet  Commonly known as:  LIPITOR  Take 10 mg by mouth every morning.     ferrous sulfate 325 (65 FE) MG tablet  Take 325 mg by mouth daily with breakfast.     HYDROcodone-acetaminophen 5-325 MG per tablet  Commonly known as:  NORCO/VICODIN  Take 1-2 tablets by mouth every 6 (six) hours as needed for pain.       Allergies  Allergen Reactions  . Levofloxacin Other (See Comments)    HEMOLYTIC ANEMIA       Follow-up Information   Follow up with Freddy Jaksch, MD On 01/15/2013. (2:45 pm)    Contact information:   [address] [place] [address] [phone]        The results of significant diagnostics from this hospitalization (including imaging, microbiology, ancillary and laboratory) are listed below for reference.    Significant Diagnostic Studies: No results found.  Microbiology: No results found for this or any previous visit (from the past 240 hour(s)).   Labs: Basic Metabolic Panel:  Recent Labs Lab 01/05/13 1155 01/05/13 1613 01/06/13 0511 01/08/13 0503  NA 136  --  137 137  K 4.2  --  3.4* 3.5  CL 104  --  107 104  CO2 23  --  21 23  GLUCOSE 85  --  77 81  BUN 13  --  13 13  CREATININE 1.30  --  1.13 1.21  CALCIUM 8.9  --  8.4 8.6  MG  --  1.8  --   --    Liver Function Tests:  Recent Labs Lab 01/05/13 1155  AST 25  ALT 7  ALKPHOS 66  BILITOT 0.3  PROT 7.2  ALBUMIN 3.2*    Recent Labs Lab 01/05/13 1155  LIPASE 35   No results found for this basename: AMMONIA,  in the last 168 hours CBC:  Recent Labs Lab 01/07/13 2050 01/08/13 0503 01/09/13 0510 01/10/13 0515 01/11/13 0505  WBC 4.6 3.5* 3.8* 4.6 4.7  HGB 8.5* 7.8* 8.9* 9.0* 9.3*  HCT 23.8* 21.7* 25.7* 25.8* 26.9*  MCV 81.2 81.0 82.6 83.2 83.8  PLT 71* 63* 71* 79* 75*   Cardiac  Enzymes: No results found for this basename: CKTOTAL, CKMB, CKMBINDEX, TROPONINI,  in the last 168 hours BNP: BNP (last 3 results) No results found for this basename: PROBNP,  in the last 8760 hours CBG: No results found for this basename: GLUCAP,  in the last 168 hours     Signed:  Chaya Jan  [facility] Pager: [phone] 01/11/2013, 12:20 PM

## 2013-01-11 NOTE — Progress Notes (Signed)
Feels well.  Non-bloody BM this a.m.  Hgb rising gradually.  No orthostatic symtoms while up and around.  IMPR:  Quiescent post-polypectomy bleed.  PLAN:  OK for dischg from my standpoint; have advised pt to return to ER in the (unlikely) event of recurrent bleeding, also ASA/NSAID avoidance (acetaminophen ok) for next 2 weeks.  Have made appt w/ pt to see Dr. Dulce Sellar on Monday, July 21 at 2:45pm.  Florencia Reasons, M.D. [phone]

## 2013-01-14 LAB — URINE CULTURE

## 2013-03-01 ENCOUNTER — Telehealth: Payer: Self-pay | Admitting: Internal Medicine

## 2013-03-01 NOTE — Telephone Encounter (Signed)
Moved 9/8 appt to 10/8 @ 1pm lb/CP1 due to schedule change. lmonvm for pt and mailed schedule.

## 2013-03-05 ENCOUNTER — Telehealth: Payer: Self-pay | Admitting: Internal Medicine

## 2013-03-05 NOTE — Telephone Encounter (Signed)
Pt called to confirm appt for 10/8.

## 2013-03-06 ENCOUNTER — Ambulatory Visit: Payer: Medicare Other

## 2013-03-06 ENCOUNTER — Other Ambulatory Visit: Payer: Medicare Other | Admitting: Lab

## 2013-03-14 NOTE — Progress Notes (Signed)
This encounter was created in error - please disregard.

## 2013-03-18 ENCOUNTER — Emergency Department (HOSPITAL_COMMUNITY)
Admission: EM | Admit: 2013-03-18 | Discharge: 2013-03-18 | Disposition: A | Discharge status: Home / Self Care | Payer: Medicare Other | Attending: Emergency Medicine | Admitting: Emergency Medicine

## 2013-03-18 ENCOUNTER — Encounter (HOSPITAL_COMMUNITY): Payer: Self-pay | Admitting: *Deleted

## 2013-03-18 ENCOUNTER — Emergency Department (HOSPITAL_COMMUNITY): Payer: Medicare Other

## 2013-03-18 DIAGNOSIS — Z8719 Personal history of other diseases of the digestive system: Secondary | ICD-10-CM | POA: Insufficient documentation

## 2013-03-18 DIAGNOSIS — M7989 Other specified soft tissue disorders: Secondary | ICD-10-CM

## 2013-03-18 DIAGNOSIS — I809 Phlebitis and thrombophlebitis of unspecified site: Secondary | ICD-10-CM

## 2013-03-18 DIAGNOSIS — F17200 Nicotine dependence, unspecified, uncomplicated: Secondary | ICD-10-CM | POA: Insufficient documentation

## 2013-03-18 DIAGNOSIS — N183 Chronic kidney disease, stage 3 unspecified: Secondary | ICD-10-CM | POA: Insufficient documentation

## 2013-03-18 DIAGNOSIS — E785 Hyperlipidemia, unspecified: Secondary | ICD-10-CM | POA: Insufficient documentation

## 2013-03-18 DIAGNOSIS — Z9189 Other specified personal risk factors, not elsewhere classified: Secondary | ICD-10-CM | POA: Insufficient documentation

## 2013-03-18 DIAGNOSIS — I8000 Phlebitis and thrombophlebitis of superficial vessels of unspecified lower extremity: Secondary | ICD-10-CM | POA: Insufficient documentation

## 2013-03-18 DIAGNOSIS — Z8701 Personal history of pneumonia (recurrent): Secondary | ICD-10-CM | POA: Insufficient documentation

## 2013-03-18 DIAGNOSIS — J4489 Other specified chronic obstructive pulmonary disease: Secondary | ICD-10-CM | POA: Insufficient documentation

## 2013-03-18 DIAGNOSIS — I129 Hypertensive chronic kidney disease with stage 1 through stage 4 chronic kidney disease, or unspecified chronic kidney disease: Secondary | ICD-10-CM | POA: Insufficient documentation

## 2013-03-18 DIAGNOSIS — J449 Chronic obstructive pulmonary disease, unspecified: Secondary | ICD-10-CM | POA: Insufficient documentation

## 2013-03-18 DIAGNOSIS — Z872 Personal history of diseases of the skin and subcutaneous tissue: Secondary | ICD-10-CM | POA: Insufficient documentation

## 2013-03-18 DIAGNOSIS — D649 Anemia, unspecified: Secondary | ICD-10-CM | POA: Insufficient documentation

## 2013-03-18 DIAGNOSIS — M79609 Pain in unspecified limb: Secondary | ICD-10-CM

## 2013-03-18 DIAGNOSIS — Z79899 Other long term (current) drug therapy: Secondary | ICD-10-CM | POA: Insufficient documentation

## 2013-03-18 LAB — CBC WITH DIFFERENTIAL/PLATELET
Eosinophils Absolute: 0.1 10*3/uL (ref 0.0–0.7)
Eosinophils Relative: 1 % (ref 0–5)
Hemoglobin: 10.7 g/dL — ABNORMAL LOW (ref 13.0–17.0)
Lymphs Abs: 1.5 10*3/uL (ref 0.7–4.0)
MCH: 29.2 pg (ref 26.0–34.0)
MCV: 82.6 fL (ref 78.0–100.0)
Monocytes Relative: 11 % (ref 3–12)
RBC: 3.67 MIL/uL — ABNORMAL LOW (ref 4.22–5.81)

## 2013-03-18 LAB — COMPREHENSIVE METABOLIC PANEL
Alkaline Phosphatase: 81 U/L (ref 39–117)
BUN: 14 mg/dL (ref 6–23)
CO2: 23 mEq/L (ref 19–32)
Calcium: 9.1 mg/dL (ref 8.4–10.5)
GFR calc Af Amer: 57 mL/min — ABNORMAL LOW (ref >90)
GFR calc non Af Amer: 49 mL/min — ABNORMAL LOW (ref >90)
Glucose, Bld: 105 mg/dL — ABNORMAL HIGH (ref 70–99)
Potassium: 3.4 mEq/L — ABNORMAL LOW (ref 3.5–5.1)
Total Protein: 7.3 g/dL (ref 6.0–8.3)

## 2013-03-18 LAB — D-DIMER, QUANTITATIVE: D-Dimer, Quant: >20 ug/mL-FEU — ABNORMAL HIGH (ref 0.00–0.48)

## 2013-03-18 MED ORDER — IBUPROFEN 400 MG PO TABS: 400.0000 mg | ORAL_TABLET | Freq: Three times a day (TID) | ORAL | Status: DC | PRN

## 2013-03-18 NOTE — Progress Notes (Signed)
VASCULAR LAB PRELIMINARY  PRELIMINARY  PRELIMINARY  PRELIMINARY  Left upper extremity venous Doppler completed.    Preliminary report:  There is no DVT noted in the left upper extremity.  There is superficial thrombosis noted in the basilic vein in the left forearm.  [name], RVT 03/18/2013, 3:56 PM

## 2013-03-18 NOTE — ED Notes (Signed)
Pt reports to ed with c/o swollen, painful left arm.

## 2013-03-18 NOTE — ED Provider Notes (Signed)
CSN: [id number]     Arrival date & time 03/18/13  1311 History   First MD Initiated Contact with Patient 03/18/13 1347     No chief complaint on file.  (Consider location/radiation/quality/duration/timing/severity/associated sxs/prior Treatment) HPI Comments: Pt comes in with cc of LUE swelling x 2 days. There is no pain, itching. No hx of DVT , PE, no hx of cancers. Pt has no pain, no redness to the arm. NO trauma.  The history is provided by the patient.    Past Medical History  Diagnosis Date  . Thrombocytopenia   . Hyperlipidemia 08/30/2011  . Liver damage 1960's    Several liver bx due to elevated liver functions  . Hypertension   . GERD (gastroesophageal reflux disease)   . Anemia   . Iron deficiency anemia 05/26/2011  . Cellulitis 2013; 10/11/2012    "LLE; RLE" (10/11/2012)  . AAA (abdominal aortic aneurysm)   . Abdominal aneurysm without mention of rupture 04/23/2011  . Peripheral arterial disease 04/23/2011  . Pneumonia     "once a few years ago" (10/11/2012)  . History of blood transfusion 2013    "related to LLE hematoma" (10/11/2012)  . Syncope and collapse 10/11/2012  . Shortness of breath     none today (12/13/12)  . Hematoma 12-13-12    resolving bilaterally, right thigh remains"lump"  . Bowel trouble 12-13-12    "passing mucus only" seeing Dr. Carvel Getting today  . COPD (chronic obstructive pulmonary disease) 05/06/2011  . Stage III chronic kidney disease     /notes 10/11/2012   Past Surgical History  Procedure Laterality Date  . Abdominal aortic aneurysm repair  2013  . Pr vein bypass graft,aorto-fem-pop    . Esophagogastroduodenoscopy  06/10/2011    Procedure: ESOPHAGOGASTRODUODENOSCOPY (EGD);  Surgeon: Petra Kuba, MD;  Location: [facility] END[facility]COPY;  Service: Endoscopy;  Laterality: N/A;  . Colonoscopy with propofol  07/19/2012    Procedure: COLONOSCOPY WITH PROPOFOL;  Surgeon: Willis Modena, MD;  Location: [facility] ENDOSCOPY;  Service: Endoscopy;  Laterality: N/A;  polyp  lifter needed  . Colonoscopy with propofol N/A 01/03/2013    Procedure: COLONOSCOPY WITH PROPOFOL;  Surgeon: Willis Modena, MD;  Location: [facility] ENDOSCOPY;  Service: Endoscopy;  Laterality: N/A;  . Flexible sigmoidoscopy N/A 01/07/2013    Procedure: FLEXIBLE SIGMOIDOSCOPY;  Surgeon: Barrie Folk, MD;  Location: [facility] ENDOSCOPY;  Service: Endoscopy;  Laterality: N/A;  . Colonoscopy N/A 01/08/2013    Procedure: COLONOSCOPY;  Surgeon: Florencia Reasons, MD;  Location: [facility] ENDOSCOPY;  Service: Endoscopy;  Laterality: N/A;   Family History  Problem Relation Age of Onset  . Cancer Mother     breast  . Hypertension Father   . Diabetes Sister   . COPD Brother    History  Substance Use Topics  . Smoking status: Current Every Day Smoker -- 0.50 packs/day for 60 years    Types: Cigarettes  . Smokeless tobacco: Never Used  . Alcohol Use: No     Comment: 10/11/2012 "quit drinking > 10 yr ago"; Was a heavy drinker in his 20s-30s    Review of Systems  Constitutional: Negative for activity change.  HENT: Negative for facial swelling, neck pain and neck stiffness.   Musculoskeletal: Negative for myalgias.  Skin: Negative for rash.  Hematological: Does not bruise/bleed easily.    Allergies  Levofloxacin  Home Medications   Current Outpatient Rx  Name  Route  Sig  Dispense  Refill  . amLODipine (NORVASC) 5 MG tablet   Oral  Take 5 mg by mouth daily before breakfast.         . atorvastatin (LIPITOR) 10 MG tablet   Oral   Take 10 mg by mouth every morning.         . ferrous sulfate 325 (65 FE) MG tablet   Oral   Take 325 mg by mouth daily with breakfast.         . HYDROcodone-acetaminophen (NORCO/VICODIN) 5-325 MG per tablet   Oral   Take 1-2 tablets by mouth every 6 (six) hours as needed for pain.         Marland Kitchen ibuprofen (ADVIL,MOTRIN) 200 MG tablet   Oral   Take 200 mg by mouth every 6 (six) hours as needed for pain.         . Menthol-Camphor (SOLTICE QUICK-RUB) 5.1-5.1 % OINT    Apply externally   Apply 1 application topically as needed (for pain).         Marland Kitchen ibuprofen (ADVIL,MOTRIN) 400 MG tablet   Oral   Take 1 tablet (400 mg total) by mouth every 8 (eight) hours as needed for pain.   20 tablet   0    BP 172/94  Pulse 61  Temp(Src) 97.9 F (36.6 C) (Oral)  Resp 20  SpO2 100% Physical Exam  Nursing note and vitals reviewed. Constitutional: He is oriented to person, place, and time. He appears well-developed.  HENT:  Head: Normocephalic and atraumatic.  Eyes: Conjunctivae and EOM are normal. Pupils are equal, round, and reactive to light.  Neck: Normal range of motion. Neck supple.  Cardiovascular: Normal rate and regular rhythm.   Pulmonary/Chest: Effort normal and breath sounds normal.  Abdominal: Soft. Bowel sounds are normal. He exhibits no distension. There is no tenderness. There is no rebound and no guarding.  Musculoskeletal:  LUE - unilateral swelling no tenderness, no pitting, no erythema or callor.  Neurological: He is alert and oriented to person, place, and time.  Skin: Skin is warm.    ED Course  Procedures (including critical care time) Labs Review Labs Reviewed  CBC WITH DIFFERENTIAL - Abnormal; Notable for the following:    RBC 3.67 (*)    Hemoglobin 10.7 (*)    HCT 30.3 (*)    Platelets 89 (*)    All other components within normal limits  COMPREHENSIVE METABOLIC PANEL - Abnormal; Notable for the following:    Potassium 3.4 (*)    Glucose, Bld 105 (*)    Creatinine, Ser 1.37 (*)    Albumin 3.4 (*)    GFR calc non Af Amer 49 (*)    GFR calc Af Amer 57 (*)    All other components within normal limits  D-DIMER, QUANTITATIVE - Abnormal; Notable for the following:    D-Dimer, Quant >20.00 (*)    All other components within normal limits   Imaging Review Dg Chest 2 View  03/18/2013   CLINICAL DATA:  COPD with leg swelling  EXAM: CHEST  2 VIEW  COMPARISON:  April 29, 2012  FINDINGS: There is a probable nipple shadow on the  right. There is no edema or consolidation. There is underlying emphysema.  Heart size is normal. Pulmonary vascularity reflects underlying emphysema. No adenopathy. There is atherosclerotic change in the aorta. Aorta is tortuous. There is partial visualization of an inferior vena cava filter with the apex directed superiorly.  IMPRESSION: Suspect nipple shadow on right. Advise repeat study with nipple markers to confirm.  Underlying emphysema. No edema or consolidation.  Electronically Signed   By: Bretta Bang   On: 03/18/2013 15:21    MDM   1. Superficial thrombophlebitis    Pt comes in with cc of unilateral upper extremity swelling x 2 days. No cancer hx, no DVT, PE hx, [place] upper extremity reveals superficial thrombophlebitis. Pt started on motrin. Informed not to take more than 400 mg bid x 5 days, and having PCP f/u this week - given there is CKD. Pt also advised to ice and heat the extremity. No cellulitis. CXR shows no masses.    Derwood Kaplan, MD 03/18/13 1657

## 2013-03-21 ENCOUNTER — Ambulatory Visit (HOSPITAL_COMMUNITY)
Admission: RE | Admit: 2013-03-21 | Discharge: 2013-03-21 | Disposition: A | Discharge status: Home / Self Care | Payer: Medicare Other | Source: Ambulatory Visit | Attending: Internal Medicine | Admitting: Internal Medicine

## 2013-03-21 ENCOUNTER — Other Ambulatory Visit (HOSPITAL_COMMUNITY): Payer: Self-pay | Admitting: Internal Medicine

## 2013-03-21 ENCOUNTER — Ambulatory Visit (INDEPENDENT_AMBULATORY_CARE_PROVIDER_SITE_OTHER): Payer: Medicare Other | Admitting: Internal Medicine

## 2013-03-21 VITALS — BP 120/70 | HR 97 | Temp 99.4°F | Wt 128.5 lb

## 2013-03-21 DIAGNOSIS — I10 Essential (primary) hypertension: Secondary | ICD-10-CM

## 2013-03-21 DIAGNOSIS — M7989 Other specified soft tissue disorders: Secondary | ICD-10-CM | POA: Insufficient documentation

## 2013-03-21 DIAGNOSIS — R600 Localized edema: Secondary | ICD-10-CM

## 2013-03-21 DIAGNOSIS — R609 Edema, unspecified: Secondary | ICD-10-CM | POA: Insufficient documentation

## 2013-03-21 NOTE — Patient Instructions (Addendum)
Please continue all other medications as before, and refills have been done if requested., to start aspirin 81 mg per day Please have the pharmacy call with any other refills you may need.  You will be contacted regarding the referral for: Left arm venous doppler ultrasound - r/o DVT  (to see [facility] now)  Please remember to sign up for My Chart if you have not done so, as this will be important to you in the future with finding out test results, communicating by private email, and scheduling acute appointments online when needed.

## 2013-03-21 NOTE — Progress Notes (Signed)
*  Preliminary Results* Left upper extremity venous duplex completed. Left upper extremity is negative for deep and superficial vein thrombosis.  Preliminary results discussed with Dr.John.  03/21/2013 5:34 PM  Gertie Fey, RVT, RDCS, RDMS

## 2013-03-25 ENCOUNTER — Encounter: Payer: Self-pay | Admitting: Internal Medicine

## 2013-03-25 MED ORDER — ASPIRIN EC 81 MG PO TBEC: 81.0000 mg | DELAYED_RELEASE_TABLET | Freq: Every day | ORAL | Status: DC

## 2013-03-25 NOTE — Progress Notes (Signed)
Subjective:    Patient ID: [name], male    DOB: 01-21-1935, 78 y.o.   MRN: [id number]  HPI  Here to f/u, was seen in ER with new onset LUE pain/swelling with u/s c/w superfic phlebitis, pt today with wife stating pain/red/swelling has seemed to progress above the left elbow now to mid arm;  Has hx of PE, Pt denies chest pain, increased sob or doe, wheezing, orthopnea, PND, increased LE swelling, palpitations, dizziness or syncope.   Pt denies fever, wt loss, night sweats, loss of appetite, or other constitutional symptoms  Past Medical History  Diagnosis Date  . Thrombocytopenia   . Hyperlipidemia 08/30/2011  . Liver damage 1960's    Several liver bx due to elevated liver functions  . Hypertension   . GERD (gastroesophageal reflux disease)   . Anemia   . Iron deficiency anemia 05/26/2011  . Cellulitis 2013; 10/11/2012    "LLE; RLE" (10/11/2012)  . AAA (abdominal aortic aneurysm)   . Abdominal aneurysm without mention of rupture 04/23/2011  . Peripheral arterial disease 04/23/2011  . Pneumonia     "once a few years ago" (10/11/2012)  . History of blood transfusion 2013    "related to LLE hematoma" (10/11/2012)  . Syncope and collapse 10/11/2012  . Shortness of breath     none today (12/13/12)  . Hematoma 12-13-12    resolving bilaterally, right thigh remains"lump"  . Bowel trouble 12-13-12    "passing mucus only" seeing Dr. Carvel Getting today  . COPD (chronic obstructive pulmonary disease) 05/06/2011  . Stage III chronic kidney disease     /notes 10/11/2012   Past Surgical History  Procedure Laterality Date  . Abdominal aortic aneurysm repair  2013  . Pr vein bypass graft,aorto-fem-pop    . Esophagogastroduodenoscopy  06/10/2011    Procedure: ESOPHAGOGASTRODUODENOSCOPY (EGD);  Surgeon: Petra Kuba, MD;  Location: [facility] ENDOSCOPY;  Service: Endoscopy;  Laterality: N/A;  . Colonoscopy with propofol  07/19/2012    Procedure: COLONOSCOPY WITH PROPOFOL;  Surgeon: Willis Modena, MD;  Location:  [facility] ENDOSCOPY;  Service: Endoscopy;  Laterality: N/A;  polyp lifter needed  . Colonoscopy with propofol N/A 01/03/2013    Procedure: COLONOSCOPY WITH PROPOFOL;  Surgeon: Willis Modena, MD;  Location: [facility] ENDOSCOPY;  Service: Endoscopy;  Laterality: N/A;  . Flexible sigmoidoscopy N/A 01/07/2013    Procedure: FLEXIBLE SIGMOIDOSCOPY;  Surgeon: Barrie Folk, MD;  Location: [facility] ENDOSCOPY;  Service: Endoscopy;  Laterality: N/A;  . Colonoscopy N/A 01/08/2013    Procedure: COLONOSCOPY;  Surgeon: Florencia Reasons, MD;  Location: [facility] ENDOSCOPY;  Service: Endoscopy;  Laterality: N/A;    reports that he has been smoking Cigarettes.  He has a 30 pack-year smoking history. He has never used smokeless tobacco. He reports that he does not drink alcohol or use illicit drugs. family history includes COPD in his brother; Cancer in his mother; Diabetes in his sister; Hypertension in his father. Allergies  Allergen Reactions  . Levofloxacin Other (See Comments)    HEMOLYTIC ANEMIA   Current Outpatient Prescriptions on File Prior to Visit  Medication Sig Dispense Refill  . amLODipine (NORVASC) 5 MG tablet Take 5 mg by mouth daily before breakfast.      . atorvastatin (LIPITOR) 10 MG tablet Take 10 mg by mouth every morning.      . ferrous sulfate 325 (65 FE) MG tablet Take 325 mg by mouth daily with breakfast.      . HYDROcodone-acetaminophen (NORCO/VICODIN) 5-325 MG per tablet Take 1-2 tablets by  mouth every 6 (six) hours as needed for pain.      Marland Kitchen ibuprofen (ADVIL,MOTRIN) 200 MG tablet Take 200 mg by mouth every 6 (six) hours as needed for pain.      Marland Kitchen ibuprofen (ADVIL,MOTRIN) 400 MG tablet Take 1 tablet (400 mg total) by mouth every 8 (eight) hours as needed for pain.  20 tablet  0  . Menthol-Camphor (SOLTICE QUICK-RUB) 5.1-5.1 % OINT Apply 1 application topically as needed (for pain).       No current facility-administered medications on file prior to visit.   Review of Systems  Constitutional: Negative for  unexpected weight change, or unusual diaphoresis  HENT: Negative for tinnitus.   Eyes: Negative for photophobia and visual disturbance.  Respiratory: Negative for choking and stridor.   Gastrointestinal: Negative for vomiting and blood in stool.  Genitourinary: Negative for hematuria and decreased urine volume.  Musculoskeletal: Negative for acute joint swelling Skin: Negative for color change and wound.  Neurological: Negative for tremors and numbness other than noted  Psychiatric/Behavioral: Negative for decreased concentration or  hyperactivity.       Objective:   Physical Exam BP 120/70  Pulse 97  Temp(Src) 99.4 F (37.4 C) (Oral)  Wt 128 lb 8 oz (58.287 kg)  BMI 17.93 kg/m2  SpO2 98% VS noted, fatigued, uncomfortabel, nontoxic Constitutional: Pt appears well-developed and well-nourished.  HENT: Head: NCAT.  Right Ear: External ear normal.  Left Ear: External ear normal.  Eyes: Conjunctivae and EOM are normal. Pupils are equal, round, and reactive to light.  Neck: Normal range of motion. Neck supple.  Cardiovascular: Normal rate and regular rhythm.   Pulmonary/Chest: Effort normal and breath sounds decreased bilat  Abd:  Soft, NT, non-distended, + BS Neurological: Pt is alert. Not confused  Skin: LUE with diffuse tender swelling, mild erythema to mid right arm Psychiatric: Pt behavior is normal. Thought content normal.     Assessment & Plan:

## 2013-03-25 NOTE — Assessment & Plan Note (Signed)
Cant r/o DVT with worsening exam, for f/u LUE venous doppler

## 2013-03-25 NOTE — Assessment & Plan Note (Signed)
stable overall by history and exam, recent data reviewed with pt, and pt to continue medical treatment as before,  to f/u any worsening symptoms or concerns BP Readings from Last 3 Encounters:  03/21/13 120/70  03/18/13 172/94  01/11/13 120/75

## 2013-04-03 ENCOUNTER — Other Ambulatory Visit: Payer: Self-pay | Admitting: Internal Medicine

## 2013-04-03 DIAGNOSIS — D696 Thrombocytopenia, unspecified: Secondary | ICD-10-CM

## 2013-04-03 DIAGNOSIS — D509 Iron deficiency anemia, unspecified: Secondary | ICD-10-CM

## 2013-04-03 DIAGNOSIS — N179 Acute kidney failure, unspecified: Secondary | ICD-10-CM

## 2013-04-04 ENCOUNTER — Ambulatory Visit (HOSPITAL_BASED_OUTPATIENT_CLINIC_OR_DEPARTMENT_OTHER): Payer: Medicare Other | Admitting: Internal Medicine

## 2013-04-04 ENCOUNTER — Telehealth: Payer: Self-pay | Admitting: Internal Medicine

## 2013-04-04 ENCOUNTER — Other Ambulatory Visit (HOSPITAL_BASED_OUTPATIENT_CLINIC_OR_DEPARTMENT_OTHER): Payer: Medicare Other | Admitting: Lab

## 2013-04-04 VITALS — BP 103/66 | HR 105 | Temp 97.6°F | Resp 18 | Ht 72.0 in | Wt 128.4 lb

## 2013-04-04 DIAGNOSIS — D509 Iron deficiency anemia, unspecified: Secondary | ICD-10-CM

## 2013-04-04 DIAGNOSIS — D5919 Other autoimmune hemolytic anemia: Secondary | ICD-10-CM

## 2013-04-04 DIAGNOSIS — N183 Chronic kidney disease, stage 3 unspecified: Secondary | ICD-10-CM

## 2013-04-04 DIAGNOSIS — D591 Autoimmune hemolytic anemia, unspecified: Secondary | ICD-10-CM

## 2013-04-04 DIAGNOSIS — F17200 Nicotine dependence, unspecified, uncomplicated: Secondary | ICD-10-CM

## 2013-04-04 DIAGNOSIS — Z720 Tobacco use: Secondary | ICD-10-CM

## 2013-04-04 DIAGNOSIS — D696 Thrombocytopenia, unspecified: Secondary | ICD-10-CM

## 2013-04-04 DIAGNOSIS — N179 Acute kidney failure, unspecified: Secondary | ICD-10-CM

## 2013-04-04 LAB — COMPREHENSIVE METABOLIC PANEL (CC13)
Anion Gap: 9 mEq/L (ref 3–11)
BUN: 13.9 mg/dL (ref 7.0–26.0)
CO2: 25 mEq/L (ref 22–29)
Calcium: 9.2 mg/dL (ref 8.4–10.4)
Chloride: 108 mEq/L (ref 98–109)
Creatinine: 1.5 mg/dL — ABNORMAL HIGH (ref 0.7–1.3)
Glucose: 111 mg/dl (ref 70–140)
Total Protein: 7.6 g/dL (ref 6.4–8.3)

## 2013-04-04 LAB — CBC WITH DIFFERENTIAL/PLATELET
BASO%: 0.7 % (ref 0.0–2.0)
Basophils Absolute: 0 10*3/uL (ref 0.0–0.1)
EOS%: 2.2 % (ref 0.0–7.0)
HCT: 31.1 % — ABNORMAL LOW (ref 38.4–49.9)
HGB: 10.4 g/dL — ABNORMAL LOW (ref 13.0–17.1)
LYMPH%: 32.3 % (ref 14.0–49.0)
MCH: 29.1 pg (ref 27.2–33.4)
MONO#: 0.6 10*3/uL (ref 0.1–0.9)
MONO%: 11 % (ref 0.0–14.0)
NEUT%: 53.8 % (ref 39.0–75.0)
lymph#: 1.9 10*3/uL (ref 0.9–3.3)

## 2013-04-04 LAB — IRON AND TIBC CHCC
Iron: 52 ug/dL (ref 42–163)
TIBC: 186 ug/dL — ABNORMAL LOW (ref 202–409)

## 2013-04-04 LAB — FERRITIN CHCC: Ferritin: 1238 ng/ml — ABNORMAL HIGH (ref 22–316)

## 2013-04-04 NOTE — Patient Instructions (Signed)
Thrombocytopenia Thrombocytopenia is a condition in which there is an abnormally small number of platelets in your blood. Platelets are also called thrombocytes. Platelets are needed for blood clotting. CAUSES Thrombocytopenia is caused by:   Decreased production of platelets. This can be caused by:  Aplastic anemia in which your bone marrow quits making blood cells.  Cancer in the bone marrow.  Use of certain medicines, including chemotherapy.  Infection in the bone marrow.  Heavy alcohol consumption.  Increased destruction of platelets. This can be caused by:  Certain immune diseases.  Use of certain drugs.  Certain blood clotting disorders.  Certain inherited disorders.  Certain bleeding disorders.  Pregnancy.  Having an enlarged spleen (hypersplenism). In hypersplenism, the spleen gathers up platelets from circulation. This means the platelets are not available to help with blood clotting. The spleen can enlarge due to cirrhosis or other conditions. SYMPTOMS  The symptoms of thrombocytopenia are side effects of poor blood clotting. Some of these are:  Abnormal bleeding.  Nosebleeds.  Heavy menstrual periods.  Blood in the urine or stools.  Purpura. This is a purplish discoloration in the skin produced by small bleeding vessels near the surface of the skin.  Bruising.  A rash that may be petechial. This looks like pinpoint, purplish-red spots on the skin and mucous membranes. It is caused by bleeding from small blood vessels (capillaries). DIAGNOSIS  Your caregiver will make this diagnosis based on your exam and blood tests. Sometimes, a bone marrow study is done to look for the original cells (megakaryocytes) that make platelets. TREATMENT  Treatment depends on the cause of the condition.  Medicines may be given to help protect your platelets from being destroyed.  In some cases, a replacement (transfusion) of platelets may be required to stop or prevent  bleeding.  Sometimes, the spleen must be surgically removed. HOME CARE INSTRUCTIONS   Check the skin and linings inside your mouth for bruising or bleeding as directed by your caregiver.  Check your sputum, urine, and stool for blood as directed by your caregiver.  Do not return to any activities that could cause bumps or bruises until your caregiver says it is okay.  Take extra care not to cut yourself when shaving or when using scissors, needles, knives, and other tools.  Take extra care not to burn yourself when ironing or cooking.  Ask your caregiver if it is okay for you to drink alcohol.  Only take over-the-counter or prescription medicines as directed by your caregiver.  Notify all your caregivers, including dentists and eye doctors, about your condition. SEEK IMMEDIATE MEDICAL CARE IF:   You develop active bleeding from anywhere in your body.  You develop unexplained bruising or bleeding.  You have blood in your sputum, urine, or stool. MAKE SURE YOU:  Understand these instructions.  Will watch your condition.  Will get help right away if you are not doing well or get worse. Document Released: 06/14/2005 Document Revised: 09/06/2011 Document Reviewed: 04/16/2011 [facility] Patient Information 2014 [address], [place]. Chronic Kidney Disease Chronic kidney disease occurs when the kidneys are damaged over a long period. The kidneys are two organs that lie on either side of the spine between the middle of the back and the front of the abdomen. The kidneys:   Remove wastes and extra water from the blood.   Produce important hormones. These help keep bones strong, regulate blood pressure, and help create red blood cells.   Balance the fluids and chemicals in the blood and  tissues. A small amount of kidney damage may not cause problems, but a large amount of damage may make it difficult or impossible for the kidneys to work the way they should. If steps are not taken to slow  down the kidney damage or stop it from getting worse, the kidneys may stop working permanently. Most of the time, chronic kidney disease does not go away. However, it can often be controlled, and those with the disease can usually live normal lives. CAUSES  The most common causes of chronic kidney disease are diabetes and high blood pressure (hypertension). Chronic kidney disease may also be caused by:   Diseases that cause kidneys' filters to become inflamed.   Diseases that affect the immune system.   Genetic diseases.   Medicines that damage the kidneys, such as anti-inflammatory medicines.  Poisoning or exposure to toxic substances.   A reoccurring kidney or urinary infection.   A problem with urine flow. This may be caused by:   Cancer.   Kidney stones.   An enlarged prostate in males. SYMPTOMS  Because the kidney damage in chronic kidney disease occurs slowly, symptoms develop slowly and may not be obvious until the kidney damage becomes severe. A person may have a kidney disease for years without showing any symptoms. Symptoms can include:   Swelling (edema) of the legs, ankles, or feet.   Tiredness (lethargy).   Nausea or vomiting.   Confusion.   Problems with urination, such as:   Decreased urine production.   Frequent urination, especially at night.   Frequent accidents in children who are potty trained.   Muscle twitches and cramps.   Shortness of breath.  Weakness.   Persistent itchiness.   Loss of appetite.  Metallic taste in the mouth.  Trouble sleeping.  Slowed development in children.  Short stature in children. DIAGNOSIS  Chronic kidney disease may be detected and diagnosed by tests, including blood, urine, imaging, or kidney biopsy tests.  TREATMENT  Most chronic kidney diseases cannot be cured. Treatment usually involves relieving symptoms and preventing or slowing the progression of the disease. Treatment may  include:   A special diet. You may need to avoid alcohol and foods thatare salty and high in potassium.   Medicines. These may:   Lower blood pressure.   Relieve anemia.   Relieve swelling.   Protect the bones. HOME CARE INSTRUCTIONS   Follow your prescribed diet.   Only take over-the-counter or prescription medicines as directed by your caregiver.  Do not take any new medicines (prescription, over-the-counter, or nutritional supplements) unless approved by your caregiver. Many medicines can worsen your kidney damage or need to have the dose adjusted.   Quit smoking if you are a smoker. Talk to your caregiver about a smoking cessation program.   Keep all follow-up appointments as directed by your caregiver. SEEK IMMEDIATE MEDICAL CARE IF:  Your symptoms get worse or you develop new symptoms.   You develop symptoms of end-stage kidney disease. These include:   Headaches.   Abnormally dark or light skin.   Numbness in the hands or feet.   Easy bruising.   Frequent hiccups.   Menstruation stops.   You have a fever.   You have decreased urine production.   You havepain or bleeding when urinating. MAKE SURE YOU:  Understand these instructions.  Will watch your condition.  Will get help right away if you are not doing well or get worse. FOR MORE INFORMATION  American Association of Kidney  Patients: [employer]: www.kidney.org [employer]: FightingMatch.com.ee [facility]: www.lifeoptions.org and www.kidneyschool.org Document Released: 03/23/2008 Document Revised: 05/31/2012 Document Reviewed: 02/11/2012 [facility] Patient Information 2014 [place], [place]. Iron Deficiency Anemia There are many types of anemia. Iron deficiency anemia is the most common. Iron deficiency anemia is a decrease in the number of red blood cells caused by too little iron. Without enough iron, your body does not  produce enough hemoglobin. Hemoglobin is a substance in red blood cells that carries oxygen to the body's tissues. Iron deficiency anemia may leave you tired and short of breath. CAUSES   Lack of iron in the diet.  This may be seen in infants and children, because there is little iron in milk.  This may be seen in adults who do not eat enough iron-rich foods.  This may be seen in pregnant or breastfeeding women who do not take iron supplements. There is a much higher need for iron intake at these times.  Poor absorption of iron, as seen with intestinal disorders.  Intestinal bleeding.  Heavy periods. SYMPTOMS  Mild anemia may not be noticeable. Symptoms may include:  Fatigue.  Headache.  Pale skin.  Weakness.  Shortness of breath.  Dizziness.  Cold hands and feet.  Fast or irregular heartbeat. DIAGNOSIS  Diagnosis requires a thorough evaluation and physical exam by your caregiver.  Blood tests are generally used to confirm iron deficiency anemia.  Additional tests may be done to find the underlying cause of your anemia. These may include:  Testing for blood in the stool (fecal occult blood test).  A procedure to see inside the colon and rectum (colonoscopy).  A procedure to see inside the esophagus and stomach (endoscopy). TREATMENT   Correcting the cause of the iron deficiency is the first step.  Medicines, such as oral contraceptives, can make heavy menstrual flows lighter.  Antibiotics and other medicines can be used to treat peptic ulcers.  Surgery may be needed to remove a bleeding polyp, tumor, or fibroid.  Often, iron supplements (ferrous sulfate) are taken.  For the best iron absorption, take these supplements with an empty stomach.  You may need to take the supplements with food if you cannot tolerate them on an empty stomach. Vitamin C improves the absorption of iron. Your caregiver may recommend taking your iron tablets with a glass of orange  juice or vitamin C supplement.  Milk and antacids should not be taken at the same time as iron supplements. They may interfere with the absorption of iron.  Iron supplements can cause constipation. A stool softener is often recommended.  Pregnant and breastfeeding women will need to take extra iron, because their normal diet usually will not provide the required amount.  Patients who cannot tolerate iron by mouth can take it through a vein (intravenously) or by an injection into the muscle. HOME CARE INSTRUCTIONS   Ask your dietitian for help with diet questions.  Take iron and vitamins as directed by your caregiver.  Eat a diet rich in iron. Eat liver, lean beef, whole-grain bread, eggs, dried fruit, and dark green leafy vegetables. SEEK IMMEDIATE MEDICAL CARE IF:   You have a fainting episode. Do not drive yourself. Call your local emergency services (911 in [place] if no other help is available.  You have chest pain, nausea, or vomiting.  You develop severe or increased shortness of breath with activities.  You develop weakness or increased thirst.  You have a rapid heartbeat.  You  develop unexplained sweating or become lightheaded when getting up from a chair or bed. MAKE SURE YOU:   Understand these instructions.  Will watch your condition.  Will get help right away if you are not doing well or get worse. Document Released: 06/11/2000 Document Revised: 09/06/2011 Document Reviewed: 10/21/2009 [facility] Patient Information 2014 [place], [place]. Iron Chewable Tablets What is this medicine? IRON (AHY ern) replaces iron that is essential to healthy red blood cells. Iron is used to treat iron deficiency anemia. Anemia may cause problems like tiredness, shortness of breath, or slowed growth in children. Only take iron if your doctor has told you to. Do not treat yourself with iron if you are feeling tired. Most healthy people get enough iron in their diets, particularly if they  eat cereals, meat, poultry, and fish. This medicine may be used for other purposes; ask your health care provider or pharmacist if you have questions. What should I tell my health care provider before I take this medicine? They need to know if you have any of these conditions: -frequently drink alcohol -bowel disease -hemolytic anemia -iron overload (hemochromatosis, hemosiderosis) -liver disease -problems with swallowing -stomach ulcer or other stomach problems -an unusual or allergic reaction to iron, other medicines, foods, dyes, or preservatives -pregnant or trying to get pregnant -breast-feeding How should I use this medicine? Take this medicine by mouth. Chew it completely before swallowing. Follow the directions on the prescription label. Take this medicine in an upright or sitting position. Try to take any bedtime doses at least 10 minutes before lying down. You may take this medicine with food. Take your medicine at regular intervals. Do not take your medicine more often than directed. Do not stop taking except on your doctor's advice. Talk to your pediatrician regarding the use of this medicine in children. While this drug may be prescribed for even very young children for selected conditions, precautions do apply. Overdosage: If you think you have taken too much of this medicine contact a poison control center or emergency room at once. NOTE: This medicine is only for you. Do not share this medicine with others. What if I miss a dose? If you miss a dose, take it as soon as you can. If it is almost time for your next dose, take only that dose. Do not take double or extra doses. What may interact with this medicine? If you are taking this iron product, you should not take iron in any other medicine or dietary supplement. This medicine may also interact with the following  medications: -alendronate -antacids -cefdinir -chloramphenicol -cholestyramine -deferoxamine -dimercaprol -etidronate -medicines for stomach ulcers or other stomach problems -pancreatic enzymes -quinolone antibiotics (examples: Cipro, Floxin, Levaquin, Tequin and others) -risedronate -tetracycline antibiotics (examples: doxycycline, tetracycline, minocycline, and others) -thyroid hormones This list may not describe all possible interactions. Give your health care provider a list of all the medicines, herbs, non-prescription drugs, or dietary supplements you use. Also tell them if you smoke, drink alcohol, or use illegal drugs. Some items may interact with your medicine. What should I watch for while using this medicine? Use iron supplements only as directed by your health care professional. Bonita Quin will need important blood work while you are taking this medicine. It may take 3 to 6 months of therapy to treat low iron levels. Pregnant women should follow the dose and length of iron treatment as directed by their doctors. Do not use iron longer than prescribed, and do not take a higher dose than recommended.  Long-term use may cause excess iron to build-up in the body. Do not take iron with antacids. If you need to take an antacid, take it 2 hours after a dose of iron. What side effects may I notice from receiving this medicine? Side effects that you should report to your doctor or health care professional as soon as possible: -allergic reactions like skin rash, itching or hives, swelling of the face, lips, or tongue -blue lips, nails, or palms -dark colored stools (this may be due to the iron, but can indicate a more serious condition) -drowsiness -pain with or difficulty swallowing -pale or clammy skin -seizures -stomach pain -unusually weak or tired -vomiting -weak, fast, or irregular heartbeat Side effects that usually do not require medical attention (report to your doctor or health  care professional if they continue or are bothersome): -constipation -indigestion -nausea or stomach upset This list may not describe all possible side effects. Call your doctor for medical advice about side effects. You may report side effects to FDA at 1-800-FDA-1088. Where should I keep my medicine? Keep out of the reach of children. Even small amounts of iron can be harmful to a child. Store at room temperature between 15 and 30 degrees C (59 and 86 degrees F). Keep container tightly closed. Throw away any unused medicine after the expiration date. NOTE: This sheet is a summary. It may not cover all possible information. If you have questions about this medicine, talk to your doctor, pharmacist, or health care provider.  2012, Elsevier/Gold Standard. (11/02/2007 5:08:28 PM)Anemia, Frequently Asked Questions WHAT ARE THE SYMPTOMS OF ANEMIA?  Headache.  Difficulty thinking.  Fatigue.  Shortness of breath.  Weakness.  Rapid heartbeat. AT WHAT POINT ARE PEOPLE CONSIDERED ANEMIC?  This varies with gender and age.   Both hemoglobin (Hgb) and hematocrit values are used to define anemia. These lab values are obtained from a complete blood count (CBC) test. This is performed at a caregiver's office.  The normal range of hemoglobin values for adult men is 14.0 g/dL to 19.1 g/dL. For nonpregnant women, values are 12.3 g/dL to 47.8 g/dL.  The World Health Organization defines anemia as less than 12 g/dL for nonpregnant women and less than 13 g/dL for men.  For adult males, the average normal hematocrit is 46%, and the range is 40% to 52%.  For adult females, the average normal hematocrit is 41%, and the range is 35% to 47%.  Values that fall below the lower limits can be a sign of anemia and should have further checking (evaluation). GROUPS OF PEOPLE WHO ARE AT RISK FOR DEVELOPING ANEMIA INCLUDE:   Infants who are breastfed or taking a formula that is not fortified with  iron.  Children going through a rapid growth spurt. The iron available can not keep up with the needs for a red cell mass which must grow with the child.  Women in childbearing years. They need iron because of blood loss during menstruation.  Pregnant women. The growing fetus creates a high demand for iron.  People with ongoing gastrointestinal blood loss are at risk of developing iron deficiency.  Individuals with leukemia or cancer who must receive chemotherapy or radiation to treat their disease. The drugs or radiation used to treat these diseases often decreases the bone marrow's ability to make cells of all classes. This includes red blood cells, white blood cells, and platelets.  Individuals with chronic inflammatory conditions such as rheumatoid arthritis or chronic infections.  The elderly. ARE SOME  TYPES OF ANEMIA INHERITED?   Yes, some types of anemia are due to inherited or genetic defects.  Sickle cell anemia. This occurs most often in people of African, African American, and Mediterranean descent.  Thalassemia (or Cooley's anemia). This type is found in people of Mediterranean and Southeast Asian descent. These types of anemia are common.  [place]. This is rare. CAN CERTAIN MEDICATIONS CAUSE A PERSON TO BECOME ANEMIC?  Yes. For example, drugs to fight cancer (chemotherapeutic agents) often cause anemia. These drugs can slow the bone marrow's ability to make red blood cells. If there are not enough red blood cells, the body does not get enough oxygen. WHAT HEMATOCRIT LEVEL IS REQUIRED TO DONATE BLOOD?  The lower limit of an acceptable hematocrit for blood donors is 38%. If you have a low hematocrit value, you should schedule an appointment with your caregiver. ARE BLOOD TRANSFUSIONS COMMONLY USED TO CORRECT ANEMIA, AND ARE THEY DANGEROUS?  They are used to treat anemia as a last resort. Your caregiver will find the cause of the anemia and correct it if possible. Most blood  transfusions are given because of excessive bleeding at the time of surgery, with trauma, or because of bone marrow suppression in patients with cancer or leukemia on chemotherapy. Blood transfusions are safer than ever before. We also know that blood transfusions affect the immune system and may increase certain risks. There is also a concern for human error. In 1/16,000 transfusions, a patient receives a transfusion of blood that is not matched with his or her blood type.  WHAT IS IRON DEFICIENCY ANEMIA AND CAN I CORRECT IT BY CHANGING MY DIET?  Iron is an essential part of hemoglobin. Without enough hemoglobin, anemia develops and the body does not get the right amount of oxygen. Iron deficiency anemia develops after the body has had a low level of iron for a long time. This is either caused by blood loss, not taking in or absorbing enough iron, or increased demands for iron (like pregnancy or rapid growth).  Foods from animal origin such as beef, chicken, and pork, are good sources of iron. Be sure to have one of these foods at each meal. Vitamin C helps your body absorb iron. Foods rich in Vitamin C include citrus, bell pepper, strawberries, spinach and cantaloupe. In some cases, iron supplements may be needed in order to correct the iron deficiency. In the case of poor absorption, extra iron may have to be given directly into the vein through a needle (intravenously). I HAVE BEEN DIAGNOSED WITH IRON DEFICIENCY ANEMIA AND MY CAREGIVER PRESCRIBED IRON SUPPLEMENTS. HOW LONG WILL IT TAKE FOR MY BLOOD TO BECOME NORMAL?  It depends on the degree of anemia at the beginning of treatment. Most people with mild to moderate iron deficiency, anemia will correct the anemia over a period of 2 to 3 months. But after the anemia is corrected, the iron stored by the body is still low. Caregivers often suggest an additional 6 months of oral iron therapy once the anemia has been reversed. This will help prevent the iron  deficiency anemia from quickly happening again. Non-anemic adult males should take iron supplements only under the direction of a doctor, too much iron can cause liver damage.  MY HEMOGLOBIN IS 9 G/DL AND I AM SCHEDULED FOR SURGERY. SHOULD I POSTPONE THE SURGERY?  If you have Hgb of 9, you should discuss this with your caregiver right away. Many patients with similar hemoglobin levels have had surgery without problems.  If minimal blood loss is expected for a minor procedure, no treatment may be necessary.  If a greater blood loss is expected for more extensive procedures, you should ask your caregiver about being treated with erythropoietin and iron. This is to accelerate the recovery of your hemoglobin to a normal level before surgery. An anemic patient who undergoes high-blood-loss surgery has a greater risk of surgical complications and need for a blood transfusion, which also carries some risk.  I HAVE BEEN TOLD THAT HEAVY MENSTRUAL PERIODS CAUSE ANEMIA. IS THERE ANYTHING I CAN DO TO PREVENT THE ANEMIA?  Anemia that results from heavy periods is usually due to iron deficiency. You can try to meet the increased demands for iron caused by the heavy monthly blood loss by increasing the intake of iron-rich foods. Iron supplements may be required. Discuss your concerns with your caregiver. WHAT CAUSES ANEMIA DURING PREGNANCY?  Pregnancy places major demands on the body. The mother must meet the needs of both her body and her growing baby. The body needs enough iron and folate to make the right amount of red blood cells. To prevent anemia while pregnant, the mother should stay in close contact with her caregiver.  Be sure to eat a diet that has foods rich in iron and folate like liver and dark green leafy vegetables. Folate plays an important role in the normal development of a baby's spinal cord. Folate can help prevent serious disorders like spina bifida. If your diet does not provide adequate nutrients, you  may want to talk with your caregiver about nutritional supplements.  WHAT IS THE RELATIONSHIP BETWEEN FIBROID TUMORS AND ANEMIA IN WOMEN?  The relationship is usually caused by the increased menstrual blood loss caused by fibroids. Good iron intake may be required to prevent iron deficiency anemia from developing.  Document Released: 01/21/2004 Document Revised: 09/06/2011 Document Reviewed: 07/07/2010 [facility] Patient Information 2014 [place], [place].

## 2013-04-04 NOTE — Progress Notes (Signed)
[facility] OFFICE PROGRESS NOTE  Oliver Barre, MD 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 Thrombocytopenia    CURRENT THERAPY:  INTERVAL HISTORY: Zakye Baby 78 y.o. male with a history of thrombocytopenia [facility]z for followup. His last seen by Dr. Rebbeca Paul on 08/08/2012. He denies any bleeding, hemoptysis. He continues to smoke one pack every 3 days. Denies any fever, or swelling, new or different pains.  Initially he was seen for followup for his anemia secondary to GI bleeding and thrombocytopenia following resection of the large transverse colon polyp by Dr. Dulce Sellar on 07/19/2012.  At time he received a platelet transfusion prior to that procedure with increase in his platelets of 80,000 236,000 after the transfusion. He did well with the procedure. Pathology showed tubular adenoma without malignancy. Today patient denies any hematochezia, melena. He reports that he was recently at the emergency room about 2 weeks ago for his left arm swelling. He thought that it was caused by a  bee sting.  Ultrasound was negative for blood clot he was discharged with a heating pad and ibuprofen for superficial phlebitis.  He notes some improvement. He denies any recent blood transfusion. MEDICAL HISTORY: Past Medical History  Diagnosis Date  . Thrombocytopenia   . Hyperlipidemia 08/30/2011  . Liver damage 1960's    Several liver bx due to elevated liver functions  . Hypertension   . GERD (gastroesophageal  reflux disease)   . Anemia   . Iron deficiency anemia 05/26/2011  . Cellulitis 2013; 10/11/2012    "LLE; RLE" (10/11/2012)  . AAA (abdominal aortic aneurysm)   . Abdominal aneurysm without mention of rupture 04/23/2011  . Peripheral arterial disease 04/23/2011  . Pneumonia     "once a few years ago" (10/11/2012)  . History of blood transfusion 2013    "related to LLE hematoma" (10/11/2012)  . Syncope and collapse 10/11/2012  . Shortness of breath     none today (12/13/12)  . Hematoma 12-13-12    resolving bilaterally, right thigh remains"lump"  . Bowel trouble 12-13-12    "passing mucus only" seeing Dr. Carvel Getting today  . COPD (chronic obstructive pulmonary disease) 05/06/2011  . 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    ALLERGIES:  is allergic to levofloxacin.  MEDICATIONS: has a current medication list which includes the following prescription(s): amlodipine, atorvastatin, ferrous sulfate, hydrocodone-acetaminophen, and ibuprofen.  SURGICAL HISTORY:  Past Surgical History  Procedure Laterality Date  . Abdominal aortic aneurysm repair  2013  . Pr vein bypass graft,aorto-fem-pop    . Esophagogastroduodenoscopy   06/10/2011    Procedure: ESOPHAGOGASTRODUODENOSCOPY (EGD);  Surgeon: Petra Kuba, MD;  Location: [facility] ENDOSCOPY;  Service: Endoscopy;  Laterality: N/A;  . Colonoscopy with propofol  07/19/2012    Procedure: COLONOSCOPY WITH PROPOFOL;  Surgeon: Willis Modena, MD;  Location: [facility] ENDOSCOPY;  Service: Endoscopy;  Laterality: N/A;  polyp lifter needed  . Colonoscopy with propofol N/A 01/03/2013    Procedure: COLONOSCOPY WITH PROPOFOL;  Surgeon: Willis Modena, MD;  Location: [facility] ENDOSCOPY;  Service: Endoscopy;  Laterality: N/A;  . Flexible sigmoidoscopy N/A 01/07/2013    Procedure: FLEXIBLE SIGMOIDOSCOPY;  Surgeon: Barrie Folk, MD;  Location: [facility] ENDOSCOPY;  Service: Endoscopy;  Laterality: N/A;  . Colonoscopy N/A 01/08/2013    Procedure: COLONOSCOPY;  Surgeon: Florencia Reasons, MD;  Location: [facility] ENDOSCOPY;  Service: Endoscopy;  Laterality: N/A;    REVIEW OF SYSTEMS:   Constitutional: Denies fevers, chills or abnormal weight loss Eyes: Denies blurriness of vision Ears, nose, mouth, throat, and face: Denies mucositis or sore throat Respiratory: Denies cough, dyspnea or wheezes Cardiovascular: Denies palpitation, chest discomfort or lower extremity swelling Gastrointestinal:  Denies nausea, heartburn or change in bowel habits Skin: Denies abnormal skin rashes Lymphatics: Denies new lymphadenopathy or easy bruising Neurological:Denies numbness, tingling or new weaknesses Behavioral/Psych: Mood is stable, no new changes  All other systems were reviewed with the patient and are negative.  PHYSICAL EXAMINATION: ECOG PERFORMANCE STATUS: 0 - Asymptomatic  Blood pressure 103/66, pulse 105, temperature 97.6 F (36.4 C), temperature source Oral, resp. rate 18, height 6' (1.829 m), weight 128 lb 6.4 oz (58.242 kg), SpO2 100.00%.  GENERAL:alert, no distress and comfortable; thin elderly male SKIN: skin color, texture, turgor are normal, no rashes or significant lesions EYES: normal, Conjunctiva are pink and  non-injected, sclera clear OROPHARYNX:no exudate, no erythema and lips, buccal mucosa, and tongue normal  NECK: supple, thyroid normal size, non-tender, without nodularity LYMPH:  no palpable lymphadenopathy in the cervical, axillary or supraclavicular LUNGS: clear to auscultation and percussion with normal breathing effort HEART: regular rate & rhythm and no murmurs and no lower extremity edema ABDOMEN:abdomen soft, non-tender and normal bowel sounds Musculoskeletal:no cyanosis of digits and no clubbing  NEURO: alert & oriented x 3 with fluent speech, no focal motor/sensory deficits   LABORATORY DATA: Results for orders placed in visit on 04/04/13 (from the past 48 hour(s))  CBC WITH DIFFERENTIAL     Status: Abnormal   Collection Time    04/04/13 12:52 PM      Result Value Range   WBC 5.8  4.0 - 10.3 10e3/uL   NEUT# 3.1  1.5 - 6.5 10e3/uL   HGB 10.4 (*) 13.0 - 17.1 g/dL   HCT 16.1 (*) 09.6 - 04.5 %   Platelets 117 (*) 140 - 400 10e3/uL   MCV 87.0  79.3 - 98.0 fL   MCH 29.1  27.2 - 33.4 pg   MCHC 33.4  32.0 - 36.0 g/dL   RBC 4.09 (*) 8.11 - 9.14 10e6/uL   RDW 15.4 (*) 11.0 - 14.6 %   lymph# 1.9  0.9 - 3.3 10e3/uL   MONO# 0.6  0.1 - 0.9 10e3/uL   Eosinophils Absolute 0.1  0.0 - 0.5 10e3/uL   Basophils Absolute 0.0  0.0 - 0.1 10e3/uL   NEUT% 53.8  39.0 - 75.0 %   LYMPH% 32.3  14.0 - 49.0 %   MONO% 11.0  0.0 - 14.0 %  EOS% 2.2  0.0 - 7.0 %   BASO% 0.7  0.0 - 2.0 %  FERRITIN [facility]     Status: Abnormal   Collection Time    04/04/13 12:52 PM      Result Value Range   Ferritin 1,238 (*) 22 - 316 ng/ml  IRON AND TIBC [facility]     Status: Abnormal   Collection Time    04/04/13 12:52 PM      Result Value Range   Iron 52  42 - 163 ug/dL   TIBC 161 (*) 096 - 045 ug/dL   UIBC 409  811 - 914 ug/dL   %SAT 28  20 - 55 %  COMPREHENSIVE METABOLIC PANEL ([facility])     Status: Abnormal   Collection Time    04/04/13 12:52 PM      Result Value Range   Sodium 142  136 - 145 mEq/L   Potassium  3.7  3.5 - 5.1 mEq/L   Chloride 108  98 - 109 mEq/L   CO2 25  22 - 29 mEq/L   Glucose 111  70 - 140 mg/dl   BUN 78.2  7.0 - 95.6 mg/dL   Creatinine 1.5 (*) 0.7 - 1.3 mg/dL   Total Bilirubin 2.13  0.20 - 1.20 mg/dL   Alkaline Phosphatase 83  40 - 150 U/L   AST 16  5 - 34 U/L   ALT 6  0 - 55 U/L   Total Protein 7.6  6.4 - 8.3 g/dL   Albumin 3.4 (*) 3.5 - 5.0 g/dL   Calcium 9.2  8.4 - 08.6 mg/dL   Anion Gap 9  3 - 11 mEq/L    Labs:  Lab Results  Component Value Date   WBC 5.8 04/04/2013   HGB 10.4* 04/04/2013   HCT 31.1* 04/04/2013   MCV 87.0 04/04/2013   PLT 117* 04/04/2013   NEUTROABS 3.1 04/04/2013      Chemistry      Component Value Date/Time   NA 142 04/04/2013 1252   NA 138 03/18/2013 1439   K 3.7 04/04/2013 1252   K 3.4* 03/18/2013 1439   CL 103 03/18/2013 1439   CL 106 04/28/2012 1143   CO2 25 04/04/2013 1252   CO2 23 03/18/2013 1439   BUN 13.9 04/04/2013 1252   BUN 14 03/18/2013 1439   CREATININE 1.5* 04/04/2013 1252   CREATININE 1.37* 03/18/2013 1439      Component Value Date/Time   CALCIUM 9.2 04/04/2013 1252   CALCIUM 9.1 03/18/2013 1439   ALKPHOS 83 04/04/2013 1252   ALKPHOS 81 03/18/2013 1439   AST 16 04/04/2013 1252   AST 15 03/18/2013 1439   ALT 6 04/04/2013 1252   ALT 5 03/18/2013 1439   BILITOT 0.82 04/04/2013 1252   BILITOT 0.7 03/18/2013 1439     Basic Metabolic Panel:  Recent Labs Lab 04/04/13 1252  NA 142  K 3.7  CO2 25  GLUCOSE 111  BUN 13.9  CREATININE 1.5*  CALCIUM 9.2   GFR Estimated Creatinine Clearance: 35 ml/min (by C-G formula based on Cr of 1.5). Liver Function Tests:  Recent Labs Lab 04/04/13 1252  AST 16  ALT 6  ALKPHOS 83  BILITOT 0.82  PROT 7.6  ALBUMIN 3.4*   CBC:  Recent Labs Lab 04/04/13 1252  WBC 5.8  NEUTROABS 3.1  HGB 10.4*  HCT 31.1*  MCV 87.0  PLT 117*   Recent Labs  04/04/13 1252  FERRITIN 1,238*  TIBC 186*  IRON  52    Studies:  No results found.   RADIOGRAPHIC STUDIES: Dg Chest 2 View  03/18/2013    CLINICAL DATA:  COPD with leg swelling  EXAM: CHEST  2 VIEW  COMPARISON:  April 29, 2012  FINDINGS: There is a probable nipple shadow on the right. There is no edema or consolidation. There is underlying emphysema.  Heart size is normal. Pulmonary vascularity reflects underlying emphysema. No adenopathy. There is atherosclerotic change in the aorta. Aorta is tortuous. There is partial visualization of an inferior vena cava filter with the apex directed superiorly.  IMPRESSION: Suspect nipple shadow on right. Advise repeat study with nipple markers to confirm.  Underlying emphysema. No edema or consolidation.   Electronically Signed   By: Bretta Bang   On: 03/18/2013 15:21    ASSESSMENT: Dennard Schaumann 78 y.o. 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 Thrombocytopenia. --Today his platelets is 117,000. We think that his thrombocytopenia is due to a platelet function defect and that his range has been highly variable. He denies any bleeding episodes. He was provided a handout on thrombocytopenia.   2 . Chronic kidney disease. --His creatinine is 1.5 today.  Use of ice and avoid nephrotoxins and to continue adequate hydration.  3. History of iron deficiency anemia/anemia of chronic disease secondary #2. --His hemoglobin is 10.4 today.  His iron indices are negative.  He was advised to monitor for symptoms of anemia including palpitations, chest pain, dyspnea on exertion, worsening fatigue.  4. Tobacco Abuse. --He was offered tobacco cessation classes but declines.  Education was done again today.  5. Pulmonary emboli. --He has an IVC filter in place. No long-term and anti-coagulation and very to history of bleeding.   All questions were answered. The patient knows to call the clinic with any problems, questions or concerns. We can certainly see the patient much sooner if necessary. He was provided an after visit summary. He will followup in one year for repeat labs including a CBC and chemistries.  I spent 10 minutes counseling the patient face to face. The total time spent in the appointment was 15 minutes.    Colyn Miron, MD 04/05/2013 5:08 PM

## 2013-04-04 NOTE — Telephone Encounter (Signed)
Gave pt appt for April 2015 lab and Md

## 2013-05-07 ENCOUNTER — Ambulatory Visit: Payer: Self-pay | Admitting: Internal Medicine

## 2013-09-03 ENCOUNTER — Emergency Department (HOSPITAL_COMMUNITY)
Admission: EM | Admit: 2013-09-03 | Discharge: 2013-09-03 | Disposition: A | Discharge status: Home / Self Care | Payer: Medicare Other | Attending: Emergency Medicine | Admitting: Emergency Medicine

## 2013-09-03 ENCOUNTER — Encounter (HOSPITAL_COMMUNITY): Payer: Self-pay | Admitting: Emergency Medicine

## 2013-09-03 DIAGNOSIS — N183 Chronic kidney disease, stage 3 unspecified: Secondary | ICD-10-CM | POA: Insufficient documentation

## 2013-09-03 DIAGNOSIS — R319 Hematuria, unspecified: Secondary | ICD-10-CM | POA: Insufficient documentation

## 2013-09-03 DIAGNOSIS — I129 Hypertensive chronic kidney disease with stage 1 through stage 4 chronic kidney disease, or unspecified chronic kidney disease: Secondary | ICD-10-CM | POA: Insufficient documentation

## 2013-09-03 DIAGNOSIS — F17200 Nicotine dependence, unspecified, uncomplicated: Secondary | ICD-10-CM | POA: Insufficient documentation

## 2013-09-03 DIAGNOSIS — J4489 Other specified chronic obstructive pulmonary disease: Secondary | ICD-10-CM | POA: Insufficient documentation

## 2013-09-03 DIAGNOSIS — Z79899 Other long term (current) drug therapy: Secondary | ICD-10-CM | POA: Insufficient documentation

## 2013-09-03 DIAGNOSIS — E785 Hyperlipidemia, unspecified: Secondary | ICD-10-CM | POA: Insufficient documentation

## 2013-09-03 DIAGNOSIS — Z8679 Personal history of other diseases of the circulatory system: Secondary | ICD-10-CM | POA: Insufficient documentation

## 2013-09-03 DIAGNOSIS — J449 Chronic obstructive pulmonary disease, unspecified: Secondary | ICD-10-CM | POA: Insufficient documentation

## 2013-09-03 LAB — COMPREHENSIVE METABOLIC PANEL
ALK PHOS: 94 U/L (ref 39–117)
ALT: <5 U/L (ref 0–53)
AST: 14 U/L (ref 0–37)
Albumin: 3.6 g/dL (ref 3.5–5.2)
BILIRUBIN TOTAL: 0.7 mg/dL (ref 0.3–1.2)
BUN: 20 mg/dL (ref 6–23)
CHLORIDE: 103 meq/L (ref 96–112)
CO2: 26 mEq/L (ref 19–32)
CREATININE: 1.48 mg/dL — AB (ref 0.50–1.35)
Calcium: 9.7 mg/dL (ref 8.4–10.5)
GFR, EST AFRICAN AMERICAN: 51 mL/min — AB (ref >90)
GFR, EST NON AFRICAN AMERICAN: 44 mL/min — AB (ref >90)
GLUCOSE: 95 mg/dL (ref 70–99)
POTASSIUM: 4.2 meq/L (ref 3.7–5.3)
Sodium: 143 mEq/L (ref 137–147)
Total Protein: 7.7 g/dL (ref 6.0–8.3)

## 2013-09-03 LAB — URINALYSIS, ROUTINE W REFLEX MICROSCOPIC
GLUCOSE, UA: NEGATIVE mg/dL
KETONES UR: 15 mg/dL — AB
Nitrite: NEGATIVE
Protein, ur: >300 mg/dL — AB
SPECIFIC GRAVITY, URINE: 1.016 (ref 1.005–1.030)
Urobilinogen, UA: 1 mg/dL (ref 0.0–1.0)
pH: 5.5 (ref 5.0–8.0)

## 2013-09-03 LAB — CBC WITH DIFFERENTIAL/PLATELET
BASOS PCT: 1 % (ref 0–1)
Basophils Absolute: 0 10*3/uL (ref 0.0–0.1)
Eosinophils Absolute: 0.1 10*3/uL (ref 0.0–0.7)
Eosinophils Relative: 2 % (ref 0–5)
HEMATOCRIT: 33.7 % — AB (ref 39.0–52.0)
Hemoglobin: 11.7 g/dL — ABNORMAL LOW (ref 13.0–17.0)
Lymphocytes Relative: 31 % (ref 12–46)
Lymphs Abs: 1.7 10*3/uL (ref 0.7–4.0)
MCH: 28.3 pg (ref 26.0–34.0)
MCHC: 34.7 g/dL (ref 30.0–36.0)
MCV: 81.4 fL (ref 78.0–100.0)
MONOS PCT: 12 % (ref 3–12)
Monocytes Absolute: 0.6 10*3/uL (ref 0.1–1.0)
NEUTROS ABS: 3 10*3/uL (ref 1.7–7.7)
NEUTROS PCT: 55 % (ref 43–77)
Platelets: 79 10*3/uL — ABNORMAL LOW (ref 150–400)
RBC: 4.14 MIL/uL — ABNORMAL LOW (ref 4.22–5.81)
RDW: 14.4 % (ref 11.5–15.5)
WBC: 5.4 10*3/uL (ref 4.0–10.5)

## 2013-09-03 LAB — PROTIME-INR
INR: 1.08 (ref 0.00–1.49)
PROTHROMBIN TIME: 13.8 s (ref 11.6–15.2)

## 2013-09-03 LAB — URINE MICROSCOPIC-ADD ON

## 2013-09-03 MED ORDER — SULFAMETHOXAZOLE-TMP DS 800-160 MG PO TABS: 1.0000 | ORAL_TABLET | Freq: Two times a day (BID) | ORAL | Status: DC

## 2013-09-03 MED ORDER — SULFAMETHOXAZOLE-TMP DS 800-160 MG PO TABS
1.0000 | ORAL_TABLET | Freq: Once | ORAL | Status: AC
  Administered 2013-09-03: 1 via ORAL
  Filled 2013-09-03: qty 1

## 2013-09-03 NOTE — Discharge Instructions (Signed)
As discussed, further evaluation of your hematuria is required (as an outpatient).  Please return here for concerning changes in your condition.    Hematuria, Adult Hematuria is blood in your urine. It can be caused by a bladder infection, kidney infection, prostate infection, kidney stone, or cancer of your urinary tract. Infections can usually be treated with medicine, and a kidney stone usually will pass through your urine. If neither of these is the cause of your hematuria, further workup to find out the reason may be needed. It is very important that you tell your health care provider about any blood you see in your urine, even if the blood stops without treatment or happens without causing pain. Blood in your urine that happens and then stops and then happens again can be a symptom of a very serious condition. Also, pain is not a symptom in the initial stages of many urinary cancers. HOME CARE INSTRUCTIONS   Drink lots of fluid, 3 4 quarts a day. If you have been diagnosed with an infection, cranberry juice is especially recommended, in addition to large amounts of water.  Avoid caffeine, tea, and carbonated beverages, because they tend to irritate the bladder.  Avoid alcohol because it may irritate the prostate.  Only take over-the-counter or prescription medicines for pain, discomfort, or fever as directed by your health care provider.  If you have been diagnosed with a kidney stone, follow your health care provider's instructions regarding straining your urine to catch the stone.  Empty your bladder often. Avoid holding urine for long periods of time.  After a bowel movement, women should cleanse front to back. Use each tissue only once.  Empty your bladder before and after sexual intercourse if you are a male. SEEK MEDICAL CARE IF: You develop back pain, fever, a feeling of sickness in your stomach (nausea), or vomiting or if your symptoms are not better in 3 days. Return  sooner if you are getting worse. SEEK IMMEDIATE MEDICAL CARE IF:   You have a persistent fever, with a temperature of 101.50F (38.8C) or greater.  You develop severe vomiting and are unable to keep the medicine down.  You develop severe back or abdominal pain despite taking your medicines.  You begin passing a large amount of blood or clots in your urine.  You feel extremely weak or faint, or you pass out. MAKE SURE YOU:   Understand these instructions.  Will watch your condition.  Will get help right away if you are not doing well or get worse. Document Released: 06/14/2005 Document Revised: 04/04/2013 Document Reviewed: 02/12/2013 [facility] Patient Information 2014 [place].

## 2013-09-03 NOTE — ED Notes (Signed)
MD at bedside. 

## 2013-09-03 NOTE — ED Notes (Signed)
Patient stated that he fell on ice a couple of weeks ago. No specific date

## 2013-09-03 NOTE — ED Notes (Addendum)
Pt states that approx starting Saturday he has blood in his urine. Pt denies pain or trouble voiding, or prostates problems. Pt denies taking blood thinners. Pt does states he has some discomfort in bilat flank area 2/10.

## 2013-09-03 NOTE — ED Notes (Signed)
Patient states no lower back pain on palpitation.

## 2013-09-03 NOTE — ED Provider Notes (Signed)
CSN: [id number]     Arrival date & time 09/03/13  1057 History   First MD Initiated Contact with Patient 09/03/13 1141     Chief Complaint  Patient presents with  . Hematuria     (Consider location/radiation/quality/duration/timing/severity/associated sxs/prior Treatment) Patient is a 79 y.o. male presenting with hematuria. The history is provided by the patient and a relative.  Hematuria This is a new problem. The current episode started 2 days ago. The problem occurs constantly. The problem has not changed since onset.Pertinent negatives include no chest pain, no abdominal pain, no headaches and no shortness of breath. Nothing aggravates the symptoms. Nothing relieves the symptoms. He has tried nothing for the symptoms.    Past Medical History  Diagnosis Date  . Thrombocytopenia   . Hyperlipidemia 08/30/2011  . Liver damage 1960's    Several liver bx due to elevated liver functions  . Hypertension   . GERD (gastroesophageal reflux disease)   . Anemia   . Iron deficiency anemia 05/26/2011  . Cellulitis 2013; 10/11/2012    "LLE; RLE" (10/11/2012)  . AAA (abdominal aortic aneurysm)   . Abdominal aneurysm without mention of rupture 04/23/2011  . Peripheral arterial disease 04/23/2011  . Pneumonia     "once a few years ago" (10/11/2012)  . History of blood transfusion 2013    "related to LLE hematoma" (10/11/2012)  . Syncope and collapse 10/11/2012  . Shortness of breath     none today (12/13/12)  . Hematoma 12-13-12    resolving bilaterally, right thigh remains"lump"  . Bowel trouble 12-13-12    "passing mucus only" seeing Dr. Sherian Maroon today  . COPD (chronic obstructive pulmonary disease) 05/06/2011  . Stage III chronic kidney disease     /notes 10/11/2012   Past Surgical History  Procedure Laterality Date  . Abdominal aortic aneurysm repair  2013  . Pr vein bypass graft,aorto-fem-pop    . Esophagogastroduodenoscopy  06/10/2011    Procedure: ESOPHAGOGASTRODUODENOSCOPY (EGD);   Surgeon: Jeryl Columbia, MD;  Location: [facility] ENDOSCOPY;  Service: Endoscopy;  Laterality: N/A;  . Colonoscopy with propofol  07/19/2012    Procedure: COLONOSCOPY WITH PROPOFOL;  Surgeon: Arta Silence, MD;  Location: [facility] ENDOSCOPY;  Service: Endoscopy;  Laterality: N/A;  polyp lifter needed  . Colonoscopy with propofol N/A 01/03/2013    Procedure: COLONOSCOPY WITH PROPOFOL;  Surgeon: Arta Silence, MD;  Location: [facility] ENDOSCOPY;  Service: Endoscopy;  Laterality: N/A;  . Flexible sigmoidoscopy N/A 01/07/2013    Procedure: FLEXIBLE SIGMOIDOSCOPY;  Surgeon: Missy Sabins, MD;  Location: [facility] ENDOSCOPY;  Service: Endoscopy;  Laterality: N/A;  . Colonoscopy N/A 01/08/2013    Procedure: COLONOSCOPY;  Surgeon: Cleotis Nipper, MD;  Location: [facility] ENDOSCOPY;  Service: Endoscopy;  Laterality: N/A;   Family History  Problem Relation Age of Onset  . Cancer Mother     breast  . Hypertension Father   . Diabetes Sister   . COPD Brother    History  Substance Use Topics  . Smoking status: Current Every Day Smoker -- 0.50 packs/day for 60 years    Types: Cigarettes  . Smokeless tobacco: Never Used  . Alcohol Use: No     Comment: 10/11/2012 "quit drinking > 10 yr ago"; Was a heavy drinker in his 20s-30s    Review of Systems  Constitutional:       Per HPI, otherwise negative  HENT:       Per HPI, otherwise negative  Respiratory: Negative for shortness of breath.  Per HPI, otherwise negative  Cardiovascular: Negative for chest pain.       Per HPI, otherwise negative  Gastrointestinal: Negative for vomiting and abdominal pain.  Endocrine: Negative for polyuria.  Genitourinary: Positive for hematuria. Negative for urgency, decreased urine volume, discharge, penile swelling, scrotal swelling, penile pain and testicular pain.  Musculoskeletal:       Per HPI, otherwise negative  Skin: Negative.   Neurological: Negative for syncope and headaches.      Allergies  Levofloxacin  Home Medications    Current Outpatient Rx  Name  Route  Sig  Dispense  Refill  . amLODipine (NORVASC) 5 MG tablet   Oral   Take 5 mg by mouth daily before breakfast.         . atorvastatin (LIPITOR) 10 MG tablet   Oral   Take 10 mg by mouth every morning.          BP 110/73  Pulse 85  Temp(Src) 97.8 F (36.6 C) (Oral)  SpO2 94% Physical Exam  Nursing note and vitals reviewed. Constitutional: He is oriented to person, place, and time. He appears well-developed. No distress.  HENT:  Head: Normocephalic and atraumatic.  Eyes: Conjunctivae and EOM are normal.  Cardiovascular: Normal rate and regular rhythm.   Pulmonary/Chest: Effort normal. No stridor. No respiratory distress.  Abdominal: He exhibits no distension and no mass. There is no tenderness. There is no rebound.  Musculoskeletal: He exhibits no edema.  Neurological: He is alert and oriented to person, place, and time.  Skin: Skin is warm and dry.  Psychiatric: He has a normal mood and affect.    ED Course  Procedures (including critical care time) Labs Review Labs Reviewed  URINALYSIS, ROUTINE W REFLEX MICROSCOPIC  CBC WITH DIFFERENTIAL  COMPREHENSIVE METABOLIC PANEL  PROTIME-INR    1:54 PM Patient smiling.  On repeat exam appears in no distress, with no new complaints.  Discussed all findings with him and his companion. Specifically we discussed the possibility of early medication between the patient's aortic repair and his ureters or bladder.   MDM   Final diagnoses:  Hematuria   Patient presents with ongoing painless hematuria.  Patient's presentation is most likely due to cystitis and/or UTI, though other pathology, including malignancy and fistula are considered. However, this seems unlikely given the absence of pain, distress, hemodynamically stability, but can be evaluated as an outpatient.  In addition, the patient has been informed of the necessity to followup with urology, possibly for cystoscopy, and  regardless, to ensure appropriate resolution of his hematuria    Carmin Muskrat, MD 09/03/13 1355

## 2013-09-06 ENCOUNTER — Ambulatory Visit (INDEPENDENT_AMBULATORY_CARE_PROVIDER_SITE_OTHER): Payer: Medicare Other | Admitting: Internal Medicine

## 2013-09-06 ENCOUNTER — Encounter: Payer: Self-pay | Admitting: Internal Medicine

## 2013-09-06 VITALS — BP 120/80 | HR 81 | Temp 97.2°F | Ht 71.0 in | Wt 127.5 lb

## 2013-09-06 DIAGNOSIS — N390 Urinary tract infection, site not specified: Secondary | ICD-10-CM

## 2013-09-06 DIAGNOSIS — I10 Essential (primary) hypertension: Secondary | ICD-10-CM

## 2013-09-06 DIAGNOSIS — Z23 Encounter for immunization: Secondary | ICD-10-CM

## 2013-09-06 DIAGNOSIS — J449 Chronic obstructive pulmonary disease, unspecified: Secondary | ICD-10-CM

## 2013-09-06 DIAGNOSIS — Z0000 Encounter for general adult medical examination without abnormal findings: Secondary | ICD-10-CM

## 2013-09-06 NOTE — Assessment & Plan Note (Signed)
stable overall by history and exam, recent data reviewed with pt, and pt to continue medical treatment as before,  to f/u any worsening symptoms or concerns SpO2 Readings from Last 3 Encounters:  09/06/13 99%  09/03/13 98%  04/04/13 100%

## 2013-09-06 NOTE — Progress Notes (Signed)
Subjective:    Patient ID: [name], male    DOB: 05/06/1935, 79 y.o.   MRN: [id number]  HPI  Here to f/u recent ER visit mar 9 with gross hematuria, tx for UTI with 7 day septra and overall improved.  Has much lessening hematuria today, no fever and Denies urinary symptoms such as dysuria, frequency, urgency, flank pain, or n/v, fever, chills.  Does have some lower lumbar back pain but seems to be improving as well.  This is at least 3rd episode UTI in last 18 mo, July 2014 found to have polymicrobial enterococcus and proteius. Has urology appt mar 19, no recent PSA.  No new complains. Pt denies chest pain, increased sob or doe, wheezing, orthopnea, PND, increased LE swelling, palpitations, dizziness or syncope.  Not currently taking antiplt or anticoagulant Past Medical History  Diagnosis Date  . Thrombocytopenia   . Hyperlipidemia 08/30/2011  . Liver damage 1960's    Several liver bx due to elevated liver functions  . Hypertension   . GERD (gastroesophageal reflux disease)   . Anemia   . Iron deficiency anemia 05/26/2011  . Cellulitis 2013; 10/11/2012    "LLE; RLE" (10/11/2012)  . AAA (abdominal aortic aneurysm)   . Abdominal aneurysm without mention of rupture 04/23/2011  . Peripheral arterial disease 04/23/2011  . Pneumonia     "once a few years ago" (10/11/2012)  . History of blood transfusion 2013    "related to LLE hematoma" (10/11/2012)  . Syncope and collapse 10/11/2012  . Shortness of breath     none today (12/13/12)  . Hematoma 12-13-12    resolving bilaterally, right thigh remains"lump"  . Bowel trouble 12-13-12    "passing mucus only" seeing Dr. Sherian Maroon today  . COPD (chronic obstructive pulmonary disease) 05/06/2011  . Stage III chronic kidney disease     /notes 10/11/2012   Past Surgical History  Procedure Laterality Date  . Abdominal aortic aneurysm repair  2013  . Pr vein bypass graft,aorto-fem-pop    . Esophagogastroduodenoscopy  06/10/2011    Procedure:  ESOPHAGOGASTRODUODENOSCOPY (EGD);  Surgeon: Jeryl Columbia, MD;  Location: [facility] ENDOSCOPY;  Service: Endoscopy;  Laterality: N/A;  . Colonoscopy with propofol  07/19/2012    Procedure: COLONOSCOPY WITH PROPOFOL;  Surgeon: Arta Silence, MD;  Location: [facility] ENDOSCOPY;  Service: Endoscopy;  Laterality: N/A;  polyp lifter needed  . Colonoscopy with propofol N/A 01/03/2013    Procedure: COLONOSCOPY WITH PROPOFOL;  Surgeon: Arta Silence, MD;  Location: [facility] ENDOSCOPY;  Service: Endoscopy;  Laterality: N/A;  . Flexible sigmoidoscopy N/A 01/07/2013    Procedure: FLEXIBLE SIGMOIDOSCOPY;  Surgeon: Missy Sabins, MD;  Location: [facility] ENDOSCOPY;  Service: Endoscopy;  Laterality: N/A;  . Colonoscopy N/A 01/08/2013    Procedure: COLONOSCOPY;  Surgeon: Cleotis Nipper, MD;  Location: [facility] ENDOSCOPY;  Service: Endoscopy;  Laterality: N/A;    reports that he has been smoking Cigarettes.  He has a 30 pack-year smoking history. He has never used smokeless tobacco. He reports that he does not drink alcohol or use illicit drugs. family history includes COPD in his brother; Cancer in his mother; Diabetes in his sister; Hypertension in his father. Allergies  Allergen Reactions  . Levofloxacin Other (See Comments)    HEMOLYTIC ANEMIA    Review of Systems  Constitutional: Negative for unexpected weight change, or unusual diaphoresis  HENT: Negative for tinnitus.   Eyes: Negative for photophobia and visual disturbance.  Respiratory: Negative for choking and stridor.   Gastrointestinal: Negative for  vomiting and blood in stool.  Genitourinary: Negative for hematuria and decreased urine volume.  Musculoskeletal: Negative for acute joint swelling Skin: Negative for color change and wound.  Neurological: Negative for tremors and numbness other than noted  Psychiatric/Behavioral: Negative for decreased concentration or  hyperactivity.       Objective:   Physical Exam BP 120/80  Pulse 81  Temp(Src) 97.2 F (36.2 C) (Oral)   Ht 5\' 11"  (1.803 m)  Wt 127 lb 8 oz (57.834 kg)  BMI 17.79 kg/m2  SpO2 99% VS noted,  Constitutional: Pt appears well-developed and well-nourished.  HENT: Head: NCAT.  Right Ear: External ear normal.  Left Ear: External ear normal.  Eyes: Conjunctivae and EOM are normal. Pupils are equal, round, and reactive to light.  Neck: Normal range of motion. Neck supple.  Cardiovascular: Normal rate and regular rhythm.   Pulmonary/Chest: Effort normal and breath sounds normal.  Abd:  Soft, NT, non-distended, + BS, no flank tender Spine nontender Neurological: Pt is alert. Not confused  Skin: Skin is warm. No erythema.  Psychiatric: Pt behavior is normal. Thought content normal.      Assessment & Plan:

## 2013-09-06 NOTE — Assessment & Plan Note (Signed)
Recurrent, to finish current tx as clinically improved, f/u urology as planned

## 2013-09-06 NOTE — Assessment & Plan Note (Signed)
stable overall by history and exam, recent data reviewed with pt, and pt to continue medical treatment as before,  to f/u any worsening symptoms or concerns BP Readings from Last 3 Encounters:  09/06/13 120/80  09/03/13 139/87  04/04/13 103/66

## 2013-09-06 NOTE — Addendum Note (Signed)
Addended by: Sharon Seller B on: 09/06/2013 01:52 PM   Modules accepted: Orders

## 2013-09-06 NOTE — Patient Instructions (Addendum)
You had the new Prevnar pneumonia shot today  You are overall improved from when you started the antibiotic  Please continue all other medications as before, and refills have been done if requested. Please have the pharmacy call with any other refills you may need.  Please keep your appointments with your specialists as you have planned - Urology for Mar 19  Please return in 6 months, or sooner if needed, with Lab testing done 3-5 days before

## 2013-09-06 NOTE — Progress Notes (Signed)
Pre visit review using our clinic review tool, if applicable. No additional management support is needed unless otherwise documented below in the visit note. 

## 2013-10-03 ENCOUNTER — Ambulatory Visit: Payer: Medicare Other

## 2013-10-03 ENCOUNTER — Other Ambulatory Visit: Payer: Medicare Other

## 2013-10-04 ENCOUNTER — Telehealth: Payer: Self-pay | Admitting: Internal Medicine

## 2013-10-04 NOTE — Telephone Encounter (Signed)
s/w pt re new appt for 5/14. appt r/s from 4/8 per pt.

## 2013-10-21 ENCOUNTER — Encounter (HOSPITAL_COMMUNITY): Payer: Self-pay | Admitting: Emergency Medicine

## 2013-10-21 ENCOUNTER — Inpatient Hospital Stay (HOSPITAL_COMMUNITY)
Admission: EM | Admit: 2013-10-21 | Discharge: 2013-10-26 | DRG: 064 | Disposition: E | Payer: Medicare Other | Attending: Neurology | Admitting: Neurology

## 2013-10-21 ENCOUNTER — Emergency Department (HOSPITAL_COMMUNITY): Payer: Medicare Other

## 2013-10-21 DIAGNOSIS — J9600 Acute respiratory failure, unspecified whether with hypoxia or hypercapnia: Secondary | ICD-10-CM | POA: Diagnosis present

## 2013-10-21 DIAGNOSIS — J4489 Other specified chronic obstructive pulmonary disease: Secondary | ICD-10-CM | POA: Diagnosis present

## 2013-10-21 DIAGNOSIS — E785 Hyperlipidemia, unspecified: Secondary | ICD-10-CM | POA: Diagnosis present

## 2013-10-21 DIAGNOSIS — N179 Acute kidney failure, unspecified: Secondary | ICD-10-CM | POA: Diagnosis present

## 2013-10-21 DIAGNOSIS — G936 Cerebral edema: Secondary | ICD-10-CM | POA: Diagnosis present

## 2013-10-21 DIAGNOSIS — E0590 Thyrotoxicosis, unspecified without thyrotoxic crisis or storm: Secondary | ICD-10-CM | POA: Diagnosis present

## 2013-10-21 DIAGNOSIS — D696 Thrombocytopenia, unspecified: Secondary | ICD-10-CM | POA: Diagnosis present

## 2013-10-21 DIAGNOSIS — N183 Chronic kidney disease, stage 3 unspecified: Secondary | ICD-10-CM | POA: Diagnosis present

## 2013-10-21 DIAGNOSIS — I129 Hypertensive chronic kidney disease with stage 1 through stage 4 chronic kidney disease, or unspecified chronic kidney disease: Secondary | ICD-10-CM | POA: Diagnosis present

## 2013-10-21 DIAGNOSIS — I739 Peripheral vascular disease, unspecified: Secondary | ICD-10-CM | POA: Diagnosis present

## 2013-10-21 DIAGNOSIS — Z515 Encounter for palliative care: Secondary | ICD-10-CM

## 2013-10-21 DIAGNOSIS — D591 Autoimmune hemolytic anemia, unspecified: Secondary | ICD-10-CM | POA: Diagnosis present

## 2013-10-21 DIAGNOSIS — I619 Nontraumatic intracerebral hemorrhage, unspecified: Principal | ICD-10-CM | POA: Diagnosis present

## 2013-10-21 DIAGNOSIS — I6350 Cerebral infarction due to unspecified occlusion or stenosis of unspecified cerebral artery: Secondary | ICD-10-CM

## 2013-10-21 DIAGNOSIS — J449 Chronic obstructive pulmonary disease, unspecified: Secondary | ICD-10-CM

## 2013-10-21 DIAGNOSIS — K219 Gastro-esophageal reflux disease without esophagitis: Secondary | ICD-10-CM | POA: Diagnosis present

## 2013-10-21 DIAGNOSIS — Z66 Do not resuscitate: Secondary | ICD-10-CM | POA: Diagnosis not present

## 2013-10-21 DIAGNOSIS — G9340 Encephalopathy, unspecified: Secondary | ICD-10-CM | POA: Diagnosis present

## 2013-10-21 DIAGNOSIS — G8190 Hemiplegia, unspecified affecting unspecified side: Secondary | ICD-10-CM | POA: Diagnosis present

## 2013-10-21 DIAGNOSIS — Z8249 Family history of ischemic heart disease and other diseases of the circulatory system: Secondary | ICD-10-CM

## 2013-10-21 DIAGNOSIS — F17200 Nicotine dependence, unspecified, uncomplicated: Secondary | ICD-10-CM | POA: Diagnosis present

## 2013-10-21 LAB — DIFFERENTIAL
BASOS ABS: 0 10*3/uL (ref 0.0–0.1)
Basophils Relative: 0 % (ref 0–1)
EOS PCT: 1 % (ref 0–5)
Eosinophils Absolute: 0.1 10*3/uL (ref 0.0–0.7)
LYMPHS ABS: 2 10*3/uL (ref 0.7–4.0)
LYMPHS PCT: 21 % (ref 12–46)
Monocytes Absolute: 1.3 10*3/uL — ABNORMAL HIGH (ref 0.1–1.0)
Monocytes Relative: 14 % — ABNORMAL HIGH (ref 3–12)
NEUTROS ABS: 6.1 10*3/uL (ref 1.7–7.7)
NEUTROS PCT: 64 % (ref 43–77)

## 2013-10-21 LAB — COMPREHENSIVE METABOLIC PANEL
ALK PHOS: 82 U/L (ref 39–117)
ALT: 6 U/L (ref 0–53)
AST: 20 U/L (ref 0–37)
Albumin: 3.7 g/dL (ref 3.5–5.2)
BUN: 26 mg/dL — ABNORMAL HIGH (ref 6–23)
CO2: 19 meq/L (ref 19–32)
Calcium: 9.4 mg/dL (ref 8.4–10.5)
Chloride: 108 mEq/L (ref 96–112)
Creatinine, Ser: 2.76 mg/dL — ABNORMAL HIGH (ref 0.50–1.35)
GFR calc Af Amer: 24 mL/min — ABNORMAL LOW (ref >90)
GFR, EST NON AFRICAN AMERICAN: 21 mL/min — AB (ref >90)
Glucose, Bld: 127 mg/dL — ABNORMAL HIGH (ref 70–99)
POTASSIUM: 4.2 meq/L (ref 3.7–5.3)
SODIUM: 144 meq/L (ref 137–147)
TOTAL PROTEIN: 8.2 g/dL (ref 6.0–8.3)
Total Bilirubin: 0.5 mg/dL (ref 0.3–1.2)

## 2013-10-21 LAB — CBC
HCT: 29.4 % — ABNORMAL LOW (ref 39.0–52.0)
Hemoglobin: 10.2 g/dL — ABNORMAL LOW (ref 13.0–17.0)
MCH: 28.3 pg (ref 26.0–34.0)
MCHC: 34.7 g/dL (ref 30.0–36.0)
MCV: 81.7 fL (ref 78.0–100.0)
PLATELETS: 111 10*3/uL — AB (ref 150–400)
RBC: 3.6 MIL/uL — AB (ref 4.22–5.81)
RDW: 14.3 % (ref 11.5–15.5)
WBC: 9.5 10*3/uL (ref 4.0–10.5)

## 2013-10-21 LAB — I-STAT CHEM 8, ED
BUN: 25 mg/dL — AB (ref 6–23)
CHLORIDE: 115 meq/L — AB (ref 96–112)
CREATININE: 2.9 mg/dL — AB (ref 0.50–1.35)
Calcium, Ion: 1.2 mmol/L (ref 1.13–1.30)
GLUCOSE: 128 mg/dL — AB (ref 70–99)
HCT: 31 % — ABNORMAL LOW (ref 39.0–52.0)
Hemoglobin: 10.5 g/dL — ABNORMAL LOW (ref 13.0–17.0)
POTASSIUM: 3.8 meq/L (ref 3.7–5.3)
SODIUM: 141 meq/L (ref 137–147)
TCO2: 21 mmol/L (ref 0–100)

## 2013-10-21 LAB — MRSA PCR SCREENING: MRSA BY PCR: NEGATIVE

## 2013-10-21 LAB — ETHANOL: Alcohol, Ethyl (B): <11 mg/dL (ref 0–11)

## 2013-10-21 LAB — PROTIME-INR
INR: 1.16 (ref 0.00–1.49)
Prothrombin Time: 14.6 seconds (ref 11.6–15.2)

## 2013-10-21 LAB — CBG MONITORING, ED: Glucose-Capillary: 113 mg/dL — ABNORMAL HIGH (ref 70–99)

## 2013-10-21 LAB — APTT: APTT: 34 s (ref 24–37)

## 2013-10-21 LAB — I-STAT TROPONIN, ED: Troponin i, poc: 0.01 ng/mL (ref 0.00–0.08)

## 2013-10-21 MED ORDER — SODIUM CHLORIDE 0.9 % IV SOLN
INTRAVENOUS | Status: DC
  Administered 2013-10-21: 10:00:00 via INTRAVENOUS

## 2013-10-21 MED ORDER — SENNOSIDES-DOCUSATE SODIUM 8.6-50 MG PO TABS
1.0000 | ORAL_TABLET | Freq: Two times a day (BID) | ORAL | Status: DC
  Filled 2013-10-21: qty 1

## 2013-10-21 MED ORDER — ACETAMINOPHEN 650 MG RE SUPP: 650.0000 mg | RECTAL | Status: DC | PRN

## 2013-10-21 MED ORDER — LABETALOL HCL 5 MG/ML IV SOLN
10.0000 mg | INTRAVENOUS | Status: DC | PRN
  Administered 2013-10-21: 20 mg via INTRAVENOUS
  Administered 2013-10-21: 10 mg via INTRAVENOUS
  Administered 2013-10-21: 20 mg via INTRAVENOUS
  Filled 2013-10-21 (×3): qty 4

## 2013-10-21 MED ORDER — PANTOPRAZOLE SODIUM 40 MG IV SOLR: 40.0000 mg | Freq: Every day | INTRAVENOUS | Status: DC

## 2013-10-21 MED ORDER — ACETAMINOPHEN 325 MG PO TABS: 650.0000 mg | ORAL_TABLET | ORAL | Status: DC | PRN

## 2013-10-21 MED ORDER — NICARDIPINE HCL IN NACL 20-0.86 MG/200ML-% IV SOLN
3.0000 mg/h | INTRAVENOUS | Status: DC
  Administered 2013-10-21: 5 mg/h via INTRAVENOUS
  Filled 2013-10-21: qty 200

## 2013-10-22 NOTE — Progress Notes (Signed)
Patient admitted due to [place].  Contacted at [place] that pupils were fixed and dilated.  Patient DNR.  Family has decided on comfort care.  Asystolic at 8882 became asystolic.  Pronounced at 1940 due to [place] felt to be secondary to hypertension.    Alexis Goodell, MD [facility] [phone]

## 2013-10-24 NOTE — Discharge Summary (Signed)
Patient ID: [name] MRN: [id number] DOB/AGE: Feb 07, 1935 79 y.o.  Admit date: 2013-11-13 Death date: 11-14-13 at 4 14 am  Admission Diagnoses: Code stroke  Cause of Death:  Respiratory failure secondary to  increased intracranial pressure from large left subinsular cortex and subcortical intracerebral hemorrhage likely due to hypertension. Patient made DO NOT RESUSCITATE and comfort care by family  Pertinent Medical Diagnosis: Active Problems:   Stroke due to intracerebral hemorrhage   Acute respiratory failure   Hospital Course:   [name] is an 79 y.o. male with a past medical history significant for hyperlipidemia, HTN, thrombocytopenia with recent hematuria, s/p AAA repair 2014, PAD, stage 3 chronic kidney disease, COPD, brought in by ambulance due to the above stated symptoms.  He was home and around 8 am became less responsive, weak in the right side, developed slurred speech, and confusion and thus EMS was summoned. According to EMS personnel he had a SBP 253 and was certainly not fully responsive with left gaze preference, dysarthria, right face weakness, and right hemiparesis.  Initial NIHSS 22 upon arrival to the ED.  CT brain revealed an acute left sub insular intraparenchymal hemorrhage that measures 5.3 x 2.8 cm in greatest  dimension, with surrounding vasogenic edema and local mass effect. No midline shift.  INR 1.16, PTT 34, platelet count 111   Date last known well: 2013/11/13  Time last known well: 8 am  tPA Given: no, ICH  NIHSS: 22  MRS: 4 Patient was initially awake and following commands  with left gaze deviation and dense right hemiplegia upon admission to the intensive care unit. Blood pressure was kept at a controlled. Critical care was consulted to maintain airway. The family however after discussion realized patient's poor prognosis and made him DO NOT RESUSCITATE and did not want intubation. Patient's condition declined and he was found to have fixed  nonreactive pupils and was found to be pulseless and declared dead by the RN on 2013/11/14 at 4:14 AM CT HEAD WITHOUT CONTRAST TECHNIQUE: Contiguous axial images were obtained from the base of the skull through the vertex without intravenous contrast. COMPARISON: Head CT 10/11/2012 FINDINGS: Interval development of intraparenchymal hemorrhage in the left subinsular cortex with local mass effect compressing the adjacent basal ganglia. The hemorrhage measures 5.3 x 2.8 cm in greatest dimension. There is effacement of the left lateral ventricle but no midline shift. Mild global cerebral atrophy. Periventricular, subcortical and deep white matter hypoattenuation consistent with longstanding microvascular white matter disease. Symmetric bilateral leftward gaze in the orbits. No acute soft tissue or calvarial abnormality. Small 1.2 cm circumscribed low-attenuation nodule in the soft tissues of the antral lateral left cheek at the inferior margin of the zygomatic arch. There is associated surrounding vasogenic edema. IMPRESSION: 1. Acute left sub insular intraparenchymal hemorrhage with surrounding vasogenic edema and local mass effect. No midline shift. 2. Background of atrophy and mild chronic microvascular ischemic white matter disease. 3. Small 1.2 cm dermoid or epidermoid cyst in the superficial soft tissues of the left antrerolateral cheek at the inferior aspect of the zygomatic arch  Signed: Garvin Fila 10/24/2013, 1:24 PM

## 2013-10-25 ENCOUNTER — Telehealth: Payer: Self-pay | Admitting: Neurology

## 2013-10-25 NOTE — Telephone Encounter (Signed)
Levada Dy calling from [place] calling to check whether Dr. Leonie Man has signed patient's death certificate. Please return call and advise.

## 2013-10-26 NOTE — Consult Note (Signed)
PULMONARY / CRITICAL CARE MEDICINE   Name: [name] MRN: [id number] DOB: February 08, 1935    ADMISSION DATE:  10/25/2013 CONSULTATION DATE:  10/09/2013  REFERRING MD :  Dr. Leonie Holmes PRIMARY SERVICE: Neuro  CHIEF COMPLAINT:  Concern for inability to protect airway in setting of [name]  BRIEF PATIENT DESCRIPTION: [name] is a 79 year old male with a PMH of HTN, HLD, PAD, CKD3, COPD, s/p AAA repair (2014) and thrombocytopenia with recent hematuria.  He presented to the ED via EMS and was admitted with left intracerebral hemorrhage.   SIGNIFICANT EVENTS / STUDIES:  04/26 CT head w/o contrast - acute left sub-insular intraparenchymal hemorrhage with surrounding vasogenic edema and local mass effect.  No midline shift.  LINES / TUBES: None   CULTURES: None   ANTIBIOTICS: None    PAST MEDICAL HISTORY :  Past Medical History  Diagnosis Date  . Thrombocytopenia   . Hyperlipidemia 08/30/2011  . Liver damage 1960's    Several liver bx due to elevated liver functions  . Hypertension   . GERD (gastroesophageal reflux disease)   . Anemia   . Iron deficiency anemia 05/26/2011  . Cellulitis 2013; 10/11/2012    "LLE; RLE" (10/11/2012)  . AAA (abdominal aortic aneurysm)   . Abdominal aneurysm without mention of rupture 04/23/2011  . Peripheral arterial disease 04/23/2011  . Pneumonia     "once a few years ago" (10/11/2012)  . History of blood transfusion 2013    "related to LLE hematoma" (10/11/2012)  . Syncope and collapse 10/11/2012  . Shortness of breath     none today (12/13/12)  . Hematoma 12-13-12    resolving bilaterally, right thigh remains"lump"  . Bowel trouble 12-13-12    "passing mucus only" seeing Dr. Sherian Maroon today  . COPD (chronic obstructive pulmonary disease) 05/06/2011  . Stage III chronic kidney disease     /notes 10/11/2012   Past Surgical History  Procedure Laterality Date  . Abdominal aortic aneurysm repair  2013  . Pr vein bypass graft,aorto-fem-pop    .  Esophagogastroduodenoscopy  06/10/2011    Procedure: ESOPHAGOGASTRODUODENOSCOPY (EGD);  Surgeon: Jeryl Columbia, MD;  Location: [facility] ENDOSCOPY;  Service: Endoscopy;  Laterality: N/A;  . Colonoscopy with propofol  07/19/2012    Procedure: COLONOSCOPY WITH PROPOFOL;  Surgeon: Arta Silence, MD;  Location: [facility] ENDOSCOPY;  Service: Endoscopy;  Laterality: N/A;  polyp lifter needed  . Colonoscopy with propofol N/A 01/03/2013    Procedure: COLONOSCOPY WITH PROPOFOL;  Surgeon: Arta Silence, MD;  Location: [facility] ENDOSCOPY;  Service: Endoscopy;  Laterality: N/A;  . Flexible sigmoidoscopy N/A 01/07/2013    Procedure: FLEXIBLE SIGMOIDOSCOPY;  Surgeon: Missy Sabins, MD;  Location: [facility] ENDOSCOPY;  Service: Endoscopy;  Laterality: N/A;  . Colonoscopy N/A 01/08/2013    Procedure: COLONOSCOPY;  Surgeon: Cleotis Nipper, MD;  Location: [facility] ENDOSCOPY;  Service: Endoscopy;  Laterality: N/A;   Prior to Admission medications   Medication Sig Start Date End Date Taking? Authorizing Provider  amLODipine (NORVASC) 5 MG tablet Take 5 mg by mouth daily before breakfast.    Historical Provider, MD  atorvastatin (LIPITOR) 10 MG tablet Take 10 mg by mouth every morning.    Historical Provider, MD   Allergies  Allergen Reactions  . Levofloxacin Other (See Comments)    HEMOLYTIC ANEMIA    FAMILY HISTORY:  Family History  Problem Relation Age of Onset  . Cancer Mother     breast  . Hypertension Father   . Diabetes Sister   .  COPD Brother    SOCIAL HISTORY:  reports that he has been smoking Cigarettes.  He has a 30 pack-year smoking history. He has never used smokeless tobacco. He reports that he does not drink alcohol or use illicit drugs.  REVIEW OF SYSTEMS:  Unable to obtain 2/2 to patient's altered mental status.   SUBJECTIVE: Unable to obtain 2/2 to patient's altered mental status.   VITAL SIGNS: Temp:  [97.4 F (36.3 C)] 97.4 F (36.3 C) (04/26 1045) Pulse Rate:  [30-111] 95 (04/26 1045) Resp:  [12-24] 24  (04/26 1045) BP: (152-206)/(73-125) 206/125 mmHg (04/26 1045) SpO2:  [92 %-100 %] 98 % (04/26 1045) Weight:  [52.8 kg (116 lb 6.5 oz)] 52.8 kg (116 lb 6.5 oz) (04/26 1045)  HEMODYNAMICS:   VENTILATOR SETTINGS:  N/A   INTAKE / OUTPUT: Intake/Output     04/25 0701 - 04/26 0700 04/26 0701 - 04/27 0700   I.V. (mL/kg)  10 (0.2)   Total Intake(mL/kg)  10 (0.2)   Net   +10         PHYSICAL EXAMINATION: General:  Elderly, thin male Neuro:  No pupillary response, some non-purposeful movement of left arm, unresponsive, limited neuro exam 2/2 to AMS HEENT:  Mouth open Cardiovascular:  RRR Lungs:  CTA B/L Abdomen:  Soft, ND Musculoskeletal:  Unable to assess strength 2/2 to AMS Skin:  Intact   LABS:  CBC  Recent Labs Lab 09/29/2013 0845 10/12/2013 0858  WBC 9.5  --   HGB 10.2* 10.5*  HCT 29.4* 31.0*  PLT 111*  --    Coag's  Recent Labs Lab 10/12/2013 0845  APTT 34  INR 1.16   BMET  Recent Labs Lab 10/16/2013 0845 10/14/2013 0858  NA 144 141  K 4.2 3.8  CL 108 115*  CO2 19  --   BUN 26* 25*  CREATININE 2.76* 2.90*  GLUCOSE 127* 128*   Electrolytes  Recent Labs Lab 10/20/2013 0845  CALCIUM 9.4   Liver Enzymes  Recent Labs Lab 10/03/2013 0845  AST 20  ALT 6  ALKPHOS 82  BILITOT 0.5  ALBUMIN 3.7   Glucose  Recent Labs Lab 10/08/2013 0904  GLUCAP 113*    Imaging Ct Head Wo Contrast  09/29/2013   CLINICAL DATA:  Code stroke:  Right sided symptoms  EXAM: CT HEAD WITHOUT CONTRAST  TECHNIQUE: Contiguous axial images were obtained from the base of the skull through the vertex without intravenous contrast.  COMPARISON:  Head CT 10/11/2012  FINDINGS: Interval development of intraparenchymal hemorrhage in the left subinsular cortex with local mass effect compressing the adjacent basal ganglia. The hemorrhage measures 5.3 x 2.8 cm in greatest dimension. There is effacement of the left lateral ventricle but no midline shift. Mild global cerebral atrophy.  Periventricular, subcortical and deep white matter hypoattenuation consistent with longstanding microvascular white matter disease. Symmetric bilateral leftward gaze in the orbits. No acute soft tissue or calvarial abnormality. Small 1.2 cm circumscribed low-attenuation nodule in the soft tissues of the antral lateral left cheek at the inferior margin of the zygomatic arch. There is associated surrounding vasogenic edema.  IMPRESSION: 1. Acute left sub insular intraparenchymal hemorrhage with surrounding vasogenic edema and local mass effect. No midline shift. 2. Background of atrophy and mild chronic microvascular ischemic white matter disease. 3. Small 1.2 cm dermoid or epidermoid cyst in the superficial soft tissues of the left antrerolateral cheek at the inferior aspect of the zygomatic arch. Critical Value/emergent results were called by telephone at the time  of interpretation on 11-20-13 at 9:09 AM to Dr. Veryl Speak , who verbally acknowledged these results.   Electronically Signed   By: Jacqulynn Cadet M.D.   On: Nov 20, 2013 09:07    ASSESSMENT / PLAN:  PULMONARY A: No acute abnormality, but concern that he may become unable to protect his airway given his ICH.  Spoke with family regarding poor prognosis and they have decided to make him DNR/DNI. P:   - continuous monitoring of oxygen saturation - oxygen supplementation - DNR/DNI   CARDIOVASCULAR A: Malignant hypertension; trop neg P:  - telemetry  - plan per primary: Cardene gtt, labetalol prn   RENAL A:  AKI on CKD3 - baseline ~ 1.5; 2.76 on admission P:   - managing HTN as above - monitor BMP   GASTROINTESTINAL A:  No acute abnormality P:   - VTE ppx - Protonix  - NPO   HEMATOLOGIC A:  Autoimmune hemolytic anemia - followed by HemeOnc      Thrombocytopenia, chronic - baseline 60-70s; 111 at admission       Intraparenchymal hemorrhage - likely 2/2 to HTN; coags normal; see Neuro below P:   - monitor CBC     INFECTIOUS A:  No evidence of infection; UA pending P:   - monitor CBC, temperature   ENDOCRINE A:  Hx of hyperthyroidism - no anti-thyroid on his med list; may be difficult to assess thyroid function in the setting of acute illness. P:   - plan per primary   NEUROLOGIC A:  Acute encephalopathy 2/2 to left ICH.  Discussed poor prognosis with family, including the patient's wife, sister and daughter.  They do not want the patient to suffer.  They agree to DNR/DNI status with continued medical therapy.       Left sub-insular intraparenchymal hemorrhage with surrounding vasogenic edema and local mass effect.  No midline shift.  P:   - Will not intubate as patient is now DNR/DNI - Plan per primary - Continue BP control - PT/OT/ SLP evaluations   Duwaine Maxin, DO IMTS, PGY1   TODAY'S SUMMARY: 79 year old with extending ICH, respiratory failure, is deteriorating as we speak.  Spoke with family, made full DNR, if deteriorates further then will transition to comfort care.  PCCM will sign off, please call back if needed.  I have personally obtained a history, examined the patient, evaluated laboratory and imaging results, formulated the assessment and plan and placed orders.  CRITICAL CARE: The patient is critically ill with multiple organ systems failure and requires high complexity decision making for assessment and support, frequent evaluation and titration of therapies, application of advanced monitoring technologies and extensive interpretation of multiple databases. Critical Care Time devoted to patient care services described in this note is 45 minutes.   Rush Farmer, M.D. [facility] Pulmonary/Critical Care Medicine. Pager: [phone]. After hours pager: [phone].

## 2013-10-26 NOTE — ED Notes (Signed)
Pt wife given all belongings to take home.

## 2013-10-26 NOTE — Progress Notes (Signed)
Chaplain responded to page for family support during code stroke. Met with patient's wife, [name], and sister-in-law, [name] in [place]. Provided emotional support through pastoral presence and empathic listening. [name] is aware of chaplain services and availability. Please page for follow up.   Ethelene Browns [phone]

## 2013-10-26 NOTE — ED Notes (Signed)
Pt from home via [facility] after pt wife found pt confused, right sided weakness, facial droop, with left gaze around 815 am.  Last seen normal was 600 am.  Pt got up, ate breakfast, and got dressed normally this morning.  Hx of thrombocytopenia. Pt in NAD.

## 2013-10-26 NOTE — ED Provider Notes (Signed)
CSN: [id number]     Arrival date & time 10/20/2013  0841 History   First MD Initiated Contact with Patient 10/01/2013 [phone]     No chief complaint on file.    (Consider location/radiation/quality/duration/timing/severity/associated sxs/prior Treatment) HPI Comments: Patient is a 79 year old male with extensive past medical history. He was brought to the ER by EMS for evaluation of possible stroke. His last seen normal at about 6 AM when he laid back down to rest. Approximately 2 hours later his wife went to check on him and he was not responding appropriately. He appeared very weak and his speech was slurred and somewhat incoherent. She called 911 and the patient was transported here as a possible code stroke.  The history is provided by the patient.    Past Medical History  Diagnosis Date  . Thrombocytopenia   . Hyperlipidemia 08/30/2011  . Liver damage 1960's    Several liver bx due to elevated liver functions  . Hypertension   . GERD (gastroesophageal reflux disease)   . Anemia   . Iron deficiency anemia 05/26/2011  . Cellulitis 2013; 10/11/2012    "LLE; RLE" (10/11/2012)  . AAA (abdominal aortic aneurysm)   . Abdominal aneurysm without mention of rupture 04/23/2011  . Peripheral arterial disease 04/23/2011  . Pneumonia     "once a few years ago" (10/11/2012)  . History of blood transfusion 2013    "related to LLE hematoma" (10/11/2012)  . Syncope and collapse 10/11/2012  . Shortness of breath     none today (12/13/12)  . Hematoma 12-13-12    resolving bilaterally, right thigh remains"lump"  . Bowel trouble 12-13-12    "passing mucus only" seeing Dr. Sherian Maroon today  . COPD (chronic obstructive pulmonary disease) 05/06/2011  . Stage III chronic kidney disease     /notes 10/11/2012   Past Surgical History  Procedure Laterality Date  . Abdominal aortic aneurysm repair  2013  . Pr vein bypass graft,aorto-fem-pop    . Esophagogastroduodenoscopy  06/10/2011    Procedure:  ESOPHAGOGASTRODUODENOSCOPY (EGD);  Surgeon: Jeryl Columbia, MD;  Location: [facility] ENDOSCOPY;  Service: Endoscopy;  Laterality: N/A;  . Colonoscopy with propofol  07/19/2012    Procedure: COLONOSCOPY WITH PROPOFOL;  Surgeon: Arta Silence, MD;  Location: [facility] ENDOSCOPY;  Service: Endoscopy;  Laterality: N/A;  polyp lifter needed  . Colonoscopy with propofol N/A 01/03/2013    Procedure: COLONOSCOPY WITH PROPOFOL;  Surgeon: Arta Silence, MD;  Location: [facility] ENDOSCOPY;  Service: Endoscopy;  Laterality: N/A;  . Flexible sigmoidoscopy N/A 01/07/2013    Procedure: FLEXIBLE SIGMOIDOSCOPY;  Surgeon: Missy Sabins, MD;  Location: [facility] ENDOSCOPY;  Service: Endoscopy;  Laterality: N/A;  . Colonoscopy N/A 01/08/2013    Procedure: COLONOSCOPY;  Surgeon: Cleotis Nipper, MD;  Location: [facility] ENDOSCOPY;  Service: Endoscopy;  Laterality: N/A;   Family History  Problem Relation Age of Onset  . Cancer Mother     breast  . Hypertension Father   . Diabetes Sister   . COPD Brother    History  Substance Use Topics  . Smoking status: Current Every Day Smoker -- 0.50 packs/day for 60 years    Types: Cigarettes  . Smokeless tobacco: Never Used  . Alcohol Use: No     Comment: 10/11/2012 "quit drinking > 10 yr ago"; Was a heavy drinker in his 20s-30s    Review of Systems  Unable to perform ROS     Allergies  Levofloxacin  Home Medications   Prior to Admission medications  Medication Sig Start Date End Date Taking? Authorizing Provider  amLODipine (NORVASC) 5 MG tablet Take 5 mg by mouth daily before breakfast.    Historical Provider, MD  atorvastatin (LIPITOR) 10 MG tablet Take 10 mg by mouth every morning.    Historical Provider, MD  sulfamethoxazole-trimethoprim (BACTRIM DS) 800-160 MG per tablet Take 1 tablet by mouth 2 (two) times daily. 09/03/13   Carmin Muskrat, MD   There were no vitals taken for this visit. Physical Exam  Nursing note and vitals reviewed. Constitutional: He is oriented to person, place, and  time.  Patient is an elderly male who appears acutely ill. He is awake and alert.  HENT:  Head: Normocephalic and atraumatic.  Neck: Normal range of motion. Neck supple.  Cardiovascular: Normal rate, regular rhythm and normal heart sounds.   No murmur heard. Pulmonary/Chest: Effort normal and breath sounds normal. No respiratory distress. He has no wheezes.  Abdominal: Soft. Bowel sounds are normal. He exhibits no distension. There is no tenderness.  Musculoskeletal: Normal range of motion. He exhibits no edema.  Neurological: He is alert and oriented to person, place, and time. He exhibits abnormal muscle tone. Coordination abnormal.  There appears to be a right-sided hemiparesis with right facial droop. He is able to respond to commands, however somewhat slowly.  Skin: Skin is warm and dry.    ED Course  Procedures (including critical care time) Labs Review Labs Reviewed  ETHANOL  PROTIME-INR  APTT  CBC  DIFFERENTIAL  COMPREHENSIVE METABOLIC PANEL  URINE RAPID DRUG SCREEN (HOSP PERFORMED)  URINALYSIS, ROUTINE W REFLEX MICROSCOPIC  I-STAT CHEM 8, ED  I-STAT TROPOININ, ED  I-STAT TROPOININ, ED    Imaging Review No results found.   EKG Interpretation   Date/Time:  Sunday October 21 2013 09:05:18 EDT Ventricular Rate:  96 PR Interval:  141 QRS Duration: 83 QT Interval:  359 QTC Calculation: 454 R Axis:   24 Text Interpretation:  Sinus rhythm Atrial premature complex Nonspecific T  abnrm, anterolateral leads Confirmed by Beau Fanny  MD, Ananiah Maciolek ([id number]) on  10/23/2013 9:07:57 AM      MDM   Final diagnoses:  None    Patient is a 79 year old male brought by EMS after being found confused and weak on the right side. He arrives here as a code stroke. He was sent directly to radiology for a CT scan of the head. This unfortunately revealed a left-sided intraparenchymal hemorrhage. He is currently being evaluated by neurology and will be admitted to their service. His blood  pressure appears appropriate and he is protecting his airway. Laboratory studies reveal mild anemia and platelet count is pending. He was thrombocytopenic on a recent blood draw.  CRITICAL CARE Performed by: Veryl Speak Total critical care time: 30 minutes Critical care time was exclusive of separately billable procedures and treating other patients. Critical care was necessary to treat or prevent imminent or life-threatening deterioration. Critical care was time spent personally by me on the following activities: development of treatment plan with patient and/or surrogate as well as nursing, discussions with consultants, evaluation of patient's response to treatment, examination of patient, obtaining history from patient or surrogate, ordering and performing treatments and interventions, ordering and review of laboratory studies, ordering and review of radiographic studies, pulse oximetry and re-evaluation of patient's condition.     Veryl Speak, MD 10/02/2013 [phone]

## 2013-10-26 NOTE — Progress Notes (Signed)
At shift change patient was noted to have dilated, non-reactive pupils. This assessment was a change from the previous hours assessment of pupil size 2 and sluggish. Dr. Doy Mince was notified of change, patient currently remains a DNR. Will continue to monitor.   Terri Piedra, RN

## 2013-10-26 NOTE — ED Notes (Signed)
Family removed pt's ring, necklace and any other jewelry.

## 2013-10-26 NOTE — Plan of Care (Signed)
Problem: Acute Treatment Outcomes Goal: Airway maintained/protected Outcome: Not Met (add Reason) 4/26 - patient made DNR by family

## 2013-10-26 NOTE — Code Documentation (Signed)
Code stroke called at [phone], Patient arrived to [facility] ED via EMS at [phone].  As per family, Patient LSn at 0600 when patient woke up and around [place], family noticed a facial droop, right side weakness and gaze.  NIHSS 22, CT scan + bleed.  Patient will transfer to 3 M.

## 2013-10-26 NOTE — Consult Note (Signed)
Referring Physician: ED    Chief Complaint: CODE STROKE: RIGHT HEMIPARESIS, RIGHT FACE WEAKNESS, DYSARTHRIA, DECREASED RESPONSIVENESS.  HPI:                                                                                                                                         [name] is an 79 y.o. male with a past medical history significant for hyperlipidemia, HTN, thrombocytopenia with recent hematuria, s/p AAA repair 2014, PAD, stage 3 chronic kidney disease, COPD, brought in by ambulance due to the above stated symptoms. He was home and around 8 am became less responsive, weak in the right side, developed slurred speech, and confusion and thus EMS was summoned. According to EMS personnel he had a SBP 0000000 and was certainly not fully responsive with left gaze preference, dysarthria, right face weakness, and right hemiparesis. Initial NIHSS 22 upon arrival to the ED. CT brain revealed an acute left sub insular intraparenchymal hemorrhage that measures 5.3 x 2.8 cm in greatest  dimension, with surrounding vasogenic edema and local mass effect. No midline shift. INR 1.16, PTT 34, platelet count 111 At this moment he is alert and answering questions. Date last known well: Nov 19, 2013 Time last known well: 8 am tPA Given: no, ICH NIHSS: 22 MRS: 4  Past Medical History  Diagnosis Date  . Thrombocytopenia   . Hyperlipidemia 08/30/2011  . Liver damage 1960's    Several liver bx due to elevated liver functions  . Hypertension   . GERD (gastroesophageal reflux disease)   . Anemia   . Iron deficiency anemia 05/26/2011  . Cellulitis 2013; 10/11/2012    "LLE; RLE" (10/11/2012)  . AAA (abdominal aortic aneurysm)   . Abdominal aneurysm without mention of rupture 04/23/2011  . Peripheral arterial disease 04/23/2011  . Pneumonia     "once a few years ago" (10/11/2012)  . History of blood transfusion 2013    "related to LLE hematoma" (10/11/2012)  . Syncope and collapse 10/11/2012  . Shortness of  breath     none today (12/13/12)  . Hematoma 12-13-12    resolving bilaterally, right thigh remains"lump"  . Bowel trouble 12-13-12    "passing mucus only" seeing Dr. Sherian Maroon today  . COPD (chronic obstructive pulmonary disease) 05/06/2011  . Stage III chronic kidney disease     /notes 10/11/2012    Past Surgical History  Procedure Laterality Date  . Abdominal aortic aneurysm repair  2013  . Pr vein bypass graft,aorto-fem-pop    . Esophagogastroduodenoscopy  06/10/2011    Procedure: ESOPHAGOGASTRODUODENOSCOPY (EGD);  Surgeon: Jeryl Columbia, MD;  Location: [facility] ENDOSCOPY;  Service: Endoscopy;  Laterality: N/A;  . Colonoscopy with propofol  07/19/2012    Procedure: COLONOSCOPY WITH PROPOFOL;  Surgeon: Arta Silence, MD;  Location: [facility] ENDOSCOPY;  Service: Endoscopy;  Laterality: N/A;  polyp lifter needed  . Colonoscopy with propofol N/A 01/03/2013    Procedure: COLONOSCOPY  WITH PROPOFOL;  Surgeon: Arta Silence, MD;  Location: [facility] ENDOSCOPY;  Service: Endoscopy;  Laterality: N/A;  . Flexible sigmoidoscopy N/A 01/07/2013    Procedure: FLEXIBLE SIGMOIDOSCOPY;  Surgeon: Missy Sabins, MD;  Location: [facility] ENDOSCOPY;  Service: Endoscopy;  Laterality: N/A;  . Colonoscopy N/A 01/08/2013    Procedure: COLONOSCOPY;  Surgeon: Cleotis Nipper, MD;  Location: [facility] ENDOSCOPY;  Service: Endoscopy;  Laterality: N/A;    Family History  Problem Relation Age of Onset  . Cancer Mother     breast  . Hypertension Father   . Diabetes Sister   . COPD Brother    Social History:  reports that he has been smoking Cigarettes.  He has a 30 pack-year smoking history. He has never used smokeless tobacco. He reports that he does not drink alcohol or use illicit drugs.  Allergies:  Allergies  Allergen Reactions  . Levofloxacin Other (See Comments)    HEMOLYTIC ANEMIA    Medications:                                                                                                                           I have reviewed  the patient's current medications.  ROS: unable to obtain due to mental status                                                                                                                                      History obtained from chart review  Physical exam: pleasant male in no apparent distress. BP 160/80 P 72 R 17, SpO2 100.00%. Afebrile Head: normocephalic. Neck: supple, no bruits, no JVD. Cardiac: no murmurs. Lungs: clear. Abdomen: soft, no tender, no mass. Extremities: no edema. CV: pulses palpable throughout  Neurologic Examination:                                                                                                      Mental Status: Alert, awake, disoriented to place and year.  Dysarthric.  Able to follow simple commands without difficulty. Cranial Nerves: II: Discs flat bilaterally; Visual fields grossly normal, pupils equal, round, reactive to light and accommodation III,IV, VI: ptosis not present, extra-ocular motions intact bilaterally V,VII: smile asymmetric with right face weakness, facial light touch sensation impaired right face VIII: hearing normal bilaterally IX,X: gag reflex present XI: bilateral shoulder shrug XII: midline tongue extension without atrophy or fasciculations Motor: Dense right HP Tone diminished in the right Sensory: Pinprick and light touch intact impaired in the right side. Deep Tendon Reflexes:  1+ all over Plantars: Right: downgoing   Left: downgoing Cerebellar: finger-to-nose and heel-to-shin impaired in the right most likely due to significant weakness. Gait:  No tested.    Results for orders placed during the hospital encounter of 17-Nov-2013 (from the past 48 hour(s))  PROTIME-INR     Status: None   Collection Time    11-17-2013  8:45 AM      Result Value Ref Range   Prothrombin Time 14.6  11.6 - 15.2 seconds   INR 1.16  0.00 - 1.49  APTT     Status: None   Collection Time    11-17-2013  8:45 AM      Result Value Ref  Range   aPTT 34  24 - 37 seconds  CBC     Status: Abnormal (Preliminary result)   Collection Time    17-Nov-2013  8:45 AM      Result Value Ref Range   WBC 9.5  4.0 - 10.5 K/uL   RBC 3.60 (*) 4.22 - 5.81 MIL/uL   Hemoglobin 10.2 (*) 13.0 - 17.0 g/dL   HCT 29.4 (*) 39.0 - 52.0 %   MCV 81.7  78.0 - 100.0 fL   MCH 28.3  26.0 - 34.0 pg   MCHC 34.7  30.0 - 36.0 g/dL   RDW 14.3  11.5 - 15.5 %   Platelets PENDING  150 - 400 K/uL  DIFFERENTIAL     Status: Abnormal   Collection Time    Nov 17, 2013  8:45 AM      Result Value Ref Range   Neutrophils Relative % 64  43 - 77 %   Neutro Abs 6.1  1.7 - 7.7 K/uL   Lymphocytes Relative 21  12 - 46 %   Lymphs Abs 2.0  0.7 - 4.0 K/uL   Monocytes Relative 14 (*) 3 - 12 %   Monocytes Absolute 1.3 (*) 0.1 - 1.0 K/uL   Eosinophils Relative 1  0 - 5 %   Eosinophils Absolute 0.1  0.0 - 0.7 K/uL   Basophils Relative 0  0 - 1 %   Basophils Absolute 0.0  0.0 - 0.1 K/uL  I-STAT TROPOININ, ED     Status: None   Collection Time    2013/11/17  8:55 AM      Result Value Ref Range   Troponin i, poc 0.01  0.00 - 0.08 ng/mL   Comment 3            Comment: Due to the release kinetics of cTnI,     a negative result within the first hours     of the onset of symptoms does not rule out     myocardial infarction with certainty.     If myocardial infarction is still suspected,     repeat the test at appropriate intervals.  I-STAT CHEM 8, ED     Status: Abnormal   Collection Time    11/17/13  8:58 AM      Result Value Ref Range   Sodium 141  137 - 147 mEq/L   Potassium 3.8  3.7 - 5.3 mEq/L   Chloride 115 (*) 96 - 112 mEq/L   BUN 25 (*) 6 - 23 mg/dL   Creatinine, Ser 2.90 (*) 0.50 - 1.35 mg/dL   Glucose, Bld 128 (*) 70 - 99 mg/dL   Calcium, Ion 1.20  1.13 - 1.30 mmol/L   TCO2 21  0 - 100 mmol/L   Hemoglobin 10.5 (*) 13.0 - 17.0 g/dL   HCT 31.0 (*) 39.0 - 52.0 %  CBG MONITORING, ED     Status: Abnormal   Collection Time    11/02/2013  9:04 AM      Result Value Ref  Range   Glucose-Capillary 113 (*) 70 - 99 mg/dL   Ct Head Wo Contrast  Nov 02, 2013   CLINICAL DATA:  Code stroke:  Right sided symptoms  EXAM: CT HEAD WITHOUT CONTRAST  TECHNIQUE: Contiguous axial images were obtained from the base of the skull through the vertex without intravenous contrast.  COMPARISON:  Head CT 10/11/2012  FINDINGS: Interval development of intraparenchymal hemorrhage in the left subinsular cortex with local mass effect compressing the adjacent basal ganglia. The hemorrhage measures 5.3 x 2.8 cm in greatest dimension. There is effacement of the left lateral ventricle but no midline shift. Mild global cerebral atrophy. Periventricular, subcortical and deep white matter hypoattenuation consistent with longstanding microvascular white matter disease. Symmetric bilateral leftward gaze in the orbits. No acute soft tissue or calvarial abnormality. Small 1.2 cm circumscribed low-attenuation nodule in the soft tissues of the antral lateral left cheek at the inferior margin of the zygomatic arch. There is associated surrounding vasogenic edema.  IMPRESSION: 1. Acute left sub insular intraparenchymal hemorrhage with surrounding vasogenic edema and local mass effect. No midline shift. 2. Background of atrophy and mild chronic microvascular ischemic white matter disease. 3. Small 1.2 cm dermoid or epidermoid cyst in the superficial soft tissues of the left antrerolateral cheek at the inferior aspect of the zygomatic arch. Critical Value/emergent results were called by telephone at the time of interpretation on 11-02-2013 at 9:09 AM to Dr. Veryl Speak , who verbally acknowledged these results.   Electronically Signed   By: Jacqulynn Cadet M.D.   On: 2013-11-02 09:07    Assessment: 79 y.o. male with large acute left sub insular intraparenchymal hemorrhage with surrounding vasogenic edema and local mass effect. No midline shift. No intraventricular extension. Mild thrombocytopenia, INR and PTT within  normal range. He was not reported to be particularly hypertensive but the location of the hemorrhage and the fact he has very mild thrombocytopenia makes me thing that this is most likely a hypertensive ICH.  Admit to NICU. Follow up CT in am or other neuro-imaging as per stroke team. Goal SBP<160. D/C antiplatelets. I had an ample conversation with family regarding the potential complications and prognosis of ICH. I stressed to them that he may require intubation sooner than later in order to protect his airway and they are in agreement. Stroke team will resume care in am.   Stroke Risk Factors - age, hyperlipidemia, HTN.   Dorian Pod, MD [facility] [phone]  Nov 02, 2013, 9:14 AM

## 2013-10-26 NOTE — Progress Notes (Signed)
At [place] patient was noted to have decreasing oxygen saturation. Nursing provided comfort measures for the patient as the patient was designated as DNR earlier in the day. At approximately 1937 asystole was noted on the monitor. Heart and lung sounds were determined to be absent by two nurses listening independently. Time of death was noted as 1940 and Dr. Doy Mince was notified accordingly. The stoke sustained by the patient was not compatible with life. Comfort was provided to the to family and friends. Nursing will continue to follow as needed by the family.  Terri Piedra, RN

## 2013-10-26 DEATH — deceased

## 2013-11-08 ENCOUNTER — Ambulatory Visit: Payer: Medicare Other

## 2013-11-08 ENCOUNTER — Other Ambulatory Visit: Payer: Medicare Other

## 2014-01-31 NOTE — Telephone Encounter (Signed)
Noted  

## 2014-07-11 ENCOUNTER — Encounter (HOSPITAL_COMMUNITY): Payer: Self-pay | Admitting: Gastroenterology
